# Patient Record
Sex: Female | Born: 1965 | Race: White | Hispanic: No | Marital: Married | Smoking: Never smoker
Health system: Southern US, Community
[De-identification: ages and names within clinical notes are randomized; demographics above are authoritative.]

## PROBLEM LIST (undated history)

## (undated) DIAGNOSIS — E785 Hyperlipidemia, unspecified: Secondary | ICD-10-CM

## (undated) DIAGNOSIS — G43909 Migraine, unspecified, not intractable, without status migrainosus: Secondary | ICD-10-CM

## (undated) DIAGNOSIS — R0600 Dyspnea, unspecified: Secondary | ICD-10-CM

## (undated) DIAGNOSIS — R0602 Shortness of breath: Secondary | ICD-10-CM

## (undated) DIAGNOSIS — K2100 Gastro-esophageal reflux disease with esophagitis, without bleeding: Secondary | ICD-10-CM

## (undated) DIAGNOSIS — J45909 Unspecified asthma, uncomplicated: Secondary | ICD-10-CM

## (undated) DIAGNOSIS — N920 Excessive and frequent menstruation with regular cycle: Secondary | ICD-10-CM

## (undated) DIAGNOSIS — R928 Other abnormal and inconclusive findings on diagnostic imaging of breast: Secondary | ICD-10-CM

## (undated) DIAGNOSIS — I10 Essential (primary) hypertension: Secondary | ICD-10-CM

## (undated) HISTORY — DX: Excessive and frequent menstruation with regular cycle: N92.0

## (undated) HISTORY — DX: Hyperlipidemia, unspecified: E78.5

## (undated) HISTORY — DX: Gastro-esophageal reflux disease with esophagitis: K21.0

## (undated) HISTORY — PX: EYE SURGERY: SHX253

## (undated) HISTORY — PX: CATARACT EXTRACTION, BILATERAL: SHX1313

## (undated) HISTORY — DX: Gastro-esophageal reflux disease with esophagitis, without bleeding: K21.00

## (undated) HISTORY — DX: Unspecified asthma, uncomplicated: J45.909

## (undated) HISTORY — DX: Other abnormal and inconclusive findings on diagnostic imaging of breast: R92.8

## (undated) HISTORY — PX: TUBAL LIGATION: SHX77

## (undated) HISTORY — PX: COLON SURGERY: SHX602

## (undated) HISTORY — DX: Essential (primary) hypertension: I10

## (undated) HISTORY — DX: Migraine, unspecified, not intractable, without status migrainosus: G43.909

---

## 2002-09-19 HISTORY — PX: PARTIAL HYSTERECTOMY: SHX80

## 2002-09-19 HISTORY — PX: ABDOMINAL HYSTERECTOMY: SHX81

## 2004-11-24 ENCOUNTER — Ambulatory Visit: Payer: Self-pay | Admitting: Surgery

## 2005-01-13 ENCOUNTER — Ambulatory Visit: Payer: Self-pay | Admitting: Surgery

## 2005-06-14 ENCOUNTER — Ambulatory Visit: Payer: Self-pay | Admitting: General Practice

## 2007-10-18 ENCOUNTER — Ambulatory Visit: Payer: Self-pay

## 2008-07-16 ENCOUNTER — Ambulatory Visit: Payer: Self-pay

## 2008-09-19 HISTORY — PX: BREAST EXCISIONAL BIOPSY: SUR124

## 2009-06-11 ENCOUNTER — Ambulatory Visit: Payer: Self-pay

## 2009-06-18 ENCOUNTER — Ambulatory Visit: Payer: Self-pay

## 2009-12-16 ENCOUNTER — Ambulatory Visit: Payer: Self-pay

## 2010-01-28 ENCOUNTER — Emergency Department: Payer: Self-pay | Admitting: Emergency Medicine

## 2010-01-30 ENCOUNTER — Emergency Department: Payer: Self-pay | Admitting: Emergency Medicine

## 2010-07-07 ENCOUNTER — Ambulatory Visit: Payer: Self-pay

## 2011-07-18 ENCOUNTER — Ambulatory Visit: Payer: Self-pay

## 2012-07-18 ENCOUNTER — Ambulatory Visit: Payer: Self-pay

## 2012-11-20 DIAGNOSIS — L709 Acne, unspecified: Secondary | ICD-10-CM | POA: Insufficient documentation

## 2012-11-20 DIAGNOSIS — G4700 Insomnia, unspecified: Secondary | ICD-10-CM | POA: Insufficient documentation

## 2012-11-20 DIAGNOSIS — J9801 Acute bronchospasm: Secondary | ICD-10-CM | POA: Insufficient documentation

## 2012-11-20 DIAGNOSIS — Z8249 Family history of ischemic heart disease and other diseases of the circulatory system: Secondary | ICD-10-CM | POA: Insufficient documentation

## 2013-07-19 ENCOUNTER — Ambulatory Visit: Payer: Self-pay

## 2014-05-01 ENCOUNTER — Ambulatory Visit: Payer: Self-pay | Admitting: Family Medicine

## 2015-04-20 ENCOUNTER — Ambulatory Visit
Admission: RE | Admit: 2015-04-20 | Discharge: 2015-04-20 | Disposition: A | Discharge status: Home / Self Care | Payer: BLUE CROSS/BLUE SHIELD | Source: Ambulatory Visit | Attending: Nurse Practitioner | Admitting: Nurse Practitioner

## 2015-04-20 ENCOUNTER — Other Ambulatory Visit: Payer: Self-pay | Admitting: Nurse Practitioner

## 2015-04-20 DIAGNOSIS — M25511 Pain in right shoulder: Secondary | ICD-10-CM | POA: Insufficient documentation

## 2015-04-20 DIAGNOSIS — M47892 Other spondylosis, cervical region: Secondary | ICD-10-CM | POA: Insufficient documentation

## 2015-04-20 DIAGNOSIS — M542 Cervicalgia: Secondary | ICD-10-CM

## 2015-08-03 ENCOUNTER — Other Ambulatory Visit: Payer: Self-pay | Admitting: *Deleted

## 2015-08-03 ENCOUNTER — Inpatient Hospital Stay
Admission: RE | Admit: 2015-08-03 | Discharge: 2015-08-03 | Disposition: A | Discharge status: Home / Self Care | Payer: Self-pay | Source: Ambulatory Visit | Attending: *Deleted | Admitting: *Deleted

## 2015-08-03 ENCOUNTER — Other Ambulatory Visit: Payer: Self-pay | Admitting: Unknown Physician Specialty

## 2015-08-03 DIAGNOSIS — R928 Other abnormal and inconclusive findings on diagnostic imaging of breast: Secondary | ICD-10-CM

## 2015-08-03 DIAGNOSIS — Z9289 Personal history of other medical treatment: Secondary | ICD-10-CM

## 2015-08-05 LAB — HM PAP SMEAR: HM Pap smear: NEGATIVE

## 2015-08-20 ENCOUNTER — Ambulatory Visit
Admission: RE | Admit: 2015-08-20 | Discharge: 2015-08-20 | Disposition: A | Discharge status: Home / Self Care | Payer: BLUE CROSS/BLUE SHIELD | Source: Ambulatory Visit | Attending: Unknown Physician Specialty | Admitting: Unknown Physician Specialty

## 2015-08-20 DIAGNOSIS — R928 Other abnormal and inconclusive findings on diagnostic imaging of breast: Secondary | ICD-10-CM

## 2015-08-20 DIAGNOSIS — R921 Mammographic calcification found on diagnostic imaging of breast: Secondary | ICD-10-CM | POA: Insufficient documentation

## 2015-08-24 ENCOUNTER — Other Ambulatory Visit: Payer: Self-pay | Admitting: Unknown Physician Specialty

## 2015-08-24 DIAGNOSIS — R921 Mammographic calcification found on diagnostic imaging of breast: Secondary | ICD-10-CM

## 2015-08-26 ENCOUNTER — Ambulatory Visit
Admission: RE | Admit: 2015-08-26 | Discharge: 2015-08-26 | Disposition: A | Discharge status: Home / Self Care | Payer: BLUE CROSS/BLUE SHIELD | Source: Ambulatory Visit | Attending: Unknown Physician Specialty | Admitting: Unknown Physician Specialty

## 2015-08-26 DIAGNOSIS — R921 Mammographic calcification found on diagnostic imaging of breast: Secondary | ICD-10-CM

## 2015-08-26 HISTORY — PX: BREAST BIOPSY: SHX20

## 2015-08-27 LAB — SURGICAL PATHOLOGY

## 2016-09-16 ENCOUNTER — Other Ambulatory Visit: Payer: Self-pay | Admitting: Obstetrics & Gynecology

## 2016-09-16 DIAGNOSIS — Z1231 Encounter for screening mammogram for malignant neoplasm of breast: Secondary | ICD-10-CM

## 2016-09-19 HISTORY — PX: EYE SURGERY: SHX253

## 2016-10-04 ENCOUNTER — Ambulatory Visit: Payer: BLUE CROSS/BLUE SHIELD

## 2016-10-26 ENCOUNTER — Ambulatory Visit
Admission: RE | Admit: 2016-10-26 | Discharge: 2016-10-26 | Disposition: A | Discharge status: Home / Self Care | Payer: BLUE CROSS/BLUE SHIELD | Source: Ambulatory Visit | Attending: Obstetrics & Gynecology | Admitting: Obstetrics & Gynecology

## 2016-10-26 DIAGNOSIS — Z1231 Encounter for screening mammogram for malignant neoplasm of breast: Secondary | ICD-10-CM | POA: Diagnosis not present

## 2016-10-26 LAB — HM MAMMOGRAPHY

## 2017-05-31 HISTORY — PX: OTHER SURGICAL HISTORY: SHX169

## 2017-09-04 ENCOUNTER — Other Ambulatory Visit: Payer: Self-pay

## 2017-09-07 NOTE — Telephone Encounter (Signed)
Left pt message that she needs to make appointment to get changes in her medication.  dbs

## 2017-09-08 ENCOUNTER — Ambulatory Visit (INDEPENDENT_AMBULATORY_CARE_PROVIDER_SITE_OTHER): Payer: BLUE CROSS/BLUE SHIELD | Admitting: Nurse Practitioner

## 2017-09-08 ENCOUNTER — Encounter: Payer: Self-pay | Admitting: Obstetrics & Gynecology

## 2017-09-08 ENCOUNTER — Encounter: Payer: Self-pay | Admitting: Nurse Practitioner

## 2017-09-08 VITALS — BP 131/91 | HR 85 | Resp 16 | Ht 69.0 in | Wt 224.4 lb

## 2017-09-08 DIAGNOSIS — E049 Nontoxic goiter, unspecified: Secondary | ICD-10-CM

## 2017-09-08 DIAGNOSIS — E069 Thyroiditis, unspecified: Secondary | ICD-10-CM

## 2017-09-08 DIAGNOSIS — N959 Unspecified menopausal and perimenopausal disorder: Secondary | ICD-10-CM | POA: Diagnosis not present

## 2017-09-08 MED ORDER — ESTRADIOL 0.05 MG/24HR TD PTTW: 1.0000 | MEDICATED_PATCH | TRANSDERMAL | 12 refills | Status: DC

## 2017-09-08 NOTE — Progress Notes (Signed)
Subjective:     Patient ID: Colleen Richard, female   DOB: 11-03-1965, 51 y.o.   MRN: [id number]  Started having night sweats again. 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 Having sweats about every 2 hours at night. Now starting to have hot flashes during the day as well. Currently, does not have prescriptoin for hormone patches.  Would like to go back on weight loss medication. Feels like she may be relased to participate in exercise after her follow up with retina specialist. Feels like she may have put on 15 pounds since this whole situation started.     Current Outpatient Medications:  .  albuterol (PROVENTIL HFA;VENTOLIN HFA) 108 (90 Base) MCG/ACT inhaler, Inhale 2 puffs into the lungs every 6 (six) hours as needed for wheezing or shortness of breath., Disp: , Rfl:  .  aspirin-acetaminophen-caffeine (EXCEDRIN MIGRAINE) 250-250-65 MG tablet, Take 2 tablets by mouth every 6 (six) hours as needed for headache., Disp: , Rfl:  .  phentermine 37.5 MG capsule, Take 37.5 mg by mouth every morning., Disp: , Rfl:  .  estradiol (VIVELLE-DOT) 0.05 MG/24HR patch, Place 1 patch (0.05 mg total) onto the skin 2 (two) times a week., Disp: 8 patch, Rfl: 12  Review of Systems  Constitutional: Positive for fatigue.  HENT: Negative.   Eyes: Negative.   Respiratory: Negative.  Negative for chest tightness and shortness of breath.   Cardiovascular: Negative for chest pain and palpitations.  Gastrointestinal: Negative.   Endocrine: Positive for heat intolerance.  Genitourinary: Negative.        Hot flashes and night sweats .  Musculoskeletal: Negative.   Skin: Negative.   Allergic/Immunologic: Negative.   Neurological: Negative.   Hematological: Negative.   Psychiatric/Behavioral: Negative.     Today's Vitals   09/08/17 1530  BP: (!) 131/91  Pulse: 85  Resp: 16  SpO2: 97%  Weight: 224 lb 6.4 oz (101.8 kg)  Height: 5\' 9"  (1.753 m)   Objective:   Physical Exam   Constitutional: She is oriented to person, place, and time. She appears well-developed and well-nourished.  HENT:  Head: Normocephalic and atraumatic.  Eyes: Pupils are equal, round, and reactive to light.  Neck: Normal range of motion. Neck supple. Thyromegaly present.  Cardiovascular: Normal rate and regular rhythm.  Pulmonary/Chest: Effort normal and breath sounds normal.  Abdominal: Soft. There is no tenderness.  Musculoskeletal: Normal range of motion.  Neurological: She is alert and oriented to person, place, and time.  Skin: Skin is warm and dry.  Psychiatric: She has a normal mood and affect.  Nursing note and vitals reviewed.      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 Add vicelle dot 0.05mg /hr every 24 hours. Reassess at next visit   2. U/s thyroid ordered for continued evaluation and monitoring  Follow up 1 month

## 2017-09-15 ENCOUNTER — Encounter: Payer: Self-pay | Admitting: Obstetrics & Gynecology

## 2017-09-18 ENCOUNTER — Ambulatory Visit (INDEPENDENT_AMBULATORY_CARE_PROVIDER_SITE_OTHER): Payer: BLUE CROSS/BLUE SHIELD | Admitting: Obstetrics & Gynecology

## 2017-09-18 ENCOUNTER — Encounter: Payer: Self-pay | Admitting: Obstetrics & Gynecology

## 2017-09-18 VITALS — BP 130/80 | HR 82 | Ht 69.0 in | Wt 223.0 lb

## 2017-09-18 DIAGNOSIS — Z1231 Encounter for screening mammogram for malignant neoplasm of breast: Secondary | ICD-10-CM

## 2017-09-18 DIAGNOSIS — Z1211 Encounter for screening for malignant neoplasm of colon: Secondary | ICD-10-CM

## 2017-09-18 DIAGNOSIS — Z1239 Encounter for other screening for malignant neoplasm of breast: Secondary | ICD-10-CM

## 2017-09-18 DIAGNOSIS — Z01419 Encounter for gynecological examination (general) (routine) without abnormal findings: Secondary | ICD-10-CM

## 2017-09-18 DIAGNOSIS — Z0000 Encounter for general adult medical examination without abnormal findings: Secondary | ICD-10-CM

## 2017-09-18 NOTE — Progress Notes (Signed)
HPI:      [name] is a 51 y.o. [id number] who LMP was in the past, she presents today for her annual examination.  The patient has no complaints today. The patient is sexually active. Herlast pap: approximate date 2016 and was normal and last mammogram: approximate date 10/2016 and was normal.  The patient does perform self breast exams.  There is no notable family history of breast or ovarian cancer in her family. The patient is taking hormone replacement therapy. Patient denies post-menopausal vaginal bleeding.   The patient has regular exercise: yes. The patient denies current symptoms of depression.    GYN Hx: Last Colonoscopy:not yet Last DEXA: never ago.  PMHx: Past Medical History:  Diagnosis Date  . Abnormal mammogram   . Asthma   . Menorrhagia   . Migraines   . Reflux esophagitis    Past Surgical History:  Procedure Laterality Date  . BREAST BIOPSY Bilateral 2010   excisional - neg  . BREAST BIOPSY Right 08/26/2015   stereo  . CATARACT EXTRACTION, BILATERAL  01/2013, 2015  . COLON SURGERY    . EYE SURGERY    . 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 PARTIAL HYSTERECTOMY  2004  . TUBAL LIGATION     Family History  Problem Relation Age of Onset  . COPD Mother   . Asthma Mother   . Atrial fibrillation Mother   . Heart disease Father   . Alzheimer's disease Maternal Grandmother   . Alzheimer's disease Maternal Aunt   . Breast cancer Neg Hx    Social History   Tobacco Use  . Smoking status: Never Smoker  . Smokeless tobacco: Never Used  Substance Use Topics  . Alcohol use: No    Frequency: Never  . Drug use: No    Current Outpatient Medications:  .  albuterol (PROVENTIL HFA;VENTOLIN HFA) 108 (90 Base) MCG/ACT inhaler, Inhale 2 puffs into the lungs every 6 (six) hours as needed for wheezing or shortness of breath., Disp: , Rfl:  .  aspirin-acetaminophen-caffeine (EXCEDRIN MIGRAINE) 250-250-65 MG tablet, Take 2 tablets by mouth every  6 (six) hours as needed for headache., Disp: , Rfl:  .  estradiol (VIVELLE-DOT) 0.05 MG/24HR patch, Place 1 patch (0.05 mg total) onto the skin 2 (two) times a week., Disp: 8 patch, Rfl: 12 .  phentermine 37.5 MG capsule, Take 37.5 mg by mouth every morning., Disp: , Rfl:  Allergies: Patient has no known allergies.  Review of Systems  Constitutional: Negative for chills, fever and malaise/fatigue.  HENT: Negative for congestion, sinus pain and sore throat.   Eyes: Negative for blurred vision and pain.  Respiratory: Negative for cough and wheezing.   Cardiovascular: Negative for chest pain and leg swelling.  Gastrointestinal: Negative for abdominal pain, constipation, diarrhea, heartburn, nausea and vomiting.  Genitourinary: Negative for dysuria, frequency, hematuria and urgency.  Musculoskeletal: Negative for back pain, joint pain, myalgias and neck pain.  Skin: Negative for itching and rash.  Neurological: Negative for dizziness, tremors and weakness.  Endo/Heme/Allergies: Does not bruise/bleed easily.  Psychiatric/Behavioral: Negative for depression. The patient is not nervous/anxious and does not have insomnia.     Objective: BP 130/80   Pulse 82   Ht 5\' 9"  (1.753 m)   Wt 223 lb (101.2 kg)   BMI 32.93 kg/m   Filed Weights   09/18/17 1321  Weight: 223 lb (101.2 kg)   Body mass index is 32.93 kg/m. Physical Exam  Constitutional: She is oriented to person, place, and time. She appears well-developed and well-nourished. No distress.  Genitourinary: Rectum normal and vagina normal. Pelvic exam was performed with patient supine. There is no rash or lesion on the right labia. There is no rash or lesion on the left labia. Vagina exhibits no lesion. No bleeding in the vagina. Right adnexum does not display mass and does not display tenderness. Left adnexum does not display mass and does not display tenderness.  Genitourinary Comments: Absent Uterus Absent cervix Vaginal cuff well  healed  HENT:  Head: Normocephalic and atraumatic. Head is without laceration.  Right Ear: Hearing normal.  Left Ear: Hearing normal.  Nose: No epistaxis.  No foreign bodies.  Mouth/Throat: Uvula is midline, oropharynx is clear and moist and mucous membranes are normal.  Eyes: Pupils are equal, round, and reactive to light.  Neck: Normal range of motion. Neck supple. No thyromegaly present.  Cardiovascular: Normal rate and regular rhythm. Exam reveals no gallop and no friction rub.  No murmur heard. Pulmonary/Chest: Effort normal and breath sounds normal. No respiratory distress. She has no wheezes. Right breast exhibits no mass, no skin change and no tenderness. Left breast exhibits no mass, no skin change and no tenderness.  Abdominal: Soft. Bowel sounds are normal. She exhibits no distension. There is no tenderness. There is no rebound.  Musculoskeletal: Normal range of motion.  Neurological: She is alert and oriented to person, place, and time. No cranial nerve deficit.  Skin: Skin is warm and dry.  Psychiatric: She has a normal mood and affect. Judgment normal.  Vitals reviewed.  Assessment: Annual Exam 1. Screening for breast cancer   2. Screen for colon cancer   3. Annual physical exam    Plan:            1.  Cervical Screening-  Pap smear schedule reviewed with patient  2. Breast screening- Exam annually and mammogram scheduled  3. Colonoscopy every 10 years (referral made), Hemoccult testing after age 51  4. Labs managed by PCP  5. Counseling for hormonal therapy: no change in therapy today; cont ERT patch    F/U  Return in about 1 year (around 09/18/2018) for Annual.  Barnett Applebaum, MD, [facility], [facility] 09/18/2017  1:42 PM

## 2017-09-18 NOTE — Patient Instructions (Signed)
PAP every three years Mammogram every year    Call [phone] to schedule at [place] Colonoscopy every 10 years Labs yearly (with PCP) 

## 2017-10-02 ENCOUNTER — Telehealth: Payer: Self-pay

## 2017-10-02 NOTE — Telephone Encounter (Signed)
Gastroenterology Pre-Procedure Review  Request Date: Requesting Physician: Dr. Bonna Gains  PATIENT REVIEW QUESTIONS: The patient responded to the following health history questions as indicated:    1. Are you having any GI issues?  2. Do you have a personal history of Polyps? No  3. Do you have a family history of Colon Cancer or Polyps? No  4. Diabetes Mellitus?  No  5. Joint replacements in the past 12 months? No  6. Major health problems in the past 3 months? Yes, detached retina 05/2017 7. Any artificial heart valves, MVP, or defibrillator? No     MEDICATIONS & ALLERGIES:    Patient reports the following regarding taking any anticoagulation/antiplatelet therapy:   Plavix, Coumadin, Eliquis, Xarelto, Lovenox, Pradaxa, Brilinta, or Effient? No  Aspirin? No   Patient confirms/reports the following medications:  Current Outpatient Medications  Medication Sig Dispense Refill  . albuterol (PROVENTIL HFA;VENTOLIN HFA) 108 (90 Base) MCG/ACT inhaler Inhale 2 puffs into the lungs every 6 (six) hours as needed for wheezing or shortness of breath.    Marland Kitchen aspirin-acetaminophen-caffeine (EXCEDRIN MIGRAINE) 250-250-65 MG tablet Take 2 tablets by mouth every 6 (six) hours as needed for headache.    . estradiol (VIVELLE-DOT) 0.05 MG/24HR patch Place 1 patch (0.05 mg total) onto the skin 2 (two) times a week. 8 patch 12  . phentermine 37.5 MG capsule Take 37.5 mg by mouth every morning.     No current facility-administered medications for this visit.     Patient confirms/reports the following allergies:  No Known Allergies  No orders of the defined types were placed in this encounter.   AUTHORIZATION INFORMATION Primary Insurance: 1D#: Group #:  Secondary Insurance: 1D#: Group #:  SCHEDULE INFORMATION: Date: 11/03/17 Time: Location: [place]

## 2017-10-06 ENCOUNTER — Other Ambulatory Visit: Payer: Self-pay

## 2017-10-06 ENCOUNTER — Telehealth: Payer: Self-pay

## 2017-10-06 DIAGNOSIS — Z1211 Encounter for screening for malignant neoplasm of colon: Secondary | ICD-10-CM

## 2017-10-06 NOTE — Telephone Encounter (Signed)
Gastroenterology Pre-Procedure Review  Request Date: 11/03/17 Requesting Physician: Dr. Bonna Gains  PATIENT REVIEW QUESTIONS: The patient responded to the following health history questions as indicated:    1. Are you having any GI issues? no 2. Do you have a personal history of Polyps? no 3. Do you have a family history of Colon Cancer or Polyps? no 4. Diabetes Mellitus? no 5. Joint replacements in the past 12 months?no 6. Major health problems in the past 3 months?yes (05/31/17 retinal detachment) 7. Any artificial heart valves, MVP, or defibrillator?no    MEDICATIONS & ALLERGIES:    Patient reports the following regarding taking any anticoagulation/antiplatelet therapy:   Plavix, Coumadin, Eliquis, Xarelto, Lovenox, Pradaxa, Brilinta, or Effient? no Aspirin? no  Patient confirms/reports the following medications:  Current Outpatient Medications  Medication Sig Dispense Refill  . albuterol (PROVENTIL HFA;VENTOLIN HFA) 108 (90 Base) MCG/ACT inhaler Inhale 2 puffs into the lungs every 6 (six) hours as needed for wheezing or shortness of breath.    Marland Kitchen aspirin-acetaminophen-caffeine (EXCEDRIN MIGRAINE) 250-250-65 MG tablet Take 2 tablets by mouth every 6 (six) hours as needed for headache.    . estradiol (VIVELLE-DOT) 0.05 MG/24HR patch Place 1 patch (0.05 mg total) onto the skin 2 (two) times a week. 8 patch 12  . phentermine 37.5 MG capsule Take 37.5 mg by mouth every morning.     No current facility-administered medications for this visit.     Patient confirms/reports the following allergies:  No Known Allergies  No orders of the defined types were placed in this encounter.   AUTHORIZATION INFORMATION Primary Insurance: 1D#: Group #:  Secondary Insurance: 1D#: Group #:  SCHEDULE INFORMATION: Date: 11/03/17 Time: Location:[facility]

## 2017-10-09 ENCOUNTER — Other Ambulatory Visit: Payer: BLUE CROSS/BLUE SHIELD

## 2017-10-09 DIAGNOSIS — E049 Nontoxic goiter, unspecified: Secondary | ICD-10-CM | POA: Diagnosis not present

## 2017-10-09 DIAGNOSIS — E069 Thyroiditis, unspecified: Secondary | ICD-10-CM | POA: Diagnosis not present

## 2017-10-16 ENCOUNTER — Ambulatory Visit (INDEPENDENT_AMBULATORY_CARE_PROVIDER_SITE_OTHER): Payer: BLUE CROSS/BLUE SHIELD | Admitting: Nurse Practitioner

## 2017-10-16 ENCOUNTER — Encounter: Payer: Self-pay | Admitting: Nurse Practitioner

## 2017-10-16 ENCOUNTER — Telehealth: Payer: Self-pay

## 2017-10-16 VITALS — BP 138/80 | HR 78 | Resp 16 | Ht 69.0 in | Wt 220.0 lb

## 2017-10-16 DIAGNOSIS — N959 Unspecified menopausal and perimenopausal disorder: Secondary | ICD-10-CM

## 2017-10-16 DIAGNOSIS — M19011 Primary osteoarthritis, right shoulder: Secondary | ICD-10-CM

## 2017-10-16 DIAGNOSIS — J0140 Acute pansinusitis, unspecified: Secondary | ICD-10-CM | POA: Diagnosis not present

## 2017-10-16 DIAGNOSIS — E040 Nontoxic diffuse goiter: Secondary | ICD-10-CM

## 2017-10-16 MED ORDER — PREDNISONE 10 MG (21) PO TBPK: ORAL_TABLET | ORAL | 0 refills | Status: DC

## 2017-10-16 MED ORDER — AZITHROMYCIN 250 MG PO TABS: ORAL_TABLET | ORAL | 0 refills | Status: DC

## 2017-10-16 NOTE — Progress Notes (Addendum)
[facility] [place], [place] [address]  Internal MEDICINE  Office Visit Note  Patient Name: Colleen Richard  [id number]  [id number]  Date of Service: 10/16/2017  Chief Complaint  Patient presents with  . Shoulder Pain    right     Increased vivelle dot at her last visit due to increased hot flashes and night sweats. Increased dose is helping symptoms.  She has also had ultrasound of her thyroid for monitoring purposes since she was last seen. She is here to discuss results.    Shoulder Pain   The pain is present in the right shoulder. This is a recurrent problem. The current episode started in the past 7 days. There has been no history of extremity trauma. The problem occurs intermittently. The problem has been unchanged. The quality of the pain is described as aching. The pain is at a severity of 8/10. The pain is severe. Associated symptoms include a limited range of motion. Pertinent negatives include no fever. The symptoms are aggravated by activity and cold. She has tried cold and NSAIDS for the symptoms. The treatment provided no relief. Family history includes rheumatoid arthritis. Her past medical history is significant for osteoarthritis.    Pt is here for routine follow up.    Current Medication: Outpatient Encounter Medications as of 10/16/2017  Medication Sig  . albuterol (PROVENTIL HFA;VENTOLIN HFA) 108 (90 Base) MCG/ACT inhaler Inhale 2 puffs into the lungs every 6 (six) hours as needed for wheezing or shortness of breath.  Marland Kitchen aspirin-acetaminophen-caffeine (EXCEDRIN MIGRAINE) 250-250-65 MG tablet Take 2 tablets by mouth every 6 (six) hours as needed for headache.  . estradiol (VIVELLE-DOT) 0.05 MG/24HR patch Place 1 patch (0.05 mg total) onto the skin 2 (two) times a week.  . phentermine 37.5 MG capsule Take 37.5 mg by mouth every morning.   No facility-administered encounter medications on file as of 10/16/2017.     Surgical History: Past  Surgical History:  Procedure Laterality Date  . BREAST BIOPSY Bilateral 2010   excisional - neg  . BREAST BIOPSY Right 08/26/2015   stereo  . CATARACT EXTRACTION, BILATERAL  01/2013, 2015  . COLON SURGERY    . EYE SURGERY    . 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 PARTIAL HYSTERECTOMY  2004  . TUBAL LIGATION      Medical History: Past Medical History:  Diagnosis Date  . Abnormal mammogram   . Asthma   . Menorrhagia   . Migraines   . Reflux esophagitis     Family History: Family History  Problem Relation Age of Onset  . COPD Mother   . Asthma Mother   . Atrial fibrillation Mother   . Heart disease Father   . Alzheimer's disease Maternal Grandmother   . Alzheimer's disease Maternal Aunt   . Breast cancer Neg Hx     Social History   Socioeconomic History  . Marital status: Married    Spouse name: Not on file  . Number of children: Not on file  . Years of education: Not on file  . Highest education level: Not on file  Social Needs  . Financial resource strain: Not on file  . Food insecurity - worry: Not on file  . Food insecurity - inability: Not on file  . Transportation needs - medical: Not on file  . Transportation needs - non-medical: Not on file  Occupational History  . Not on file  Tobacco Use  .  Smoking status: Never Smoker  . Smokeless tobacco: Never Used  Substance and Sexual Activity  . Alcohol use: No    Frequency: Never  . Drug use: No  . Sexual activity: Yes  Other Topics Concern  . Not on file  Social History Narrative  . Not on file      Review of Systems  Constitutional: Positive for fatigue. Negative for fever.  HENT: Positive for congestion, postnasal drip, rhinorrhea, sore throat and voice change.   Eyes: Negative.   Respiratory: Positive for cough.   Cardiovascular: Negative for chest pain, palpitations and leg swelling.  Gastrointestinal: Negative for constipation, diarrhea, nausea and vomiting.    Endocrine:       Increased dose of vivelle dot patch. Doing very well with this dosing.   Musculoskeletal: Positive for arthralgias.       Right shoulder pain, taking ibuprofen and aleve has not helped.   Allergic/Immunologic: Positive for environmental allergies.  Neurological: Positive for headaches.  Hematological: Negative for adenopathy. Does not bruise/bleed easily.    Today's Vitals   10/16/17 1015  BP: 138/80  Pulse: 78  Resp: 16  SpO2: 98%  Weight: 220 lb (99.8 kg)  Height: 5\' 9"  (1.753 m)    Physical Exam  Constitutional: She is oriented to person, place, and time. She appears well-developed and well-nourished. No distress.  HENT:  Head: Normocephalic and atraumatic.  Right Ear: Tympanic membrane is erythematous and bulging.  Left Ear: Tympanic membrane, external ear and ear canal normal.  Nose: Rhinorrhea present. Right sinus exhibits frontal sinus tenderness. Left sinus exhibits frontal sinus tenderness.  Mouth/Throat: Posterior oropharyngeal edema present. No oropharyngeal exudate.  Eyes: EOM are normal. Pupils are equal, round, and reactive to light.  Neck: Normal range of motion. Neck supple. No JVD present. No tracheal deviation present. No thyromegaly present.  Cardiovascular: Normal rate, regular rhythm and normal heart sounds. Exam reveals no gallop and no friction rub.  No murmur heard. Pulmonary/Chest: Effort normal and breath sounds normal. No respiratory distress. She has no wheezes. She has no rales. She exhibits no tenderness.  Abdominal: Soft. Bowel sounds are normal. There is no tenderness.  Musculoskeletal:       Arms: Lymphadenopathy:    She has no cervical adenopathy.  Neurological: She is alert and oriented to person, place, and time. No cranial nerve deficit.  Skin: Skin is warm and dry. She is not diaphoretic.  Psychiatric: She has a normal mood and affect. Her behavior is normal. Judgment and thought content normal.  Nursing note and vitals  reviewed.   Assessment/Plan:  1. Primary osteoarthritis of right shoulder Continue to use NSAIDs as needed and as prescribed.  - predniSONE (STERAPRED UNI-PAK 21 TAB) 10 MG (21) TBPK tablet; 6 day taper - take by mouth as directed for 6 days  Dispense: 21 tablet; Refill: 0  2. Acute non-recurrent pansinusitis - azithromycin (ZITHROMAX) 250 MG tablet; z-pack - take as directed for 5 days  Dispense: 6 tablet; Refill: 0 OTC medications should be used to relieve symptoms.   3. Unspecified menopausal and perimenopausal disorder Improved. Continue estrogen patch at 0.05mg /24hours  4. Diffuse goiter -  Reviewed results of thyroid ultrasound. Left lobe nodule measuring 1.8cm at largest diameter and right thyroid lobe has two small nodules at 102mm in diameter. Essentially unchanged from prior ultrasound. Will repeat in 1 year.    General Counseling: artemis koller understanding of the findings of todays visit and agrees with plan of treatment. I have discussed  any further diagnostic evaluation that may be needed or ordered today. We also reviewed her medications today. she has been encouraged to call the office with any questions or concerns that should arise related to todays visit.  This patient was seen by Leretha Pol, FNP- C in Collaboration with Dr Lavera Guise as a part of collaborative care agreement    Time spent: 71 Minutes          Dr Lavera Guise Internal medicine

## 2017-10-16 NOTE — Telephone Encounter (Signed)
Called pt to let her know about thyroid results

## 2017-10-16 NOTE — Telephone Encounter (Signed)
-----   Message from Ronnell Freshwater, NP sent at 10/16/2017  1:49 PM EST ----- Hey. Can you let the patient know that her thyroid ultrasound is stable. right side nodule measuring 1.8cm at largest diameter. Two smaller nodulus on left side, both measuring about 2mm in diameter. All about the same as u/s done I 2017. Will repeat in 1 year. thanks

## 2017-10-27 ENCOUNTER — Encounter: Payer: Self-pay | Admitting: Obstetrics & Gynecology

## 2017-10-27 ENCOUNTER — Ambulatory Visit
Admission: RE | Admit: 2017-10-27 | Discharge: 2017-10-27 | Disposition: A | Discharge status: Home / Self Care | Payer: BLUE CROSS/BLUE SHIELD | Source: Ambulatory Visit | Attending: Obstetrics & Gynecology | Admitting: Obstetrics & Gynecology

## 2017-10-27 DIAGNOSIS — Z1231 Encounter for screening mammogram for malignant neoplasm of breast: Secondary | ICD-10-CM | POA: Insufficient documentation

## 2017-10-27 DIAGNOSIS — Z1239 Encounter for other screening for malignant neoplasm of breast: Secondary | ICD-10-CM

## 2017-10-28 ENCOUNTER — Other Ambulatory Visit: Payer: Self-pay | Admitting: Nurse Practitioner

## 2017-10-28 DIAGNOSIS — M19011 Primary osteoarthritis, right shoulder: Secondary | ICD-10-CM

## 2017-10-30 ENCOUNTER — Telehealth: Payer: Self-pay | Admitting: Gastroenterology

## 2017-10-30 MED ORDER — PREDNISONE 10 MG (21) PO TBPK: ORAL_TABLET | ORAL | 0 refills | Status: DC

## 2017-10-30 NOTE — Telephone Encounter (Signed)
Patient called and is having shoulder pain. PCP put her on steroids. She is having a procedure on Friday and Dr. Marius Ditch said ok to start taking steroid and continue on with procedure.

## 2017-11-02 ENCOUNTER — Encounter: Payer: Self-pay | Admitting: *Deleted

## 2017-11-03 ENCOUNTER — Ambulatory Visit: Payer: BLUE CROSS/BLUE SHIELD | Admitting: Anesthesiology

## 2017-11-03 ENCOUNTER — Encounter
Admission: RE | Disposition: A | Discharge status: Home / Self Care | Payer: Self-pay | Source: Ambulatory Visit | Attending: Gastroenterology

## 2017-11-03 ENCOUNTER — Ambulatory Visit
Admission: RE | Admit: 2017-11-03 | Discharge: 2017-11-03 | Disposition: A | Discharge status: Home / Self Care | Payer: BLUE CROSS/BLUE SHIELD | Source: Ambulatory Visit | Attending: Gastroenterology | Admitting: Gastroenterology

## 2017-11-03 ENCOUNTER — Encounter: Payer: Self-pay | Admitting: Anesthesiology

## 2017-11-03 DIAGNOSIS — K529 Noninfective gastroenteritis and colitis, unspecified: Secondary | ICD-10-CM | POA: Insufficient documentation

## 2017-11-03 DIAGNOSIS — J45909 Unspecified asthma, uncomplicated: Secondary | ICD-10-CM | POA: Diagnosis not present

## 2017-11-03 DIAGNOSIS — Z6832 Body mass index (BMI) 32.0-32.9, adult: Secondary | ICD-10-CM | POA: Insufficient documentation

## 2017-11-03 DIAGNOSIS — Z79899 Other long term (current) drug therapy: Secondary | ICD-10-CM | POA: Insufficient documentation

## 2017-11-03 DIAGNOSIS — K6289 Other specified diseases of anus and rectum: Secondary | ICD-10-CM | POA: Diagnosis not present

## 2017-11-03 DIAGNOSIS — Z8249 Family history of ischemic heart disease and other diseases of the circulatory system: Secondary | ICD-10-CM | POA: Diagnosis not present

## 2017-11-03 DIAGNOSIS — K210 Gastro-esophageal reflux disease with esophagitis: Secondary | ICD-10-CM | POA: Insufficient documentation

## 2017-11-03 DIAGNOSIS — Z1211 Encounter for screening for malignant neoplasm of colon: Secondary | ICD-10-CM | POA: Insufficient documentation

## 2017-11-03 DIAGNOSIS — D122 Benign neoplasm of ascending colon: Secondary | ICD-10-CM | POA: Diagnosis not present

## 2017-11-03 DIAGNOSIS — Z1239 Encounter for other screening for malignant neoplasm of breast: Secondary | ICD-10-CM

## 2017-11-03 DIAGNOSIS — E669 Obesity, unspecified: Secondary | ICD-10-CM | POA: Diagnosis not present

## 2017-11-03 HISTORY — PX: COLONOSCOPY WITH PROPOFOL: SHX5780

## 2017-11-03 SURGERY — COLONOSCOPY WITH PROPOFOL
Anesthesia: General

## 2017-11-03 MED ORDER — PROPOFOL 10 MG/ML IV BOLUS
INTRAVENOUS | Status: AC
  Filled 2017-11-03: qty 20

## 2017-11-03 MED ORDER — LIDOCAINE HCL (PF) 1 % IJ SOLN
INTRAMUSCULAR | Status: AC
  Administered 2017-11-03: 0.3 mL via INTRADERMAL
  Filled 2017-11-03: qty 2

## 2017-11-03 MED ORDER — PROPOFOL 500 MG/50ML IV EMUL
INTRAVENOUS | Status: AC
  Filled 2017-11-03: qty 50

## 2017-11-03 MED ORDER — LIDOCAINE HCL (PF) 1 % IJ SOLN
2.0000 mL | Freq: Once | INTRAMUSCULAR | Status: AC
  Administered 2017-11-03: 0.3 mL via INTRADERMAL

## 2017-11-03 MED ORDER — SPOT INK MARKER SYRINGE KIT
PACK | SUBMUCOSAL | Status: DC | PRN
  Administered 2017-11-03: 4 mL via SUBMUCOSAL

## 2017-11-03 MED ORDER — MIDAZOLAM HCL 2 MG/2ML IJ SOLN
INTRAMUSCULAR | Status: DC | PRN
  Administered 2017-11-03: 2 mg via INTRAVENOUS

## 2017-11-03 MED ORDER — MIDAZOLAM HCL 2 MG/2ML IJ SOLN
INTRAMUSCULAR | Status: AC
  Filled 2017-11-03: qty 2

## 2017-11-03 MED ORDER — FENTANYL CITRATE (PF) 100 MCG/2ML IJ SOLN
INTRAMUSCULAR | Status: DC | PRN
  Administered 2017-11-03: 50 ug via INTRAVENOUS

## 2017-11-03 MED ORDER — PROPOFOL 500 MG/50ML IV EMUL
INTRAVENOUS | Status: DC | PRN
  Administered 2017-11-03: 120 ug/kg/min via INTRAVENOUS

## 2017-11-03 MED ORDER — FENTANYL CITRATE (PF) 100 MCG/2ML IJ SOLN
INTRAMUSCULAR | Status: AC
  Filled 2017-11-03: qty 2

## 2017-11-03 MED ORDER — SODIUM CHLORIDE 0.9 % IV SOLN
INTRAVENOUS | Status: DC
  Administered 2017-11-03: 1000 mL via INTRAVENOUS

## 2017-11-03 NOTE — Anesthesia Post-op Follow-up Note (Signed)
Anesthesia QCDR form completed.        

## 2017-11-03 NOTE — Anesthesia Preprocedure Evaluation (Signed)
Anesthesia Evaluation  Patient identified by MRN, date of birth, ID band Patient awake    Reviewed: Allergy & Precautions, NPO status , Patient's Chart, lab work & pertinent test results, reviewed documented beta blocker date and time   Airway Mallampati: III  TM Distance: >3 FB     Dental  (+) Chipped   Pulmonary asthma ,           Cardiovascular      Neuro/Psych  Headaches,    GI/Hepatic   Endo/Other    Renal/GU      Musculoskeletal   Abdominal   Peds  Hematology   Anesthesia Other Findings Obese.  Reproductive/Obstetrics                             Anesthesia Physical Anesthesia Plan  ASA: II  Anesthesia Plan: General   Post-op Pain Management:    Induction: Intravenous  PONV Risk Score and Plan:   Airway Management Planned:   Additional Equipment:   Intra-op Plan:   Post-operative Plan:   Informed Consent: I have reviewed the patients History and Physical, chart, labs and discussed the procedure including the risks, benefits and alternatives for the proposed anesthesia with the patient or authorized representative who has indicated his/her understanding and acceptance.     Plan Discussed with: CRNA  Anesthesia Plan Comments:         Anesthesia Quick Evaluation

## 2017-11-03 NOTE — Op Note (Signed)
[facility] Gastroenterology Patient Name: Colleen Richard Procedure Date: 11/03/2017 9:33 AM MRN: [id number] Account #: [id number] Date of Birth: 06/30/1966 Admit Type: Outpatient Age: 52 Room: [facility] ENDO ROOM 3 Gender: Female Note Status: Finalized Procedure:            Colonoscopy Indications:          Screening for colorectal malignant neoplasm Providers:            Demita Tobia B. Bonna Gains MD, MD Referring MD:         Forest Gleason Md, MD (Referring MD) Medicines:            Monitored Anesthesia Care Complications:        No immediate complications. Procedure:            Pre-Anesthesia Assessment:                       - ASA Grade Assessment: II - A patient with mild                        systemic disease.                       - Prior to the procedure, a History and Physical was                        performed, and patient medications, allergies and                        sensitivities were reviewed. The patient's tolerance of                        previous anesthesia was reviewed.                       - The risks and benefits of the procedure and the                        sedation options and risks were discussed with the                        patient. All questions were answered and informed                        consent was obtained.                       - Patient identification and proposed procedure were                        verified prior to the procedure by the physician, the                        nurse, the anesthesiologist, the anesthetist and the                        technician. The procedure was verified in the procedure                        room.  After obtaining informed consent, the colonoscope was                        passed under direct vision. Throughout the procedure,                        the patient's blood pressure, pulse, and oxygen                        saturations were monitored continuously. The                 Colonoscope was introduced through the anus and                        advanced to the the cecum, identified by appendiceal                        orifice and ileocecal valve. The colonoscopy was                        performed with ease. The patient tolerated the                        procedure well. The quality of the bowel preparation                        was good except the ascending colon was fair. Findings:      The perianal and digital rectal examinations were normal.      A 5 mm polyp was found in the ascending colon. The polyp was flat. The       polyp was removed with a jumbo cold forceps. Resection and retrieval       were complete. Area was tattooed with an injection of 3 mL of Spot       (carbon black). Due to the position of the polyp at a sharp turn it       could not repositioned to the 6 'o' clock position, to be removed via       cold snare and thus was removed piecemeal with jumbo forceps instead.      A localized area of mildly erythematous mucosa was found in the rectum.       Biopsies were taken with a cold forceps for histology.      Retroflexion could not be performed due to narrow rectum. Careful       frontal view of the rectum did not show any abnormalities besides mild       rectal erythema.      The exam was otherwise without abnormality. Impression:           - One 5 mm polyp in the ascending colon, removed with a                        jumbo cold forceps. Resected and retrieved. Tattooed.                       - Erythematous mucosa in the rectum. Biopsied.                       - Retroflexion could not be performed due to narrow  rectum. Careful frontal view of the rectum did not show                        any abnormalities besides mild rectal erythema.                       - The examination was otherwise normal. Recommendation:       - Discharge patient to home (with escort).                       - Advance diet as  tolerated.                       - Continue present medications.                       - Await pathology results.                       - The findings and recommendations were discussed with                        the patient.                       - The findings and recommendations were discussed with                        the patient's family.                       - Return to primary care physician as previously                        scheduled.                       - Repeat colonoscopy for surveillance based on                        pathology results. Due to fair prep in the right colon                        would recommend no later than 5 years for surveillance. Procedure Code(s):    --- Professional ---                       [phone], Colonoscopy, flexible; with directed submucosal                        injection(s), any substance                       84696, Colonoscopy, flexible; with biopsy, single or                        multiple Diagnosis Code(s):    --- Professional ---                       Z12.11, Encounter for screening for malignant neoplasm                        of colon  D12.2, Benign neoplasm of ascending colon                       K62.89, Other specified diseases of anus and rectum CPT copyright 2016 American Medical Association. All rights reserved. The codes documented in this report are preliminary and upon coder review may  be revised to meet current compliance requirements.  Vonda Antigua, MD Margretta Sidle B. Bonna Gains MD, MD 11/03/2017 11:04:54 AM This report has been signed electronically. Number of Addenda: 0 Note Initiated On: 11/03/2017 9:33 AM Scope Withdrawal Time: 0 hours 52 minutes 11 seconds  Total Procedure Duration: 1 hour 3 minutes 41 seconds  Estimated Blood Loss: Estimated blood loss: none.      [facility]

## 2017-11-03 NOTE — H&P (Signed)
Vonda Antigua, MD [address] [place], [place], [place], [address] [id number], [place], [place], [place], [address] Phone: [phone]  Fax: [phone]  Primary Care Physician:  Ronnell Freshwater, NP   Pre-Procedure History & Physical: HPI:  Colleen Richard is a 52 y.o. female is here for a colonoscopy.   Past Medical History:  Diagnosis Date  . Abnormal mammogram   . Asthma   . Menorrhagia   . Migraines   . Reflux esophagitis     Past Surgical History:  Procedure Laterality Date  . BREAST BIOPSY Bilateral 2010   excisional - neg  . BREAST BIOPSY Right 08/26/2015   stereo  . CATARACT EXTRACTION, BILATERAL  01/2013, 2015  . COLON SURGERY    . EYE SURGERY    . 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 PARTIAL HYSTERECTOMY  2004  . TUBAL LIGATION      Prior to Admission medications   Medication Sig Start Date End Date Taking? Authorizing Provider  albuterol (PROVENTIL HFA;VENTOLIN HFA) 108 (90 Base) MCG/ACT inhaler Inhale 2 puffs into the lungs every 6 (six) hours as needed for wheezing or shortness of breath.    [provider]  aspirin-acetaminophen-caffeine (EXCEDRIN MIGRAINE) [phone] MG tablet Take 2 tablets by mouth every 6 (six) hours as needed for headache.    [provider]  azithromycin (ZITHROMAX) 250 MG tablet z-pack - take as directed for 5 days 10/16/17   Ronnell Freshwater, NP  estradiol (VIVELLE-DOT) 0.05 MG/24HR patch Place 1 patch (0.05 mg total) onto the skin 2 (two) times a week. 09/11/17   Ronnell Freshwater, NP  predniSONE (STERAPRED UNI-PAK 21 TAB) 10 MG (21) TBPK tablet 6 day taper - take by mouth as directed for 6 days 10/30/17   Ronnell Freshwater, NP    Allergies as of 10/09/2017  . (No Known Allergies)    Family History  Problem Relation Age of Onset  . COPD Mother   . Asthma Mother   . Atrial fibrillation Mother   . Heart disease Father   . Alzheimer's disease Maternal Grandmother   .  Alzheimer's disease Maternal Aunt   . Breast cancer Neg Hx     Social History   Socioeconomic History  . Marital status: Married    Spouse name: Not on file  . Number of children: Not on file  . Years of education: Not on file  . Highest education level: Not on file  Social Needs  . Financial resource strain: Not on file  . Food insecurity - worry: Not on file  . Food insecurity - inability: Not on file  . Transportation needs - medical: Not on file  . Transportation needs - non-medical: Not on file  Occupational History  . Not on file  Tobacco Use  . Smoking status: Never Smoker  . Smokeless tobacco: Never Used  Substance and Sexual Activity  . Alcohol use: No    Frequency: Never  . Drug use: No  . Sexual activity: Yes  Other Topics Concern  . Not on file  Social History Narrative  . Not on file    Review of Systems: See HPI, otherwise negative ROS  Physical Exam: BP 127/90   Pulse 67   Temp (!) 96.1 F (35.6 C) (Tympanic)   Resp 17   Ht 5\' 9"  (1.753 m)   Wt 218 lb (98.9 kg)   SpO2 99%   BMI 32.19 kg/m  General:  Alert,  pleasant and cooperative in NAD Head:  Normocephalic and atraumatic. Neck:  Supple; no masses or thyromegaly. Lungs:  Clear throughout to auscultation, normal respiratory effort.    Heart:  +S1, +S2, Regular rate and rhythm, No edema. Abdomen:  Soft, nontender and nondistended. Normal bowel sounds, without guarding, and without rebound.   Neurologic:  Alert and  oriented x4;  grossly normal neurologically.  Impression/Plan: MAYTHE DERAMO is here for a colonoscopy to be performed for average risk screening.  Risks, benefits, limitations, and alternatives regarding  colonoscopy have been reviewed with the patient.  Questions have been answered.  All parties agreeable.   Virgel Manifold, MD  11/03/2017, 9:30 AM

## 2017-11-03 NOTE — Transfer of Care (Signed)
Immediate Anesthesia Transfer of Care Note  Patient: Colleen Richard  Procedure(s) Performed: COLONOSCOPY WITH PROPOFOL (N/A )  Patient Location: PACU  Anesthesia Type:General  Level of Consciousness: awake and sedated  Airway & Oxygen Therapy: Patient Spontanous Breathing and Patient connected to nasal cannula oxygen  Post-op Assessment: Report given to RN and Post -op Vital signs reviewed and stable  Post vital signs: Reviewed and stable  Last Vitals:  Vitals:   11/03/17 0819  BP: 127/90  Pulse: 67  Resp: 17  Temp: (!) 35.6 C  SpO2: 99%    Last Pain:  Vitals:   11/03/17 0819  TempSrc: Tympanic         Complications: No apparent anesthesia complications

## 2017-11-03 NOTE — Anesthesia Procedure Notes (Signed)
Performed by: Cook-Martin, Taylinn Brabant Pre-anesthesia Checklist: Patient identified, Emergency Drugs available, Suction available, Patient being monitored and Timeout performed Patient Re-evaluated:Patient Re-evaluated prior to induction Oxygen Delivery Method: Nasal cannula Preoxygenation: Pre-oxygenation with 100% oxygen Induction Type: IV induction Placement Confirmation: positive ETCO2 and CO2 detector       

## 2017-11-03 NOTE — Anesthesia Postprocedure Evaluation (Signed)
Anesthesia Post Note  Patient: Colleen Richard  Procedure(s) Performed: COLONOSCOPY WITH PROPOFOL (N/A )  Patient location during evaluation: Endoscopy Anesthesia Type: General Level of consciousness: awake and alert Pain management: pain level controlled Vital Signs Assessment: post-procedure vital signs reviewed and stable Respiratory status: spontaneous breathing, nonlabored ventilation, respiratory function stable and patient connected to nasal cannula oxygen Cardiovascular status: blood pressure returned to baseline and stable Postop Assessment: no apparent nausea or vomiting Anesthetic complications: no     Last Vitals:  Vitals:   11/03/17 0819  BP: 127/90  Pulse: 67  Resp: 17  Temp: (!) 35.6 C  SpO2: 99%    Last Pain:  Vitals:   11/03/17 0819  TempSrc: Tympanic                 [name]

## 2017-11-06 ENCOUNTER — Encounter: Payer: Self-pay | Admitting: Gastroenterology

## 2017-11-06 LAB — SURGICAL PATHOLOGY

## 2017-11-13 ENCOUNTER — Other Ambulatory Visit: Payer: Self-pay

## 2017-11-13 ENCOUNTER — Encounter: Payer: Self-pay | Admitting: Gastroenterology

## 2017-11-13 ENCOUNTER — Ambulatory Visit (INDEPENDENT_AMBULATORY_CARE_PROVIDER_SITE_OTHER): Payer: BLUE CROSS/BLUE SHIELD | Admitting: Gastroenterology

## 2017-11-13 VITALS — BP 133/85 | HR 67 | Temp 97.8°F | Ht 69.0 in | Wt 222.8 lb

## 2017-11-13 DIAGNOSIS — K6289 Other specified diseases of anus and rectum: Secondary | ICD-10-CM

## 2017-11-13 NOTE — Progress Notes (Signed)
Vonda Antigua, MD [address]  [place]  [place], [place] [address]  Main: [phone]  Fax: [phone]   Primary Care Physician: Ronnell Freshwater, NP  Primary Gastroenterologist:  Dr. Vonda Antigua  Chief Complaint  Patient presents with  . Follow-up    had colonoscopy on 11/03/17    HPI: Colleen Richard is a 52 y.o. female here for follow-up after colonoscopy.  Patient denies any abdominal pain, diarrhea, blood in stool, weight loss, loss of appetite.  Patient reports on a regular since she has, 1-2 formed to hard bowel movements daily.  About every other month she will have loose stools without blood.  He does not have any chronic history of abdominal pain, abdominal cramping, chronic loose stools or blood streaks in stool.  No family history of IBD or colon cancer.  Reports good appetite.  No fever or chills  Patient had a average risk screening colonoscopy on November 03, 2017.  A flat 5 mm polyp was seen in the ascending colon.  It was removed piecemeal with jumbo forceps.  A tattoo was placed at the site.  Mild rectal erythema was seen and was biopsied.  Biopsy showed sessile serrated adenoma.  Rectal biopsies reported mild active colitis. "Architectural features of  chronicity are minimally present and include crypt irregularities and  focal crypt dropout/erosion. Granulomas are not identified."  I discussed the biopsies with Dr. Quay Burow of pathology, and she stated that the features of chronicity are very minimal, and is not consistent with IBD.  Current Outpatient Medications  Medication Sig Dispense Refill  . albuterol (PROVENTIL HFA;VENTOLIN HFA) 108 (90 Base) MCG/ACT inhaler Inhale 2 puffs into the lungs every 6 (six) hours as needed for wheezing or shortness of breath.    Marland Kitchen aspirin-acetaminophen-caffeine (EXCEDRIN MIGRAINE) 250-250-65 MG tablet Take 2 tablets by mouth every 6 (six) hours as needed for headache.    . estradiol (VIVELLE-DOT)  0.05 MG/24HR patch Place 1 patch (0.05 mg total) onto the skin 2 (two) times a week. 8 patch 12   No current facility-administered medications for this visit.     Allergies as of 11/13/2017  . (No Known Allergies)    ROS:  General: Negative for anorexia, weight loss, fever, chills, fatigue, weakness. ENT: Negative for hoarseness, difficulty swallowing , nasal congestion. CV: Negative for chest pain, angina, palpitations, dyspnea on exertion, peripheral edema.  Respiratory: Negative for dyspnea at rest, dyspnea on exertion, cough, sputum, wheezing.  GI: See history of present illness. GU:  Negative for dysuria, hematuria, urinary incontinence, urinary frequency, nocturnal urination.  Endo: Negative for unusual weight change.    Physical Examination:   BP 133/85   Pulse 67   Temp 97.8 F (36.6 C) (Oral)   Ht '5\' 9"'  (1.753 m)   Wt 222 lb 12.8 oz (101.1 kg)   BMI 32.90 kg/m   General: Well-nourished, well-developed in no acute distress.  Eyes: No icterus. Conjunctivae pink. Mouth: Oropharyngeal mucosa moist and pink , no lesions erythema or exudate. Neck: Supple, Trachea midline Abdomen: Bowel sounds are normal, nontender, nondistended, no hepatosplenomegaly or masses, no abdominal bruits or hernia , no rebound or guarding.   Extremities: No lower extremity edema. No clubbing or deformities. Neuro: Alert and oriented x 3.  Grossly intact. Skin: Warm and dry, no jaundice.   Psych: Alert and cooperative, normal mood and affect.   Labs: CMP  No results found for: NA, K, CL, CO2, GLUCOSE, BUN, CREATININE, CALCIUM, PROT, ALBUMIN, AST, ALT,  ALKPHOS, BILITOT, GFRNONAA, GFRAA No results found for: WBC, HGB, HCT, MCV, PLT  Imaging Studies: Mm Screening Breast Tomo Bilateral  Result Date: 10/27/2017 CLINICAL DATA:  Screening. EXAM: DIGITAL SCREENING BILATERAL MAMMOGRAM WITH TOMO AND CAD COMPARISON:  Previous exam(s). ACR Breast Density Category b: There are scattered areas of  fibroglandular density. FINDINGS: There are no findings suspicious for malignancy. Images were processed with CAD. IMPRESSION: No mammographic evidence of malignancy. A result letter of this screening mammogram will be mailed directly to the patient. RECOMMENDATION: Screening mammogram in one year. (Code:SM-B-01Y) BI-RADS CATEGORY  1: Negative. Electronically Signed   By: Abelardo Diesel M.D.   On: 10/27/2017 09:48    Assessment and Plan:   Colleen Richard is a 52 y.o. y/o female here for follow-up of allergy screening colonoscopy, with mild erythema in the rectum, with biopsies showing mild active colitis, with minimal features of chronicity, and discussion with pathologist revealed that this is unlikely to be IBD  This patient's clinical symptoms, endoscopic findings, and biopsies are not convincing for IBD Minimal erythema seen on rectum and biopsy findings were likely due to prep effect versus constipation The patient does not have any clinical symptoms at this time that are concerning We will obtain blood work including ESR and CRP  Given her piecemeal polypectomy, but I discussed the need for repeat colonoscopy with this patient to reevaluate the site.  Patient would like to wait to schedule the procedure due to insurance issues.  At the time of the repeat colonoscopy, rectal can be biopsied again, along with random biopsies of the colon to compare histology from recent colonoscopy  I will follow-up with her in clinic, and when she is ready we will schedule her for the colonoscopy.  In the meantime, if symptoms change, she develops loose stools, or blood streaks in stools, abdominal pain, or any other reason for concern, I have asked her to contact us immediately.  She verbalized understanding  No alarm symptoms present at this time I have asked her to maintain a high-fiber diet and soft stools daily Advised her to start taking MiraLAX or Metamucil every day or every other day to maintain  soft stool  Dr Vonda Antigua

## 2017-11-13 NOTE — Patient Instructions (Signed)
F/U 6 months  High-Fiber Diet Fiber, also called dietary fiber, is a type of carbohydrate found in fruits, vegetables, whole grains, and beans. A high-fiber diet can have many health benefits. Your health care provider may recommend a high-fiber diet to help:  Prevent constipation. Fiber can make your bowel movements more regular.  Lower your cholesterol.  Relieve hemorrhoids, uncomplicated diverticulosis, or irritable bowel syndrome.  Prevent overeating as part of a weight-loss plan.  Prevent heart disease, type 2 diabetes, and certain cancers.  What is my plan? The recommended daily intake of fiber includes:  38 grams for men under age 51.  93 grams for men over age 56.  32 grams for women under age 77.  10 grams for women over age 5.  You can get the recommended daily intake of dietary fiber by eating a variety of fruits, vegetables, grains, and beans. Your health care provider may also recommend a fiber supplement if it is not possible to get enough fiber through your diet. What do I need to know about a high-fiber diet?  Fiber supplements have not been widely studied for their effectiveness, so it is better to get fiber through food sources.  Always check the fiber content on thenutrition facts label of any prepackaged food. Look for foods that contain at least 5 grams of fiber per serving.  Ask your dietitian if you have questions about specific foods that are related to your condition, especially if those foods are not listed in the following section.  Increase your daily fiber consumption gradually. Increasing your intake of dietary fiber too quickly may cause bloating, cramping, or gas.  Drink plenty of water. Water helps you to digest fiber. What foods can I eat? Grains Whole-grain breads. Multigrain cereal. Oats and oatmeal. Brown rice. Barley. Bulgur wheat. [place]. Bran muffins. Popcorn. Rye wafer crackers. Vegetables Sweet potatoes. Spinach. Kale. Artichokes.  Cabbage. Broccoli. Green peas. Carrots. Squash. Fruits Berries. Pears. Apples. Oranges. Avocados. Prunes and raisins. Dried figs. Meats and Other Protein Sources Navy, kidney, pinto, and soy beans. Split peas. Lentils. Nuts and seeds. Dairy Fiber-fortified yogurt. Beverages Fiber-fortified soy milk. Fiber-fortified orange juice. Other Fiber bars. The items listed above may not be a complete list of recommended foods or beverages. Contact your dietitian for more options. What foods are not recommended? Grains White bread. Pasta made with refined flour. White rice. Vegetables Fried potatoes. Canned vegetables. Well-cooked vegetables. Fruits Fruit juice. Cooked, strained fruit. Meats and Other Protein Sources Fatty cuts of meat. Fried Sales executive or fried fish. Dairy Milk. Yogurt. Cream cheese. Sour cream. Beverages Soft drinks. Other Cakes and pastries. Butter and oils. The items listed above may not be a complete list of foods and beverages to avoid. Contact your dietitian for more information. What are some tips for including high-fiber foods in my diet?  Eat a wide variety of high-fiber foods.  Make sure that half of all grains consumed each day are whole grains.  Replace breads and cereals made from refined flour or white flour with whole-grain breads and cereals.  Replace white rice with brown rice, bulgur wheat, or millet.  Start the day with a breakfast that is high in fiber, such as a cereal that contains at least 5 grams of fiber per serving.  Use beans in place of meat in soups, salads, or pasta.  Eat high-fiber snacks, such as berries, raw vegetables, nuts, or popcorn. This information is not intended to replace advice given to you by your health care provider. Make  sure you discuss any questions you have with your health care provider. Document Released: 09/05/2005 Document Revised: 02/11/2016 Document Reviewed: 02/18/2014 Elsevier Interactive Patient Education   [employer].

## 2017-11-15 LAB — C-REACTIVE PROTEIN: CRP: 5.5 mg/L — AB (ref 0.0–4.9)

## 2017-11-15 LAB — COMPREHENSIVE METABOLIC PANEL
ALK PHOS: 68 IU/L (ref 39–117)
ALT: 13 IU/L (ref 0–32)
AST: 26 IU/L (ref 0–40)
Albumin/Globulin Ratio: 1.5 (ref 1.2–2.2)
Albumin: 3.8 g/dL (ref 3.5–5.5)
BUN/Creatinine Ratio: 18 (ref 9–23)
BUN: 14 mg/dL (ref 6–24)
Bilirubin Total: 0.2 mg/dL (ref 0.0–1.2)
CALCIUM: 9.2 mg/dL (ref 8.7–10.2)
CO2: 20 mmol/L (ref 20–29)
CREATININE: 0.76 mg/dL (ref 0.57–1.00)
Chloride: 104 mmol/L (ref 96–106)
GFR calc Af Amer: 106 mL/min/{1.73_m2} (ref >59)
GFR, EST NON AFRICAN AMERICAN: 92 mL/min/{1.73_m2} (ref >59)
GLOBULIN, TOTAL: 2.5 g/dL (ref 1.5–4.5)
GLUCOSE: 83 mg/dL (ref 65–99)
Potassium: 4.3 mmol/L (ref 3.5–5.2)
SODIUM: 141 mmol/L (ref 134–144)
Total Protein: 6.3 g/dL (ref 6.0–8.5)

## 2017-11-15 LAB — CBC WITH DIFFERENTIAL/PLATELET
BASOS ABS: 0 10*3/uL (ref 0.0–0.2)
Basos: 0 %
EOS (ABSOLUTE): 0.1 10*3/uL (ref 0.0–0.4)
EOS: 1 %
HEMATOCRIT: 39.9 % (ref 34.0–46.6)
Hemoglobin: 13.5 g/dL (ref 11.1–15.9)
Immature Grans (Abs): 0 10*3/uL (ref 0.0–0.1)
Immature Granulocytes: 0 %
LYMPHS ABS: 2.2 10*3/uL (ref 0.7–3.1)
Lymphs: 28 %
MCH: 29.3 pg (ref 26.6–33.0)
MCHC: 33.8 g/dL (ref 31.5–35.7)
MCV: 87 fL (ref 79–97)
MONOS ABS: 0.6 10*3/uL (ref 0.1–0.9)
Monocytes: 8 %
Neutrophils Absolute: 5 10*3/uL (ref 1.4–7.0)
Neutrophils: 63 %
PLATELETS: 225 10*3/uL (ref 150–379)
RBC: 4.6 x10E6/uL (ref 3.77–5.28)
RDW: 13.5 % (ref 12.3–15.4)
WBC: 8 10*3/uL (ref 3.4–10.8)

## 2017-11-15 LAB — SEDIMENTATION RATE: SED RATE: 10 mm/h (ref 0–40)

## 2017-11-17 ENCOUNTER — Encounter: Payer: Self-pay | Admitting: Nurse Practitioner

## 2017-11-23 ENCOUNTER — Telehealth: Payer: Self-pay

## 2017-11-23 NOTE — Telephone Encounter (Signed)
Patient called req appt for shoulder pain bc ortho is not covered under her insurance and wants to see [facility] for primary first, advised pt [facility] didn't have anything for a few weekd and offered [facility] appt but pt didn't take it and will possibly find another pcp, also pt not happy of not getting returned messages on [facility] tried to explain our office is still new to [facility] and trying to learn the system/ BR

## 2017-12-19 DIAGNOSIS — M5030 Other cervical disc degeneration, unspecified cervical region: Secondary | ICD-10-CM | POA: Insufficient documentation

## 2017-12-19 DIAGNOSIS — E669 Obesity, unspecified: Secondary | ICD-10-CM | POA: Insufficient documentation

## 2017-12-30 ENCOUNTER — Other Ambulatory Visit: Payer: Self-pay | Admitting: Nurse Practitioner

## 2017-12-30 DIAGNOSIS — M19011 Primary osteoarthritis, right shoulder: Secondary | ICD-10-CM

## 2017-12-30 MED ORDER — PREDNISONE 10 MG (21) PO TBPK: ORAL_TABLET | ORAL | 0 refills | Status: DC

## 2017-12-30 NOTE — Progress Notes (Signed)
Patient states she is still having shoulder pain. Will repeat prednisone taper. Sent to [facility].

## 2018-01-25 ENCOUNTER — Other Ambulatory Visit: Payer: Self-pay | Admitting: Internal Medicine

## 2018-01-25 DIAGNOSIS — E049 Nontoxic goiter, unspecified: Secondary | ICD-10-CM

## 2018-01-25 NOTE — Progress Notes (Signed)
THYR

## 2018-02-09 ENCOUNTER — Other Ambulatory Visit: Payer: Self-pay

## 2018-02-23 ENCOUNTER — Other Ambulatory Visit: Payer: Self-pay

## 2018-02-23 DIAGNOSIS — M19011 Primary osteoarthritis, right shoulder: Secondary | ICD-10-CM

## 2018-02-23 MED ORDER — PREDNISONE 10 MG (21) PO TBPK: ORAL_TABLET | ORAL | 0 refills | Status: DC

## 2018-04-17 ENCOUNTER — Ambulatory Visit: Payer: Self-pay | Admitting: Nurse Practitioner

## 2018-04-27 ENCOUNTER — Ambulatory Visit: Payer: Self-pay | Admitting: Nurse Practitioner

## 2018-04-30 ENCOUNTER — Encounter: Payer: Self-pay | Admitting: Nurse Practitioner

## 2018-04-30 ENCOUNTER — Ambulatory Visit: Payer: Managed Care, Other (non HMO) | Admitting: Nurse Practitioner

## 2018-04-30 VITALS — BP 147/89 | HR 67 | Resp 16 | Ht 69.0 in | Wt 227.2 lb

## 2018-04-30 DIAGNOSIS — E040 Nontoxic diffuse goiter: Secondary | ICD-10-CM | POA: Diagnosis not present

## 2018-04-30 DIAGNOSIS — E668 Other obesity: Secondary | ICD-10-CM

## 2018-04-30 DIAGNOSIS — E559 Vitamin D deficiency, unspecified: Secondary | ICD-10-CM | POA: Diagnosis not present

## 2018-04-30 DIAGNOSIS — Z0001 Encounter for general adult medical examination with abnormal findings: Secondary | ICD-10-CM

## 2018-04-30 DIAGNOSIS — N959 Unspecified menopausal and perimenopausal disorder: Secondary | ICD-10-CM

## 2018-04-30 DIAGNOSIS — M19011 Primary osteoarthritis, right shoulder: Secondary | ICD-10-CM

## 2018-04-30 MED ORDER — PHENTERMINE HCL 37.5 MG PO TABS
37.5000 mg | ORAL_TABLET | Freq: Every day | ORAL | 1 refills | Status: DC
Start: 2018-04-30 — End: 2018-06-18

## 2018-04-30 NOTE — Progress Notes (Signed)
[facility] [place], [place] [address]  Internal MEDICINE  Office Visit Note  Patient Name: Colleen Richard  [id number]  [id number]  Date of Service: 05/13/2018  Chief Complaint  Patient presents with  . Osteoarthritis    right shoulder 6 month follow up  . Weight Gain    7 pounds since most recent viisit.     The patient does have pain, intermittently, in her right shoulder. Exertion will make the pain more severe. When pain acts up, pain improves after taking prednisone taper.  She would like to restart appetite suppressant. Has done well on phentermine in the past. In addition to appetite suppressant, she is able to limit calorie intake and increase exercise, to increase weight loss.       Current Medication: Outpatient Encounter Medications as of 04/30/2018  Medication Sig  . albuterol (PROVENTIL HFA;VENTOLIN HFA) 108 (90 Base) MCG/ACT inhaler Inhale 2 puffs into the lungs every 6 (six) hours as needed for wheezing or shortness of breath.  Marland Kitchen aspirin-acetaminophen-caffeine (EXCEDRIN MIGRAINE) 250-250-65 MG tablet Take 2 tablets by mouth every 6 (six) hours as needed for headache.  . estradiol (VIVELLE-DOT) 0.05 MG/24HR patch Place 1 patch (0.05 mg total) onto the skin 2 (two) times a week.  . zolpidem (AMBIEN) 10 MG tablet Take 10 mg by mouth at bedtime as needed for sleep.  . phentermine (ADIPEX-P) 37.5 MG tablet Take 1 tablet (37.5 mg total) by mouth daily before breakfast.  . predniSONE (STERAPRED UNI-PAK 21 TAB) 10 MG (21) TBPK tablet 6 day taper - take by mouth as directed for 6 days (Patient not taking: Reported on 04/30/2018)   No facility-administered encounter medications on file as of 04/30/2018.     Surgical History: Past Surgical History:  Procedure Laterality Date  . BREAST BIOPSY Bilateral 2010   excisional - neg  . BREAST BIOPSY Right 08/26/2015   stereo  . CATARACT EXTRACTION, BILATERAL  01/2013, 2015  . COLON SURGERY    .  COLONOSCOPY WITH PROPOFOL N/A 11/03/2017   Procedure: COLONOSCOPY WITH PROPOFOL;  Surgeon: Virgel Manifold, MD;  Location: [facility] ENDOSCOPY;  Service: Endoscopy;  Laterality: N/A;  . EYE SURGERY    . 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 PARTIAL HYSTERECTOMY  2004  . TUBAL LIGATION      Medical History: Past Medical History:  Diagnosis Date  . Abnormal mammogram   . Asthma   . Menorrhagia   . Migraines   . Reflux esophagitis     Family History: Family History  Problem Relation Age of Onset  . COPD Mother   . Asthma Mother   . Atrial fibrillation Mother   . Heart disease Father   . Alzheimer's disease Maternal Grandmother   . Alzheimer's disease Maternal Aunt   . Breast cancer Neg Hx     Social History   Socioeconomic History  . Marital status: Married    Spouse name: Not on file  . Number of children: Not on file  . Years of education: Not on file  . Highest education level: Not on file  Occupational History  . Not on file  Social Needs  . Financial resource strain: Not on file  . Food insecurity:    Worry: Not on file    Inability: Not on file  . Transportation needs:    Medical: Not on file    Non-medical: Not on file  Tobacco Use  . Smoking status: Never  Smoker  . Smokeless tobacco: Never Used  Substance and Sexual Activity  . Alcohol use: No    Frequency: Never  . Drug use: No  . Sexual activity: Yes  Lifestyle  . Physical activity:    Days per week: Not on file    Minutes per session: Not on file  . Stress: Not on file  Relationships  . Social connections:    Talks on phone: Not on file    Gets together: Not on file    Attends religious service: Not on file    Active member of club or organization: Not on file    Attends meetings of clubs or organizations: Not on file    Relationship status: Not on file  . Intimate partner violence:    Fear of current or ex partner: Not on file    Emotionally abused: Not on file     Physically abused: Not on file    Forced sexual activity: Not on file  Other Topics Concern  . Not on file  Social History Narrative  . Not on file      Review of Systems  Constitutional: Positive for fatigue. Negative for fever.       Weight gain of 7 pounds since most recent visit.   HENT: Positive for sore throat. Negative for congestion, postnasal drip, rhinorrhea and voice change.   Eyes: Negative.   Respiratory: Negative for cough.   Cardiovascular: Negative for chest pain, palpitations and leg swelling.  Gastrointestinal: Negative for constipation, diarrhea, nausea and vomiting.  Endocrine:       Increased dose of vivelle dot patch. Doing very well with this dosing.   Musculoskeletal: Positive for arthralgias.       Right shoulder pain, taking ibuprofen and aleve has not helped.   Allergic/Immunologic: Positive for environmental allergies.  Neurological: Positive for headaches.  Hematological: Negative for adenopathy. Does not bruise/bleed easily.    Today's Vitals   04/30/18 1541  BP: (!) 147/89  Pulse: 67  Resp: 16  SpO2: 98%  Weight: 227 lb 3.2 oz (103.1 kg)  Height: 5\' 9"  (1.753 m)    Physical Exam  Constitutional: She is oriented to person, place, and time. She appears well-developed and well-nourished. No distress.  HENT:  Head: Normocephalic and atraumatic.  Right Ear: Tympanic membrane, external ear and ear canal normal. Tympanic membrane is not erythematous and not bulging.  Left Ear: Tympanic membrane, external ear and ear canal normal.  Nose: No rhinorrhea. Right sinus exhibits no frontal sinus tenderness. Left sinus exhibits no frontal sinus tenderness.  Mouth/Throat: No oropharyngeal exudate or posterior oropharyngeal edema.  Eyes: Pupils are equal, round, and reactive to light. Conjunctivae and EOM are normal.  Neck: Normal range of motion. Neck supple. No JVD present. No tracheal deviation present. No thyromegaly present.  Cardiovascular: Normal  rate, regular rhythm and normal heart sounds. Exam reveals no gallop and no friction rub.  No murmur heard. Pulmonary/Chest: Effort normal and breath sounds normal. No respiratory distress. She has no wheezes. She has no rales. She exhibits no tenderness.  Abdominal: Soft. Bowel sounds are normal. There is no tenderness.  Musculoskeletal:       Arms: Lymphadenopathy:    She has no cervical adenopathy.  Neurological: She is alert and oriented to person, place, and time. No cranial nerve deficit.  Skin: Skin is warm and dry. She is not diaphoretic.  Psychiatric: She has a normal mood and affect. Her behavior is normal. Judgment and thought content  normal.  Nursing note and vitals reviewed.  Assessment/Plan: 1. Goiter diffuse, nontoxic Thyroid nodule stable. Will check thyroid panel and send results to endocrinology. - T4, free - TSH  2. Moderate obesity Restart phentermine daily. Advised she limit calorie intake to 1200-1500 calories per day. Increase routine physical activity.  - Lipid panel - phentermine (ADIPEX-P) 37.5 MG tablet; Take 1 tablet (37.5 mg total) by mouth daily before breakfast.  Dispense: 30 tablet; Refill: 1  3. Menopausal and perimenopausal disorder Continue vivelle dot as prescribed   4. Vitamin D deficiency - Vitamin D 1,25 dihydroxy  5. Primary osteoarthritis of right shoulder Use OTC medication as needed and as indicated to reduce pain. Will prescribe prednisone taper as needed for acute Pain.     General Counseling: Colleen Richard understanding of the findings of todays visit and agrees with plan of treatment. I have discussed any further diagnostic evaluation that may be needed or ordered today. We also reviewed her medications today. she has been encouraged to call the office with any questions or concerns that should arise related to todays visit.   There is a liability release in patients' chart. There has been a 10 minute discussion about the side  effects including but not limited to elevated blood pressure, anxiety, lack of sleep and dry mouth. Pt understands and will like to start/continue on appetite suppressant at this time. There will be one month RX given at the time of visit with proper follow up. Nova diet plan with restricted calories is given to the pt. Pt understands and agrees with  plan of treatment  This patient was seen by Leretha Pol FNP Collaboration with Dr Lavera Guise as a part of collaborative care agreement  Orders Placed This Encounter  Procedures  . CBC with Differential/Platelet  . Comprehensive metabolic panel  . T4, free  . TSH  . Lipid panel  . Vitamin D 1,25 dihydroxy    Meds ordered this encounter  Medications  . phentermine (ADIPEX-P) 37.5 MG tablet    Sig: Take 1 tablet (37.5 mg total) by mouth daily before breakfast.    Dispense:  30 tablet    Refill:  1    Order Specific Question:   Supervising Provider    Answer:   Lavera Guise [[id number]]    Time spent: 99 Minutes    Dr Lavera Guise Internal medicine

## 2018-05-07 LAB — CBC WITH DIFFERENTIAL/PLATELET
BASOS ABS: 0 10*3/uL (ref 0.0–0.2)
Basos: 0 %
EOS (ABSOLUTE): 0.1 10*3/uL (ref 0.0–0.4)
Eos: 2 %
Hematocrit: 43.5 % (ref 34.0–46.6)
Hemoglobin: 14.5 g/dL (ref 11.1–15.9)
Immature Grans (Abs): 0 10*3/uL (ref 0.0–0.1)
Immature Granulocytes: 0 %
LYMPHS ABS: 1.4 10*3/uL (ref 0.7–3.1)
Lymphs: 31 %
MCH: 29.6 pg (ref 26.6–33.0)
MCHC: 33.3 g/dL (ref 31.5–35.7)
MCV: 89 fL (ref 79–97)
Monocytes Absolute: 0.4 10*3/uL (ref 0.1–0.9)
Monocytes: 9 %
NEUTROS ABS: 2.7 10*3/uL (ref 1.4–7.0)
Neutrophils: 58 %
PLATELETS: 214 10*3/uL (ref 150–450)
RBC: 4.9 x10E6/uL (ref 3.77–5.28)
RDW: 13.1 % (ref 12.3–15.4)
WBC: 4.6 10*3/uL (ref 3.4–10.8)

## 2018-05-07 LAB — COMPREHENSIVE METABOLIC PANEL
ALK PHOS: 71 IU/L (ref 39–117)
ALT: 12 IU/L (ref 0–32)
AST: 21 IU/L (ref 0–40)
Albumin/Globulin Ratio: 2 (ref 1.2–2.2)
Albumin: 4.5 g/dL (ref 3.5–5.5)
BILIRUBIN TOTAL: 0.6 mg/dL (ref 0.0–1.2)
BUN/Creatinine Ratio: 13 (ref 9–23)
BUN: 14 mg/dL (ref 6–24)
CHLORIDE: 102 mmol/L (ref 96–106)
CO2: 23 mmol/L (ref 20–29)
Calcium: 9.2 mg/dL (ref 8.7–10.2)
Creatinine, Ser: 1.05 mg/dL — ABNORMAL HIGH (ref 0.57–1.00)
GFR calc Af Amer: 71 mL/min/{1.73_m2} (ref >59)
GFR calc non Af Amer: 62 mL/min/{1.73_m2} (ref >59)
GLUCOSE: 89 mg/dL (ref 65–99)
Globulin, Total: 2.2 g/dL (ref 1.5–4.5)
Potassium: 4.3 mmol/L (ref 3.5–5.2)
Sodium: 141 mmol/L (ref 134–144)
Total Protein: 6.7 g/dL (ref 6.0–8.5)

## 2018-05-07 LAB — TSH: TSH: 1.21 u[IU]/mL (ref 0.450–4.500)

## 2018-05-07 LAB — LIPID PANEL
CHOLESTEROL TOTAL: 226 mg/dL — AB (ref 100–199)
Chol/HDL Ratio: 3.7 ratio (ref 0.0–4.4)
HDL: 61 mg/dL (ref >39)
LDL Calculated: 144 mg/dL — ABNORMAL HIGH (ref 0–99)
TRIGLYCERIDES: 104 mg/dL (ref 0–149)
VLDL Cholesterol Cal: 21 mg/dL (ref 5–40)

## 2018-05-07 LAB — VITAMIN D 1,25 DIHYDROXY
VITAMIN D 1, 25 (OH) TOTAL: 40 pg/mL
Vitamin D2 1, 25 (OH)2: <10 pg/mL
Vitamin D3 1, 25 (OH)2: 40 pg/mL

## 2018-05-07 LAB — T4, FREE: FREE T4: 1.11 ng/dL (ref 0.82–1.77)

## 2018-05-13 DIAGNOSIS — N959 Unspecified menopausal and perimenopausal disorder: Secondary | ICD-10-CM | POA: Insufficient documentation

## 2018-05-13 DIAGNOSIS — E040 Nontoxic diffuse goiter: Secondary | ICD-10-CM | POA: Insufficient documentation

## 2018-05-13 DIAGNOSIS — M19011 Primary osteoarthritis, right shoulder: Secondary | ICD-10-CM | POA: Insufficient documentation

## 2018-05-13 DIAGNOSIS — E559 Vitamin D deficiency, unspecified: Secondary | ICD-10-CM | POA: Insufficient documentation

## 2018-05-22 ENCOUNTER — Ambulatory Visit: Payer: BLUE CROSS/BLUE SHIELD | Admitting: Gastroenterology

## 2018-05-30 ENCOUNTER — Ambulatory Visit: Payer: BLUE CROSS/BLUE SHIELD | Admitting: Gastroenterology

## 2018-06-14 ENCOUNTER — Ambulatory Visit: Payer: Self-pay | Admitting: Nurse Practitioner

## 2018-06-18 ENCOUNTER — Encounter: Payer: Self-pay | Admitting: Nurse Practitioner

## 2018-06-18 ENCOUNTER — Ambulatory Visit: Payer: Managed Care, Other (non HMO) | Admitting: Nurse Practitioner

## 2018-06-18 VITALS — BP 123/92 | HR 90 | Resp 16 | Ht 69.0 in | Wt 217.4 lb

## 2018-06-18 DIAGNOSIS — E668 Other obesity: Secondary | ICD-10-CM

## 2018-06-18 DIAGNOSIS — N959 Unspecified menopausal and perimenopausal disorder: Secondary | ICD-10-CM | POA: Diagnosis not present

## 2018-06-18 DIAGNOSIS — Z23 Encounter for immunization: Secondary | ICD-10-CM

## 2018-06-18 DIAGNOSIS — F5101 Primary insomnia: Secondary | ICD-10-CM

## 2018-06-18 MED ORDER — PHENTERMINE HCL 37.5 MG PO TABS: 37.5000 mg | ORAL_TABLET | Freq: Every day | ORAL | 1 refills | Status: DC

## 2018-06-18 MED ORDER — ZOLPIDEM TARTRATE 10 MG PO TABS: 10.0000 mg | ORAL_TABLET | Freq: Every evening | ORAL | 3 refills | Status: DC | PRN

## 2018-06-18 NOTE — Progress Notes (Signed)
[facility] [place], [place] [address]  Internal MEDICINE  Office Visit Note  Patient Name: Colleen Richard  [id number]  [id number]  Date of Service: 06/18/2018  Chief Complaint  Patient presents with  . Asthma  . Medical Management of Chronic Issues    weight management     Restarted the patient on phentermine at her last visit. She is taking this most days. She has had a 10 pound weight loss since her last visit. She is reporting no negative side effects from taking this medication. Blood pressure has improved.  She does take ambien 10 mg some nights to help with insomnia. She would like to have a refill for this today.  She needs to have TDAP 09/2018.      Current Medication: Outpatient Encounter Medications as of 06/18/2018  Medication Sig  . albuterol (PROVENTIL HFA;VENTOLIN HFA) 108 (90 Base) MCG/ACT inhaler Inhale 2 puffs into the lungs every 6 (six) hours as needed for wheezing or shortness of breath.  Marland Kitchen aspirin-acetaminophen-caffeine (EXCEDRIN MIGRAINE) 250-250-65 MG tablet Take 2 tablets by mouth every 6 (six) hours as needed for headache.  . estradiol (VIVELLE-DOT) 0.05 MG/24HR patch Place 1 patch (0.05 mg total) onto the skin 2 (two) times a week.  . phentermine (ADIPEX-P) 37.5 MG tablet Take 1 tablet (37.5 mg total) by mouth daily before breakfast.  . zolpidem (AMBIEN) 10 MG tablet Take 1 tablet (10 mg total) by mouth at bedtime as needed for sleep.  . [DISCONTINUED] phentermine (ADIPEX-P) 37.5 MG tablet Take 1 tablet (37.5 mg total) by mouth daily before breakfast.  . [DISCONTINUED] zolpidem (AMBIEN) 10 MG tablet Take 10 mg by mouth at bedtime as needed for sleep.  . [DISCONTINUED] predniSONE (STERAPRED UNI-PAK 21 TAB) 10 MG (21) TBPK tablet 6 day taper - take by mouth as directed for 6 days (Patient not taking: Reported on 04/30/2018)   No facility-administered encounter medications on file as of 06/18/2018.     Surgical History: Past  Surgical History:  Procedure Laterality Date  . BREAST BIOPSY Bilateral 2010   excisional - neg  . BREAST BIOPSY Right 08/26/2015   stereo  . CATARACT EXTRACTION, BILATERAL  01/2013, 2015  . COLON SURGERY    . COLONOSCOPY WITH PROPOFOL N/A 11/03/2017   Procedure: COLONOSCOPY WITH PROPOFOL;  Surgeon: Virgel Manifold, MD;  Location: [facility] ENDOSCOPY;  Service: Endoscopy;  Laterality: N/A;  . EYE SURGERY    . 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 PARTIAL HYSTERECTOMY  2004  . TUBAL LIGATION      Medical History: Past Medical History:  Diagnosis Date  . Abnormal mammogram   . Asthma   . Menorrhagia   . Migraines   . Reflux esophagitis     Family History: Family History  Problem Relation Age of Onset  . COPD Mother   . Asthma Mother   . Atrial fibrillation Mother   . Heart disease Father   . Alzheimer's disease Maternal Grandmother   . Alzheimer's disease Maternal Aunt   . Breast cancer Neg Hx     Social History   Socioeconomic History  . Marital status: Married    Spouse name: Not on file  . Number of children: Not on file  . Years of education: Not on file  . Highest education level: Not on file  Occupational History  . Not on file  Social Needs  . Financial resource strain: Not on file  . Food  insecurity:    Worry: Not on file    Inability: Not on file  . Transportation needs:    Medical: Not on file    Non-medical: Not on file  Tobacco Use  . Smoking status: Never Smoker  . Smokeless tobacco: Never Used  Substance and Sexual Activity  . Alcohol use: No    Frequency: Never  . Drug use: No  . Sexual activity: Yes  Lifestyle  . Physical activity:    Days per week: Not on file    Minutes per session: Not on file  . Stress: Not on file  Relationships  . Social connections:    Talks on phone: Not on file    Gets together: Not on file    Attends religious service: Not on file    Active member of club or organization: Not on file     Attends meetings of clubs or organizations: Not on file    Relationship status: Not on file  . Intimate partner violence:    Fear of current or ex partner: Not on file    Emotionally abused: Not on file    Physically abused: Not on file    Forced sexual activity: Not on file  Other Topics Concern  . Not on file  Social History Narrative  . Not on file      Review of Systems  Constitutional: Positive for fatigue. Negative for fever and unexpected weight change.       Weight loss 10 pounds since her last visit.   HENT: Negative for congestion, postnasal drip, rhinorrhea, sore throat and voice change.   Eyes: Negative.   Respiratory: Negative for cough and wheezing.   Cardiovascular: Negative for chest pain, palpitations and leg swelling.  Gastrointestinal: Negative for constipation, diarrhea, nausea and vomiting.  Endocrine: Negative for cold intolerance, heat intolerance, polydipsia, polyphagia and polyuria.       Increased dose of vivelle dot patch. Doing very well with this dosing.   Genitourinary: Negative.   Musculoskeletal: Positive for arthralgias.       Right shoulder pain, taking ibuprofen and aleve has not helped.   Skin: Negative for rash.  Allergic/Immunologic: Positive for environmental allergies.  Neurological: Positive for headaches.  Hematological: Negative for adenopathy. Does not bruise/bleed easily.  Psychiatric/Behavioral: Positive for sleep disturbance.    Today's Vitals   06/18/18 0849  BP: (!) 123/92  Pulse: 90  Resp: 16  SpO2: 98%  Weight: 217 lb 6.4 oz (98.6 kg)  Height: 5\' 9"  (1.753 m)    Physical Exam  Constitutional: She is oriented to person, place, and time. She appears well-developed and well-nourished. No distress.  HENT:  Head: Normocephalic and atraumatic.  Nose: Nose normal.  Mouth/Throat: Oropharynx is clear and moist. No oropharyngeal exudate.  Eyes: Pupils are equal, round, and reactive to light. Conjunctivae and EOM are  normal.  Neck: Normal range of motion. Neck supple. No JVD present. No tracheal deviation present. No thyromegaly present.  Cardiovascular: Normal rate, regular rhythm and normal heart sounds. Exam reveals no gallop and no friction rub.  No murmur heard. Pulmonary/Chest: Effort normal and breath sounds normal. No respiratory distress. She has no wheezes. She has no rales. She exhibits no tenderness.  Abdominal: Soft. Bowel sounds are normal. There is no tenderness.  Musculoskeletal: Normal range of motion.  Lymphadenopathy:    She has no cervical adenopathy.  Neurological: She is alert and oriented to person, place, and time. No cranial nerve deficit.  Skin: Skin is  warm and dry. Capillary refill takes less than 2 seconds. She is not diaphoretic.  Psychiatric: She has a normal mood and affect. Her behavior is normal. Judgment and thought content normal.  Nursing note and vitals reviewed.  Assessment/Plan:  1. Menopausal and perimenopausal disorder Well controlled with current medications.   2. Moderate obesity Doing well with 10 pound weight loss since last visit. May continue phentermine daily. Recommended she participate in routine exercise program to help with weight loss.  - phentermine (ADIPEX-P) 37.5 MG tablet; Take 1 tablet (37.5 mg total) by mouth daily before breakfast.  Dispense: 30 tablet; Refill: 1  3. Primary insomnia May continue ambien 10mg  when needed. New rx sent to her pharmacy.  - zolpidem (AMBIEN) 10 MG tablet; Take 1 tablet (10 mg total) by mouth at bedtime as needed for sleep.  Dispense: 30 tablet; Refill: 3  4. Need for prophylactic vaccination with combined diphtheria-tetanus-pertussis (DTaP) vaccine - Tdap vaccine greater than or equal to 7yo IM  General Counseling: Valentina Gu understanding of the findings of todays visit and agrees with plan of treatment. I have discussed any further diagnostic evaluation that may be needed or ordered today. We also  reviewed her medications today. she has been encouraged to call the office with any questions or concerns that should arise related to todays visit.    There is a liability release in patients' chart. There has been a 10 minute discussion about the side effects including but not limited to elevated blood pressure, anxiety, lack of sleep and dry mouth. Pt understands and will like to start/continue on appetite suppressant at this time. There will be one month RX given at the time of visit with proper follow up. Nova diet plan with restricted calories is given to the pt. Pt understands and agrees with  plan of treatment  This patient was seen by Leretha Pol FNP Collaboration with Dr Lavera Guise as a part of collaborative care agreement  Orders Placed This Encounter  Procedures  . Tdap vaccine greater than or equal to 7yo IM    Meds ordered this encounter  Medications  . phentermine (ADIPEX-P) 37.5 MG tablet    Sig: Take 1 tablet (37.5 mg total) by mouth daily before breakfast.    Dispense:  30 tablet    Refill:  1    Order Specific Question:   Supervising Provider    Answer:   Lavera Guise [place]  . zolpidem (AMBIEN) 10 MG tablet    Sig: Take 1 tablet (10 mg total) by mouth at bedtime as needed for sleep.    Dispense:  30 tablet    Refill:  3    Order Specific Question:   Supervising Provider    Answer:   Lavera Guise [[id number]]    Time spent: 22 Minutes      Dr Lavera Guise Internal medicine

## 2018-06-26 ENCOUNTER — Telehealth: Payer: Self-pay | Admitting: Nurse Practitioner

## 2018-06-26 ENCOUNTER — Encounter: Payer: Self-pay | Admitting: Nurse Practitioner

## 2018-06-26 NOTE — Telephone Encounter (Signed)
Spoke with patient and she has been advised we will discuss ordering a thyroid ultrasound with her provider, she had one January and may be due for repeat or will need referral out to endocrinology/.Beth

## 2018-07-19 ENCOUNTER — Other Ambulatory Visit: Payer: Self-pay | Admitting: Nurse Practitioner

## 2018-07-19 DIAGNOSIS — E040 Nontoxic diffuse goiter: Secondary | ICD-10-CM

## 2018-07-27 ENCOUNTER — Ambulatory Visit (INDEPENDENT_AMBULATORY_CARE_PROVIDER_SITE_OTHER): Payer: Managed Care, Other (non HMO)

## 2018-07-27 DIAGNOSIS — E040 Nontoxic diffuse goiter: Secondary | ICD-10-CM

## 2018-08-06 ENCOUNTER — Ambulatory Visit: Payer: Self-pay | Admitting: Nurse Practitioner

## 2018-08-08 ENCOUNTER — Ambulatory Visit: Payer: Managed Care, Other (non HMO) | Admitting: Nurse Practitioner

## 2018-08-08 ENCOUNTER — Encounter: Payer: Self-pay | Admitting: Nurse Practitioner

## 2018-08-08 VITALS — BP 127/81 | HR 92 | Resp 16 | Ht 69.0 in | Wt 214.0 lb

## 2018-08-08 DIAGNOSIS — E040 Nontoxic diffuse goiter: Secondary | ICD-10-CM | POA: Diagnosis not present

## 2018-08-08 DIAGNOSIS — E668 Other obesity: Secondary | ICD-10-CM | POA: Diagnosis not present

## 2018-08-08 DIAGNOSIS — J0140 Acute pansinusitis, unspecified: Secondary | ICD-10-CM

## 2018-08-08 MED ORDER — PREDNISONE 10 MG (21) PO TBPK: ORAL_TABLET | ORAL | 0 refills | Status: DC

## 2018-08-08 MED ORDER — AZITHROMYCIN 250 MG PO TABS: ORAL_TABLET | ORAL | 0 refills | Status: DC

## 2018-08-08 NOTE — Progress Notes (Signed)
[facility] [place], [place] [address]  Internal MEDICINE  Office Visit Note  Patient Name: Colleen Richard  [id number]  [id number]  Date of Service: 08/08/2018  Chief Complaint  Patient presents with  . Asthma  . Medical Management of Chronic Issues    Weight management    The patient is here for management of weight loss. She is currently taking phentermine. Takes this for a week, then breaks for a week. Exercises regularly and consumes a healthy, low-calorie diet. She has lost 3 pounds since her last visit. Blood pressure is well controlled.  She has had ultrasound of her thyroid since her last visit. Right thyroid nodule is stable. She has small, sub centimeter nodule on left thryoid which is also stable.  Has started to develop nasal congestion, sore throat, headache, and cough. She states this will generally develop into severe sinus infection and cause a flare of her asthma. She tends to need antibiotics and prednisone taper to alleviate the symptoms.       Current Medication: Outpatient Encounter Medications as of 08/08/2018  Medication Sig  . albuterol (PROVENTIL HFA;VENTOLIN HFA) 108 (90 Base) MCG/ACT inhaler Inhale 2 puffs into the lungs every 6 (six) hours as needed for wheezing or shortness of breath.  Marland Kitchen aspirin-acetaminophen-caffeine (EXCEDRIN MIGRAINE) 250-250-65 MG tablet Take 2 tablets by mouth every 6 (six) hours as needed for headache.  . estradiol (VIVELLE-DOT) 0.05 MG/24HR patch Place 1 patch (0.05 mg total) onto the skin 2 (two) times a week.  . phentermine (ADIPEX-P) 37.5 MG tablet Take 1 tablet (37.5 mg total) by mouth daily before breakfast.  . zolpidem (AMBIEN) 10 MG tablet Take 1 tablet (10 mg total) by mouth at bedtime as needed for sleep.  Marland Kitchen azithromycin (ZITHROMAX) 250 MG tablet z-pack - take as directed for 5 days  . predniSONE (STERAPRED UNI-PAK 21 TAB) 10 MG (21) TBPK tablet 6 day taper - take by mouth as directed for 6 days    No facility-administered encounter medications on file as of 08/08/2018.     Surgical History: Past Surgical History:  Procedure Laterality Date  . BREAST BIOPSY Bilateral 2010   excisional - neg  . BREAST BIOPSY Right 08/26/2015   stereo  . CATARACT EXTRACTION, BILATERAL  01/2013, 2015  . COLON SURGERY    . COLONOSCOPY WITH PROPOFOL N/A 11/03/2017   Procedure: COLONOSCOPY WITH PROPOFOL;  Surgeon: Virgel Manifold, MD;  Location: [facility] ENDOSCOPY;  Service: Endoscopy;  Laterality: N/A;  . EYE SURGERY    . 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 PARTIAL HYSTERECTOMY  2004  . TUBAL LIGATION      Medical History: Past Medical History:  Diagnosis Date  . Abnormal mammogram   . Asthma   . Menorrhagia   . Migraines   . Reflux esophagitis     Family History: Family History  Problem Relation Age of Onset  . COPD Mother   . Asthma Mother   . Atrial fibrillation Mother   . Heart disease Father   . Alzheimer's disease Maternal Grandmother   . Alzheimer's disease Maternal Aunt   . Breast cancer Neg Hx     Social History   Socioeconomic History  . Marital status: Married    Spouse name: Not on file  . Number of children: Not on file  . Years of education: Not on file  . Highest education level: Not on file  Occupational History  . Not on  file  Social Needs  . Financial resource strain: Not on file  . Food insecurity:    Worry: Not on file    Inability: Not on file  . Transportation needs:    Medical: Not on file    Non-medical: Not on file  Tobacco Use  . Smoking status: Never Smoker  . Smokeless tobacco: Never Used  Substance and Sexual Activity  . Alcohol use: No    Frequency: Never  . Drug use: No  . Sexual activity: Yes  Lifestyle  . Physical activity:    Days per week: Not on file    Minutes per session: Not on file  . Stress: Not on file  Relationships  . Social connections:    Talks on phone: Not on file    Gets together:  Not on file    Attends religious service: Not on file    Active member of club or organization: Not on file    Attends meetings of clubs or organizations: Not on file    Relationship status: Not on file  . Intimate partner violence:    Fear of current or ex partner: Not on file    Emotionally abused: Not on file    Physically abused: Not on file    Forced sexual activity: Not on file  Other Topics Concern  . Not on file  Social History Narrative  . Not on file      Review of Systems  Constitutional: Negative for chills and fatigue.       Three pound weight loss since her last visit .  HENT: Positive for congestion, postnasal drip, rhinorrhea and sore throat. Negative for ear pain, sinus pain and voice change.   Respiratory: Positive for cough. Negative for shortness of breath and wheezing.   Cardiovascular: Negative for chest pain and palpitations.  Gastrointestinal: Negative for nausea and vomiting.  Endocrine: Positive for heat intolerance. Negative for cold intolerance, polydipsia, polyphagia and polyuria.       Recent thyroid ultrasound indicating stable thyroid nodules.   Musculoskeletal: Negative.   Skin: Negative for rash.  Allergic/Immunologic: Positive for environmental allergies.  Neurological: Positive for headaches.  Hematological: Negative for adenopathy.  Psychiatric/Behavioral: Negative.     Today's Vitals   08/08/18 1132  BP: 127/81  Pulse: 92  Resp: 16  SpO2: 97%  Weight: 214 lb (97.1 kg)  Height: 5\' 9"  (1.753 m)    Physical Exam  Constitutional: She is oriented to person, place, and time. She appears well-developed and well-nourished. No distress.  HENT:  Head: Normocephalic and atraumatic.  Right Ear: External ear normal.  Left Ear: External ear normal.  Nose: Rhinorrhea present. Right sinus exhibits no maxillary sinus tenderness and no frontal sinus tenderness. Left sinus exhibits no maxillary sinus tenderness and no frontal sinus tenderness.   Mouth/Throat: Oropharynx is clear and moist. No oropharyngeal exudate.  Moderate post nasal drip is evident.   Eyes: Pupils are equal, round, and reactive to light. EOM are normal.  Neck: Normal range of motion. Neck supple. No JVD present. No tracheal deviation present. No thyromegaly present.  Cardiovascular: Normal rate, regular rhythm and normal heart sounds. Exam reveals no gallop and no friction rub.  No murmur heard. Pulmonary/Chest: Effort normal. No respiratory distress. She has no wheezes. She has no rales. She exhibits no tenderness.  Abdominal: Soft. Bowel sounds are normal.  Musculoskeletal: Normal range of motion.  Lymphadenopathy:    She has no cervical adenopathy.  Neurological: She is alert and  oriented to person, place, and time. No cranial nerve deficit.  Skin: Skin is warm and dry. She is not diaphoretic.  Psychiatric: She has a normal mood and affect. Her behavior is normal. Judgment and thought content normal.  Nursing note and vitals reviewed.  Assessment/Plan: 1. Acute non-recurrent pansinusitis Start z-pack. Take as directed for 5 days. Prednisone taper also started. Use as directed for 6 days. Take OTC medications as needed and as directed to alleviate symptoms.  - azithromycin (ZITHROMAX) 250 MG tablet; z-pack - take as directed for 5 days  Dispense: 6 tablet; Refill: 0 - predniSONE (STERAPRED UNI-PAK 21 TAB) 10 MG (21) TBPK tablet; 6 day taper - take by mouth as directed for 6 days  Dispense: 21 tablet; Refill: 0  2. Goiter diffuse, nontoxic Reviewed thyroid ultrasound. Large, left thyroid nodule is stable. Smaller nodule also unchanged. Will repeat in 6 months for further evaluation.   3. Moderate obesity Improving. Continue phentermine 37.5mg  tablets as before. Maintain 1500 calorie diet and incorporate exercise into daily routine.   General Counseling: [email or web address] [email or web address] understanding of the findings of todays visit and agrees with plan of treatment. I have  discussed any further diagnostic evaluation that may be needed or ordered today. We also reviewed her medications today. she has been encouraged to call the office with any questions or concerns that should arise related to todays visit.   There is a liability release in patients' chart. There has been a 10 minute discussion about the side effects including but not limited to elevated blood pressure, anxiety, lack of sleep and dry mouth. Pt understands and will like to start/continue on appetite suppressant at this time. There will be one month RX given at the time of visit with proper follow up. Nova diet plan with restricted calories is given to the pt. Pt understands and agrees with  plan of treatment  This patient was seen by Leretha Pol FNP Collaboration with Dr Lavera Guise as a part of collaborative care agreement  Meds ordered this encounter  Medications  . azithromycin (ZITHROMAX) 250 MG tablet    Sig: z-pack - take as directed for 5 days    Dispense:  6 tablet    Refill:  0    Order Specific Question:   Supervising Provider    Answer:   Lavera Guise [place]  . predniSONE (STERAPRED UNI-PAK 21 TAB) 10 MG (21) TBPK tablet    Sig: 6 day taper - take by mouth as directed for 6 days    Dispense:  21 tablet    Refill:  0    Order Specific Question:   Supervising Provider    Answer:   Lavera Guise [[id number]]    Time spent: 36 Minutes      Dr Lavera Guise Internal medicine

## 2018-10-09 ENCOUNTER — Ambulatory Visit: Payer: Managed Care, Other (non HMO) | Admitting: Nurse Practitioner

## 2018-10-09 ENCOUNTER — Encounter: Payer: Self-pay | Admitting: Nurse Practitioner

## 2018-10-09 VITALS — BP 148/92 | HR 68 | Resp 16 | Ht 69.0 in | Wt 210.2 lb

## 2018-10-09 DIAGNOSIS — Z6831 Body mass index (BMI) 31.0-31.9, adult: Secondary | ICD-10-CM | POA: Diagnosis not present

## 2018-10-09 DIAGNOSIS — Z1239 Encounter for other screening for malignant neoplasm of breast: Secondary | ICD-10-CM

## 2018-10-09 DIAGNOSIS — F5101 Primary insomnia: Secondary | ICD-10-CM | POA: Diagnosis not present

## 2018-10-09 DIAGNOSIS — G43809 Other migraine, not intractable, without status migrainosus: Secondary | ICD-10-CM

## 2018-10-09 DIAGNOSIS — G43909 Migraine, unspecified, not intractable, without status migrainosus: Secondary | ICD-10-CM | POA: Insufficient documentation

## 2018-10-09 NOTE — Progress Notes (Signed)
[facility] [place], [place] [address]  Internal MEDICINE  Office Visit Note  Patient Name: Colleen Richard  [id number]  [id number]  Date of Service: 10/09/2018  Chief Complaint  Patient presents with  . Medical Management of Chronic Issues    2 month follow up weight management    The patient is here for management of weight loss. She is currently taking phentermine. Takes this for a week, then breaks for a week. Exercises regularly and consumes a healthy, low-calorie diet. She has lost 4 pounds since her last visit. Blood pressure is generally well controlled. Blood pressure is slightly elevated today. She woke up with headache this morning. Often this will elevate her blood pressure some. Phentermine not contributing to this elevation as she has not been taking it this past week or so.  She is due to have a mammogram after 10/27/2018. She gets these done through [facility] and needs order to get this done.       Current Medication: Outpatient Encounter Medications as of 10/09/2018  Medication Sig  . albuterol (PROVENTIL HFA;VENTOLIN HFA) 108 (90 Base) MCG/ACT inhaler Inhale 2 puffs into the lungs every 6 (six) hours as needed for wheezing or shortness of breath.  Marland Kitchen aspirin-acetaminophen-caffeine (EXCEDRIN MIGRAINE) 250-250-65 MG tablet Take 2 tablets by mouth every 6 (six) hours as needed for headache.  . estradiol (VIVELLE-DOT) 0.05 MG/24HR patch Place 1 patch (0.05 mg total) onto the skin 2 (two) times a week.  . phentermine (ADIPEX-P) 37.5 MG tablet Take 1 tablet (37.5 mg total) by mouth daily before breakfast.  . zolpidem (AMBIEN) 10 MG tablet Take 1 tablet (10 mg total) by mouth at bedtime as needed for sleep.  Marland Kitchen azithromycin (ZITHROMAX) 250 MG tablet z-pack - take as directed for 5 days (Patient not taking: Reported on 10/09/2018)  . predniSONE (STERAPRED UNI-PAK 21 TAB) 10 MG (21) TBPK tablet 6 day taper - take by mouth as directed for 6 days (Patient  not taking: Reported on 10/09/2018)   No facility-administered encounter medications on file as of 10/09/2018.     Surgical History: Past Surgical History:  Procedure Laterality Date  . BREAST BIOPSY Bilateral 2010   excisional - neg  . BREAST BIOPSY Right 08/26/2015   stereo  . CATARACT EXTRACTION, BILATERAL  01/2013, 2015  . COLON SURGERY    . COLONOSCOPY WITH PROPOFOL N/A 11/03/2017   Procedure: COLONOSCOPY WITH PROPOFOL;  Surgeon: Virgel Manifold, MD;  Location: [facility] ENDOSCOPY;  Service: Endoscopy;  Laterality: N/A;  . EYE SURGERY    . 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 PARTIAL HYSTERECTOMY  2004  . TUBAL LIGATION      Medical History: Past Medical History:  Diagnosis Date  . Abnormal mammogram   . Asthma   . Menorrhagia   . Migraines   . Reflux esophagitis     Family History: Family History  Problem Relation Age of Onset  . COPD Mother   . Asthma Mother   . Atrial fibrillation Mother   . Heart disease Father   . Alzheimer's disease Maternal Grandmother   . Alzheimer's disease Maternal Aunt   . Breast cancer Neg Hx     Social History   Socioeconomic History  . Marital status: Married    Spouse name: Not on file  . Number of children: Not on file  . Years of education: Not on file  . Highest education level: Not on file  Occupational History  . Not on file  Social Needs  . Financial resource strain: Not on file  . Food insecurity:    Worry: Not on file    Inability: Not on file  . Transportation needs:    Medical: Not on file    Non-medical: Not on file  Tobacco Use  . Smoking status: Never Smoker  . Smokeless tobacco: Never Used  Substance and Sexual Activity  . Alcohol use: No    Frequency: Never  . Drug use: No  . Sexual activity: Yes  Lifestyle  . Physical activity:    Days per week: Not on file    Minutes per session: Not on file  . Stress: Not on file  Relationships  . Social connections:    Talks on  phone: Not on file    Gets together: Not on file    Attends religious service: Not on file    Active member of club or organization: Not on file    Attends meetings of clubs or organizations: Not on file    Relationship status: Not on file  . Intimate partner violence:    Fear of current or ex partner: Not on file    Emotionally abused: Not on file    Physically abused: Not on file    Forced sexual activity: Not on file  Other Topics Concern  . Not on file  Social History Narrative  . Not on file      Review of Systems  Constitutional: Negative for chills and fatigue.       Four pound weight loss since her last visit .  HENT: Negative for congestion, ear pain, postnasal drip, rhinorrhea, sinus pain, sore throat and voice change.   Respiratory: Negative for cough, shortness of breath and wheezing.   Cardiovascular: Negative for chest pain and palpitations.       Mildly elevated blood pressure today.  Gastrointestinal: Negative for nausea and vomiting.  Endocrine: Positive for heat intolerance. Negative for cold intolerance, polydipsia, polyphagia and polyuria.       Stable thyroid nodules.   Skin: Negative for rash.  Allergic/Immunologic: Positive for environmental allergies.  Neurological: Positive for headaches.  Hematological: Negative for adenopathy.  Psychiatric/Behavioral: Positive for sleep disturbance.    Today's Vitals   10/09/18 1055  BP: (!) 148/92  Pulse: 68  Resp: 16  SpO2: 99%  Weight: 210 lb 3.2 oz (95.3 kg)  Height: 5\' 9"  (1.753 m)  Body mass index is 31.04 kg/m.  Physical Exam Vitals signs and nursing note reviewed.  Constitutional:      General: She is not in acute distress.    Appearance: Normal appearance. She is well-developed. She is not diaphoretic.  HENT:     Head: Normocephalic and atraumatic.     Right Ear: External ear normal.     Left Ear: External ear normal.     Nose: No rhinorrhea.     Right Sinus: No maxillary sinus tenderness or  frontal sinus tenderness.     Left Sinus: No maxillary sinus tenderness or frontal sinus tenderness.     Mouth/Throat:     Pharynx: No oropharyngeal exudate.  Eyes:     Pupils: Pupils are equal, round, and reactive to light.  Neck:     Musculoskeletal: Normal range of motion and neck supple.     Thyroid: No thyromegaly.     Vascular: No JVD.     Trachea: No tracheal deviation.  Cardiovascular:     Rate and  Rhythm: Normal rate and regular rhythm.     Heart sounds: Normal heart sounds. No murmur. No friction rub. No gallop.   Pulmonary:     Effort: Pulmonary effort is normal. No respiratory distress.     Breath sounds: Normal breath sounds. No wheezing or rales.  Chest:     Chest wall: No tenderness.  Abdominal:     General: Bowel sounds are normal.     Palpations: Abdomen is soft.  Musculoskeletal: Normal range of motion.  Lymphadenopathy:     Cervical: No cervical adenopathy.  Skin:    General: Skin is warm and dry.  Neurological:     Mental Status: She is alert and oriented to person, place, and time.     Cranial Nerves: No cranial nerve deficit.  Psychiatric:        Behavior: Behavior normal.        Thought Content: Thought content normal.        Judgment: Judgment normal.    Assessment/Plan: 1. Other migraine without status migrainosus, not intractable Continue to use abortive therapy as needed and as prescribed   2. Primary insomnia Use ambien at night as needed for acute anxiety.   3. Body mass index (bmi) 31.0-31.9, adult May continue phentermine 37.5mg  tablets as before. Limit calorie intake to 1500 calories per day and incorporate exercise into daily routine.   4. Screening for breast cancer - MM DIGITAL SCREENING BILATERAL; Future  General Counseling: [email or web address] casa understanding of the findings of todays visit and agrees with plan of treatment. I have discussed any further diagnostic evaluation that may be needed or ordered today. We also reviewed her  medications today. she has been encouraged to call the office with any questions or concerns that should arise related to todays visit.   There is a liability release in patients' chart. There has been a 10 minute discussion about the side effects including but not limited to elevated blood pressure, anxiety, lack of sleep and dry mouth. Pt understands and will like to start/continue on appetite suppressant at this time. There will be one month RX given at the time of visit with proper follow up. Nova diet plan with restricted calories is given to the pt. Pt understands and agrees with  plan of treatment  This patient was seen by Leretha Pol FNP Collaboration with Dr Lavera Guise as a part of collaborative care agreement  Orders Placed This Encounter  Procedures  . MM DIGITAL SCREENING BILATERAL     Time spent: 80 Minutes      Dr Lavera Guise Internal medicine

## 2018-10-18 ENCOUNTER — Encounter: Payer: Self-pay | Admitting: Nurse Practitioner

## 2018-10-19 ENCOUNTER — Other Ambulatory Visit: Payer: Self-pay | Admitting: Nurse Practitioner

## 2018-10-19 DIAGNOSIS — M19011 Primary osteoarthritis, right shoulder: Secondary | ICD-10-CM

## 2018-10-19 MED ORDER — PREDNISONE 10 MG PO TABS: ORAL_TABLET | ORAL | 1 refills | Status: DC

## 2018-10-19 NOTE — Progress Notes (Signed)
Sent prednisone 10mg  taper for 6 days due to flare of right shoulder busrsitis.

## 2018-10-22 ENCOUNTER — Encounter: Payer: Self-pay | Admitting: Nurse Practitioner

## 2018-11-01 ENCOUNTER — Ambulatory Visit
Admission: RE | Admit: 2018-11-01 | Discharge: 2018-11-01 | Disposition: A | Discharge status: Home / Self Care | Payer: Managed Care, Other (non HMO) | Source: Ambulatory Visit | Attending: Nurse Practitioner | Admitting: Nurse Practitioner

## 2018-11-01 DIAGNOSIS — Z1239 Encounter for other screening for malignant neoplasm of breast: Secondary | ICD-10-CM | POA: Diagnosis present

## 2018-11-01 DIAGNOSIS — Z1231 Encounter for screening mammogram for malignant neoplasm of breast: Secondary | ICD-10-CM | POA: Insufficient documentation

## 2018-12-11 ENCOUNTER — Ambulatory Visit: Payer: Self-pay | Admitting: Nurse Practitioner

## 2019-01-06 ENCOUNTER — Encounter: Payer: Self-pay | Admitting: Nurse Practitioner

## 2019-01-10 ENCOUNTER — Telehealth: Payer: Self-pay | Admitting: Nurse Practitioner

## 2019-01-10 MED ORDER — TRETINOIN 0.05 % EX CREA: TOPICAL_CREAM | Freq: Every day | CUTANEOUS | 0 refills | Status: DC

## 2019-01-10 NOTE — Telephone Encounter (Signed)
Prior authorization for medication Tretinoin cream  Approvedtoday CaseId:[id number];Status:Approved;Review Type:Prior Auth;Coverage Start Date:01/10/2019;Coverage End Date:01/10/2020; Pharmacy contacted

## 2019-09-20 DIAGNOSIS — Z923 Personal history of irradiation: Secondary | ICD-10-CM

## 2019-09-20 HISTORY — DX: Personal history of irradiation: Z92.3

## 2019-10-02 ENCOUNTER — Encounter: Payer: Self-pay | Admitting: Nurse Practitioner

## 2019-10-03 ENCOUNTER — Other Ambulatory Visit: Payer: Self-pay | Admitting: Nurse Practitioner

## 2019-10-03 DIAGNOSIS — E040 Nontoxic diffuse goiter: Secondary | ICD-10-CM

## 2019-10-03 DIAGNOSIS — Z0001 Encounter for general adult medical examination with abnormal findings: Secondary | ICD-10-CM

## 2019-10-03 DIAGNOSIS — E559 Vitamin D deficiency, unspecified: Secondary | ICD-10-CM

## 2019-10-03 NOTE — Progress Notes (Signed)
Orders for routine, fasting labs placed in Epic and released.

## 2019-10-16 LAB — COMPREHENSIVE METABOLIC PANEL
ALT: 10 IU/L (ref 0–32)
AST: 16 IU/L (ref 0–40)
Albumin/Globulin Ratio: 2.1 (ref 1.2–2.2)
Albumin: 4.7 g/dL (ref 3.8–4.9)
Alkaline Phosphatase: 79 IU/L (ref 39–117)
BUN/Creatinine Ratio: 22 (ref 9–23)
BUN: 20 mg/dL (ref 6–24)
Bilirubin Total: 0.3 mg/dL (ref 0.0–1.2)
CO2: 23 mmol/L (ref 20–29)
Calcium: 9.4 mg/dL (ref 8.7–10.2)
Chloride: 106 mmol/L (ref 96–106)
Creatinine, Ser: 0.9 mg/dL (ref 0.57–1.00)
GFR calc Af Amer: 85 mL/min/{1.73_m2} (ref >59)
GFR calc non Af Amer: 74 mL/min/{1.73_m2} (ref >59)
Globulin, Total: 2.2 g/dL (ref 1.5–4.5)
Glucose: 96 mg/dL (ref 65–99)
Potassium: 4.3 mmol/L (ref 3.5–5.2)
Sodium: 142 mmol/L (ref 134–144)
Total Protein: 6.9 g/dL (ref 6.0–8.5)

## 2019-10-16 LAB — LIPID PANEL
Chol/HDL Ratio: 3.4 ratio (ref 0.0–4.4)
Cholesterol, Total: 236 mg/dL — ABNORMAL HIGH (ref 100–199)
HDL: 70 mg/dL (ref >39)
LDL Chol Calc (NIH): 154 mg/dL — ABNORMAL HIGH (ref 0–99)
Triglycerides: 68 mg/dL (ref 0–149)
VLDL Cholesterol Cal: 12 mg/dL (ref 5–40)

## 2019-10-16 LAB — CBC WITH DIFFERENTIAL/PLATELET
Basophils Absolute: 0 10*3/uL (ref 0.0–0.2)
Basos: 1 %
EOS (ABSOLUTE): 0.1 10*3/uL (ref 0.0–0.4)
Eos: 3 %
Hematocrit: 43.9 % (ref 34.0–46.6)
Hemoglobin: 14.5 g/dL (ref 11.1–15.9)
Immature Grans (Abs): 0 10*3/uL (ref 0.0–0.1)
Immature Granulocytes: 0 %
Lymphocytes Absolute: 1.7 10*3/uL (ref 0.7–3.1)
Lymphs: 37 %
MCH: 29.1 pg (ref 26.6–33.0)
MCHC: 33 g/dL (ref 31.5–35.7)
MCV: 88 fL (ref 79–97)
Monocytes Absolute: 0.3 10*3/uL (ref 0.1–0.9)
Monocytes: 6 %
Neutrophils Absolute: 2.4 10*3/uL (ref 1.4–7.0)
Neutrophils: 53 %
Platelets: 234 10*3/uL (ref 150–450)
RBC: 4.98 x10E6/uL (ref 3.77–5.28)
RDW: 12.7 % (ref 11.7–15.4)
WBC: 4.5 10*3/uL (ref 3.4–10.8)

## 2019-10-16 LAB — VITAMIN D 1,25 DIHYDROXY
Vitamin D 1, 25 (OH)2 Total: 48 pg/mL
Vitamin D2 1, 25 (OH)2: <10 pg/mL
Vitamin D3 1, 25 (OH)2: 46 pg/mL

## 2019-10-16 LAB — T4, FREE: Free T4: 1.25 ng/dL (ref 0.82–1.77)

## 2019-10-16 LAB — TSH: TSH: 1.13 u[IU]/mL (ref 0.450–4.500)

## 2019-10-16 NOTE — Progress Notes (Signed)
Labs good. Discuss at visit 10/22/2019

## 2019-10-18 ENCOUNTER — Telehealth: Payer: Self-pay

## 2019-10-18 NOTE — Telephone Encounter (Signed)
Confirmed and screened for 10-22-19 ov.

## 2019-10-22 ENCOUNTER — Other Ambulatory Visit: Payer: Self-pay

## 2019-10-22 ENCOUNTER — Encounter: Payer: Self-pay | Admitting: Nurse Practitioner

## 2019-10-22 ENCOUNTER — Ambulatory Visit: Payer: Managed Care, Other (non HMO) | Admitting: Nurse Practitioner

## 2019-10-22 VITALS — BP 138/84 | HR 70 | Temp 97.0°F | Resp 16 | Ht 69.0 in | Wt 199.0 lb

## 2019-10-22 DIAGNOSIS — J4520 Mild intermittent asthma, uncomplicated: Secondary | ICD-10-CM | POA: Diagnosis not present

## 2019-10-22 DIAGNOSIS — E040 Nontoxic diffuse goiter: Secondary | ICD-10-CM

## 2019-10-22 DIAGNOSIS — Z0001 Encounter for general adult medical examination with abnormal findings: Secondary | ICD-10-CM | POA: Diagnosis not present

## 2019-10-22 DIAGNOSIS — R300 Dysuria: Secondary | ICD-10-CM

## 2019-10-22 DIAGNOSIS — Z6829 Body mass index (BMI) 29.0-29.9, adult: Secondary | ICD-10-CM | POA: Diagnosis not present

## 2019-10-22 DIAGNOSIS — Z1231 Encounter for screening mammogram for malignant neoplasm of breast: Secondary | ICD-10-CM

## 2019-10-22 MED ORDER — PHENTERMINE HCL 37.5 MG PO TABS: 37.5000 mg | ORAL_TABLET | Freq: Every day | ORAL | 1 refills | Status: DC

## 2019-10-22 MED ORDER — ALBUTEROL SULFATE HFA 108 (90 BASE) MCG/ACT IN AERS: 2.0000 | INHALATION_SPRAY | Freq: Four times a day (QID) | RESPIRATORY_TRACT | 3 refills | Status: DC | PRN

## 2019-10-22 NOTE — Progress Notes (Signed)
[facility] [place], [place] [address]  Internal MEDICINE  Office Visit Note  Patient Name: Colleen Richard  [id number]  [id number]  Date of Service: 10/23/2019   Pt is here for routine health maintenance examination  Chief Complaint  Patient presents with  . Annual Exam  . Asthma     The patient is here for health maintenance exam. She states that she is doing well. Has been doing intermittent fasting. Has lost approximately 15 pounds since she was last in the office. States that she has reached a "plateau." in the past, she has taken phentermine to get her back on track and losing weight again. She has tolerated this well and has been successful on this medication in the past. Blood pressure is well managed. She did have labs done prior to this visit. Lipid panel is elevated. Other labs were good. She has history of thyroid nodules. She is due to have new ultrasound for surveillance. She is also due to have screening mammogram.     Current Medication: Outpatient Encounter Medications as of 10/22/2019  Medication Sig  . albuterol (VENTOLIN HFA) 108 (90 Base) MCG/ACT inhaler Inhale 2 puffs into the lungs every 6 (six) hours as needed for wheezing or shortness of breath.  Marland Kitchen aspirin-acetaminophen-caffeine (EXCEDRIN MIGRAINE) 250-250-65 MG tablet Take 2 tablets by mouth every 6 (six) hours as needed for headache.  . estradiol (VIVELLE-DOT) 0.05 MG/24HR patch Place 1 patch (0.05 mg total) onto the skin 2 (two) times a week.  . tretinoin (RETIN-A) 0.05 % cream Apply topically at bedtime.  . [DISCONTINUED] albuterol (PROVENTIL HFA;VENTOLIN HFA) 108 (90 Base) MCG/ACT inhaler Inhale 2 puffs into the lungs every 6 (six) hours as needed for wheezing or shortness of breath.  . [DISCONTINUED] azithromycin (ZITHROMAX) 250 MG tablet z-pack - take as directed for 5 days  . phentermine (ADIPEX-P) 37.5 MG tablet Take 1 tablet (37.5 mg total) by mouth daily before breakfast.  .  [DISCONTINUED] phentermine (ADIPEX-P) 37.5 MG tablet Take 1 tablet (37.5 mg total) by mouth daily before breakfast. (Patient not taking: Reported on 10/22/2019)  . [DISCONTINUED] predniSONE (DELTASONE) 10 MG tablet Day 1 - take 6 tablets po Day 2 - take 5 tablets po Day 3 - take 4 tablets po Day 4 - take 3 tablets po Day 5 - take 2 tablets po Day 6 - take 1 tablet po (Patient not taking: Reported on 10/22/2019)  . [DISCONTINUED] zolpidem (AMBIEN) 10 MG tablet Take 1 tablet (10 mg total) by mouth at bedtime as needed for sleep. (Patient not taking: Reported on 10/22/2019)   No facility-administered encounter medications on file as of 10/22/2019.    Surgical History: Past Surgical History:  Procedure Laterality Date  . BREAST BIOPSY Right 08/26/2015   stereo  . BREAST EXCISIONAL BIOPSY Bilateral 2010   neg  . CATARACT EXTRACTION, BILATERAL  01/2013, 2015  . COLON SURGERY    . COLONOSCOPY WITH PROPOFOL N/A 11/03/2017   Procedure: COLONOSCOPY WITH PROPOFOL;  Surgeon: Virgel Manifold, MD;  Location: [facility] ENDOSCOPY;  Service: Endoscopy;  Laterality: N/A;  . EYE SURGERY    . 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 PARTIAL HYSTERECTOMY  2004  . TUBAL LIGATION      Medical History: Past Medical History:  Diagnosis Date  . Abnormal mammogram   . Asthma   . Menorrhagia   . Migraines   . Reflux esophagitis     Family History: Family  History  Problem Relation Age of Onset  . COPD Mother   . Asthma Mother   . Atrial fibrillation Mother   . Heart disease Father   . Alzheimer's disease Maternal Grandmother   . Alzheimer's disease Maternal Aunt   . Breast cancer Neg Hx       Review of Systems  Constitutional: Negative for chills, fatigue and unexpected weight change.       Has lost approximately 15 pounds since she was last seen in the office.   HENT: Negative for congestion, postnasal drip, rhinorrhea, sneezing and sore throat.   Respiratory: Negative for cough,  chest tightness, shortness of breath and wheezing.        Has mild, intermittent asthma. Needs new prescription for rescue inhaler.   Cardiovascular: Negative for chest pain and palpitations.  Gastrointestinal: Negative for abdominal pain, constipation, diarrhea, nausea and vomiting.  Endocrine: Negative for cold intolerance, heat intolerance, polydipsia and polyuria.       Nodular thyroid.  Genitourinary: Negative for dysuria, frequency and urgency.  Musculoskeletal: Negative for arthralgias, back pain, joint swelling and neck pain.  Skin: Negative for rash.  Allergic/Immunologic: Negative for environmental allergies.  Neurological: Negative for dizziness, tremors, numbness and headaches.  Hematological: Negative for adenopathy. Does not bruise/bleed easily.  Psychiatric/Behavioral: Negative for behavioral problems (Depression), sleep disturbance and suicidal ideas. The patient is not nervous/anxious.      Today's Vitals   10/22/19 1437  BP: 138/84  Pulse: 70  Resp: 16  Temp: (!) 97 F (36.1 C)  SpO2: 98%  Weight: 199 lb (90.3 kg)  Height: 5\' 9"  (1.753 m)   Body mass index is 29.39 kg/m.  Physical Exam Vitals and nursing note reviewed.  Constitutional:      General: She is not in acute distress.    Appearance: Normal appearance. She is well-developed. She is not diaphoretic.  HENT:     Head: Normocephalic and atraumatic.     Mouth/Throat:     Pharynx: No oropharyngeal exudate.  Eyes:     Pupils: Pupils are equal, round, and reactive to light.  Neck:     Thyroid: No thyromegaly.     Vascular: No JVD.     Trachea: No tracheal deviation.  Cardiovascular:     Rate and Rhythm: Normal rate and regular rhythm.     Pulses: Normal pulses.     Heart sounds: Normal heart sounds. No murmur. No friction rub. No gallop.   Pulmonary:     Effort: Pulmonary effort is normal. No respiratory distress.     Breath sounds: Normal breath sounds. No wheezing or rales.  Chest:     Chest  wall: No tenderness.     Breasts:        Right: Normal. No swelling, bleeding, inverted nipple, mass, nipple discharge, skin change or tenderness.        Left: Normal. No swelling, bleeding, inverted nipple, mass, nipple discharge or skin change.  Abdominal:     General: Bowel sounds are normal.     Palpations: Abdomen is soft.     Tenderness: There is no abdominal tenderness.  Musculoskeletal:        General: Normal range of motion.     Cervical back: Normal range of motion and neck supple.  Lymphadenopathy:     Cervical: No cervical adenopathy.     Upper Body:     Right upper body: No axillary adenopathy.     Left upper body: No axillary adenopathy.  Skin:  General: Skin is warm and dry.  Neurological:     Mental Status: She is alert and oriented to person, place, and time.     Cranial Nerves: No cranial nerve deficit.  Psychiatric:        Mood and Affect: Mood normal.        Behavior: Behavior normal.        Thought Content: Thought content normal.        Judgment: Judgment normal.      LABS: Recent Results (from the past 2160 hour(s))  Vitamin D 1,25 dihydroxy     Status: None   Collection Time: 10/04/19  9:27 AM  Result Value Ref Range   Vitamin D 1, 25 (OH)2 Total 48 pg/mL    Comment: Reference Range: Adults: 21 - 65    Vitamin D2 1, 25 (OH)2 <10 pg/mL    Comment: This test was developed and its performance characteristics determined by [facility]. It has not been cleared or approved by the Food and Drug Administration.    Vitamin D3 1, 25 (OH)2 46 pg/mL    Comment: This test was developed and its performance characteristics determined by [facility]. It has not been cleared or approved by the Food and Drug Administration.   Lipid panel     Status: Abnormal   Collection Time: 10/04/19  9:27 AM  Result Value Ref Range   Cholesterol, Total 236 (H) 100 - 199 mg/dL   Triglycerides 68 0 - 149 mg/dL   HDL 70 >39 mg/dL   VLDL Cholesterol Cal 12 5 - 40 mg/dL   LDL  Chol Calc (NIH) 154 (H) 0 - 99 mg/dL   Chol/HDL Ratio 3.4 0.0 - 4.4 ratio    Comment:                                   T. Chol/HDL Ratio                                             Men  Women                               1/2 Avg.Risk  3.4    3.3                                   Avg.Risk  5.0    4.4                                2X Avg.Risk  9.6    7.1                                3X Avg.Risk 23.4   11.0   TSH     Status: None   Collection Time: 10/04/19  9:27 AM  Result Value Ref Range   TSH 1.130 0.450 - 4.500 uIU/mL  T4, free     Status: None   Collection Time: 10/04/19  9:27 AM  Result Value Ref Range   Free T4 1.25 0.82 - 1.77 ng/dL  Comprehensive metabolic panel  Status: None   Collection Time: 10/04/19  9:27 AM  Result Value Ref Range   Glucose 96 65 - 99 mg/dL   BUN 20 6 - 24 mg/dL   Creatinine, Ser 0.90 0.57 - 1.00 mg/dL   GFR calc non Af Amer 74 >59 mL/min/1.73   GFR calc Af Amer 85 >59 mL/min/1.73   BUN/Creatinine Ratio 22 9 - 23   Sodium 142 134 - 144 mmol/L   Potassium 4.3 3.5 - 5.2 mmol/L   Chloride 106 96 - 106 mmol/L   CO2 23 20 - 29 mmol/L   Calcium 9.4 8.7 - 10.2 mg/dL   Total Protein 6.9 6.0 - 8.5 g/dL   Albumin 4.7 3.8 - 4.9 g/dL   Globulin, Total 2.2 1.5 - 4.5 g/dL   Albumin/Globulin Ratio 2.1 1.2 - 2.2   Bilirubin Total 0.3 0.0 - 1.2 mg/dL   Alkaline Phosphatase 79 39 - 117 IU/L   AST 16 0 - 40 IU/L   ALT 10 0 - 32 IU/L  CBC with Differential/Platelet     Status: None   Collection Time: 10/04/19  9:27 AM  Result Value Ref Range   WBC 4.5 3.4 - 10.8 x10E3/uL   RBC 4.98 3.77 - 5.28 x10E6/uL   Hemoglobin 14.5 11.1 - 15.9 g/dL   Hematocrit 43.9 34.0 - 46.6 %   MCV 88 79 - 97 fL   MCH 29.1 26.6 - 33.0 pg   MCHC 33.0 31.5 - 35.7 g/dL   RDW 12.7 11.7 - 15.4 %   Platelets 234 150 - 450 x10E3/uL   Neutrophils 53 Not Estab. %   Lymphs 37 Not Estab. %   Monocytes 6 Not Estab. %   Eos 3 Not Estab. %   Basos 1 Not Estab. %   Neutrophils  Absolute 2.4 1.4 - 7.0 x10E3/uL   Lymphocytes Absolute 1.7 0.7 - 3.1 x10E3/uL   Monocytes Absolute 0.3 0.1 - 0.9 x10E3/uL   EOS (ABSOLUTE) 0.1 0.0 - 0.4 x10E3/uL   Basophils Absolute 0.0 0.0 - 0.2 x10E3/uL   Immature Granulocytes 0 Not Estab. %   Immature Grans (Abs) 0.0 0.0 - 0.1 x10E3/uL  UA/M w/rflx Culture, Routine     Status: None   Collection Time: 10/22/19 12:00 AM   Specimen: Urine   URINE  Result Value Ref Range   Specific Gravity, UA 1.022 1.005 - 1.030   pH, UA 7.0 5.0 - 7.5   Color, UA Yellow Yellow   Appearance Ur Clear Clear   Leukocytes,UA Negative Negative   Protein,UA Negative Negative/Trace   Glucose, UA Negative Negative   Ketones, UA Negative Negative   RBC, UA Negative Negative   Bilirubin, UA Negative Negative   Urobilinogen, Ur 0.2 0.2 - 1.0 mg/dL   Nitrite, UA Negative Negative   Microscopic Examination Comment     Comment: Microscopic follows if indicated.   Microscopic Examination See below:     Comment: Microscopic was indicated and was performed.   Urinalysis Reflex Comment     Comment: This specimen will not reflex to a Urine Culture.  Microscopic Examination     Status: None   Collection Time: 10/22/19 12:00 AM   URINE  Result Value Ref Range   WBC, UA 0-5 0 - 5 /hpf   RBC None seen 0 - 2 /hpf   Epithelial Cells (non renal) 0-10 0 - 10 /hpf   Casts None seen None seen /lpf   Mucus, UA Present Not Estab.   Bacteria, UA None  seen None seen/Few    Assessment/Plan: 1. Encounter for general adult medical examination with abnormal findings Annual health maintenance exam today  2. Mild intermittent asthma without complication Use rescue inhaler as needed and as prescribed  - albuterol (VENTOLIN HFA) 108 (90 Base) MCG/ACT inhaler; Inhale 2 puffs into the lungs every 6 (six) hours as needed for wheezing or shortness of breath.  Dispense: 18 g; Refill: 3  3. Goiter diffuse, nontoxic Get ultrasound of thyroid for further evaluation.  - [place] Soft  Tissue Head/Neck; Future  4. BMI 29.0-29.9,adult Restart phentermine 37.5mg  tablets. May take daily to suppress appetite. Limit calorie intake to 1200 calories. May continue intermittent fasting. Should incorporate exercise into daily routine.  - phentermine (ADIPEX-P) 37.5 MG tablet; Take 1 tablet (37.5 mg total) by mouth daily before breakfast.  Dispense: 30 tablet; Refill: 1  5. Encounter for screening mammogram for malignant neoplasm of breast - MM 3D SCREEN BREAST BILATERAL; Future  6. Dysuria - UA/M w/rflx Culture, Routine  General Counseling: Mendy verbalizes understanding of the findings of todays visit and agrees with plan of treatment. I have discussed any further diagnostic evaluation that may be needed or ordered today. We also reviewed her medications today. she has been encouraged to call the office with any questions or concerns that should arise related to todays visit.    Counseling:   There is a liability release in patients' chart. There has been a 10 minute discussion about the side effects including but not limited to elevated blood pressure, anxiety, lack of sleep and dry mouth. Pt understands and will like to start/continue on appetite suppressant at this time. There will be one month RX given at the time of visit with proper follow up. Nova diet plan with restricted calories is given to the pt. Pt understands and agrees with  plan of treatment  This patient was seen by Leretha Pol FNP Collaboration with Dr Lavera Guise as a part of collaborative care agreement  Orders Placed This Encounter  Procedures  . Microscopic Examination  . US Soft Tissue Head/Neck  . MM 3D SCREEN BREAST BILATERAL  . UA/M w/rflx Culture, Routine    Meds ordered this encounter  Medications  . albuterol (VENTOLIN HFA) 108 (90 Base) MCG/ACT inhaler    Sig: Inhale 2 puffs into the lungs every 6 (six) hours as needed for wheezing or shortness of breath.    Dispense:  18 g    Refill:   3    Order Specific Question:   Supervising Provider    Answer:   Lavera Guise [id number]  . phentermine (ADIPEX-P) 37.5 MG tablet    Sig: Take 1 tablet (37.5 mg total) by mouth daily before breakfast.    Dispense:  30 tablet    Refill:  1    Order Specific Question:   Supervising Provider    Answer:   Lavera Guise [id number]    Total time spent: 42 Minutes  Time spent includes review of chart, medications, test results, and follow up plan with the patient.     Lavera Guise, MD  Internal Medicine

## 2019-10-23 DIAGNOSIS — Z0001 Encounter for general adult medical examination with abnormal findings: Secondary | ICD-10-CM | POA: Insufficient documentation

## 2019-10-23 DIAGNOSIS — Z6829 Body mass index (BMI) 29.0-29.9, adult: Secondary | ICD-10-CM | POA: Insufficient documentation

## 2019-10-23 DIAGNOSIS — J4520 Mild intermittent asthma, uncomplicated: Secondary | ICD-10-CM | POA: Insufficient documentation

## 2019-10-23 DIAGNOSIS — R300 Dysuria: Secondary | ICD-10-CM | POA: Insufficient documentation

## 2019-10-23 LAB — UA/M W/RFLX CULTURE, ROUTINE
Bilirubin, UA: NEGATIVE
Glucose, UA: NEGATIVE
Ketones, UA: NEGATIVE
Leukocytes,UA: NEGATIVE
Nitrite, UA: NEGATIVE
Protein,UA: NEGATIVE
RBC, UA: NEGATIVE
Specific Gravity, UA: 1.022 (ref 1.005–1.030)
Urobilinogen, Ur: 0.2 mg/dL (ref 0.2–1.0)
pH, UA: 7 (ref 5.0–7.5)

## 2019-10-23 LAB — MICROSCOPIC EXAMINATION
Bacteria, UA: NONE SEEN
Casts: NONE SEEN /lpf
RBC, Urine: NONE SEEN /hpf (ref 0–2)

## 2019-11-06 ENCOUNTER — Telehealth: Payer: Self-pay

## 2019-11-06 NOTE — Telephone Encounter (Signed)
Confirmed [place] appointment on 11/08/2019 and screened for covid. klh

## 2019-11-07 ENCOUNTER — Encounter: Payer: Self-pay | Admitting: Nurse Practitioner

## 2019-11-08 ENCOUNTER — Ambulatory Visit: Payer: Managed Care, Other (non HMO)

## 2019-11-08 ENCOUNTER — Other Ambulatory Visit: Payer: Self-pay

## 2019-11-08 DIAGNOSIS — E040 Nontoxic diffuse goiter: Secondary | ICD-10-CM | POA: Diagnosis not present

## 2019-11-15 ENCOUNTER — Telehealth: Payer: Self-pay

## 2019-11-15 NOTE — Progress Notes (Signed)
Mild thyromegaly with two nodules. Right nodule slightly smaller than previously precorded. Left nodule slightly larger, however, no at recommended size for biopsy. Will monitor closely.

## 2019-11-15 NOTE — Telephone Encounter (Signed)
SCREENED AND CONFIRMED FOR 11-19-19 OV.

## 2019-11-19 ENCOUNTER — Encounter: Payer: Self-pay | Admitting: Nurse Practitioner

## 2019-11-19 ENCOUNTER — Ambulatory Visit
Admission: RE | Admit: 2019-11-19 | Discharge: 2019-11-19 | Disposition: A | Discharge status: Home / Self Care | Payer: Managed Care, Other (non HMO) | Source: Ambulatory Visit | Attending: Nurse Practitioner | Admitting: Nurse Practitioner

## 2019-11-19 ENCOUNTER — Other Ambulatory Visit: Payer: Self-pay

## 2019-11-19 ENCOUNTER — Ambulatory Visit (INDEPENDENT_AMBULATORY_CARE_PROVIDER_SITE_OTHER): Payer: Managed Care, Other (non HMO) | Admitting: Nurse Practitioner

## 2019-11-19 VITALS — BP 131/90 | HR 62 | Temp 97.4°F | Resp 16 | Ht 69.0 in | Wt 200.6 lb

## 2019-11-19 DIAGNOSIS — E040 Nontoxic diffuse goiter: Secondary | ICD-10-CM

## 2019-11-19 DIAGNOSIS — Z1231 Encounter for screening mammogram for malignant neoplasm of breast: Secondary | ICD-10-CM | POA: Insufficient documentation

## 2019-11-19 NOTE — Progress Notes (Signed)
[facility] [place], [place] [address]  Internal MEDICINE  Office Visit Note  Patient Name: Colleen Richard  [id number]  [id number]  Date of Service: 11/20/2019  Chief Complaint  Patient presents with  . Follow-up    The patient is here for follow up of thyroid ultrasound. She has multinodular thyroid. There is nodules of right lobe of thyroid measuring 1.7X1.0X1.2cm in diameter. It is slightly smaller in size than most recent ultrasound. Nodule on the left lobe of the thyroid is 0.5X0.5X0.7cm in diameter. It is slightly larger than the most recent ultrasound, but does not meet criteria for biopsy at this time. The isthmus has a nodule measuring 0.8X0.6X0.9cm in diameter. There is borderline thyromegaly present. nodulse have been biopsied several years ago with benign results.       Current Medication: Outpatient Encounter Medications as of 11/19/2019  Medication Sig  . albuterol (VENTOLIN HFA) 108 (90 Base) MCG/ACT inhaler Inhale 2 puffs into the lungs every 6 (six) hours as needed for wheezing or shortness of breath.  Marland Kitchen aspirin-acetaminophen-caffeine (EXCEDRIN MIGRAINE) 250-250-65 MG tablet Take 2 tablets by mouth every 6 (six) hours as needed for headache.  . estradiol (VIVELLE-DOT) 0.05 MG/24HR patch Place 1 patch (0.05 mg total) onto the skin 2 (two) times a week.  . phentermine (ADIPEX-P) 37.5 MG tablet Take 1 tablet (37.5 mg total) by mouth daily before breakfast.  . tretinoin (RETIN-A) 0.05 % cream Apply topically at bedtime.   No facility-administered encounter medications on file as of 11/19/2019.    Surgical History: Past Surgical History:  Procedure Laterality Date  . BREAST BIOPSY Right 08/26/2015   stereo  . BREAST EXCISIONAL BIOPSY Bilateral 2010   neg  . CATARACT EXTRACTION, BILATERAL  01/2013, 2015  . COLON SURGERY    . COLONOSCOPY WITH PROPOFOL N/A 11/03/2017   Procedure: COLONOSCOPY WITH PROPOFOL;  Surgeon: Virgel Manifold, MD;   Location: [facility] ENDOSCOPY;  Service: Endoscopy;  Laterality: N/A;  . EYE SURGERY    . 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 PARTIAL HYSTERECTOMY  2004  . TUBAL LIGATION      Medical History: Past Medical History:  Diagnosis Date  . Abnormal mammogram   . Asthma   . Menorrhagia   . Migraines   . Reflux esophagitis     Family History: Family History  Problem Relation Age of Onset  . COPD Mother   . Asthma Mother   . Atrial fibrillation Mother   . Heart disease Father   . Alzheimer's disease Maternal Grandmother   . Alzheimer's disease Maternal Aunt   . Breast cancer Neg Hx     Social History   Socioeconomic History  . Marital status: Married    Spouse name: Not on file  . Number of children: Not on file  . Years of education: Not on file  . Highest education level: Not on file  Occupational History  . Not on file  Tobacco Use  . Smoking status: Never Smoker  . Smokeless tobacco: Never Used  Substance and Sexual Activity  . Alcohol use: No  . Drug use: No  . Sexual activity: Yes  Other Topics Concern  . Not on file  Social History Narrative  . Not on file   Social Determinants of Health   Financial Resource Strain:   . Difficulty of Paying Living Expenses: Not on file  Food Insecurity:   . Worried About Charity fundraiser in the  Last Year: Not on file  . Ran Out of Food in the Last Year: Not on file  Transportation Needs:   . Lack of Transportation (Medical): Not on file  . Lack of Transportation (Non-Medical): Not on file  Physical Activity:   . Days of Exercise per Week: Not on file  . Minutes of Exercise per Session: Not on file  Stress:   . Feeling of Stress : Not on file  Social Connections:   . Frequency of Communication with Friends and Family: Not on file  . Frequency of Social Gatherings with Friends and Family: Not on file  . Attends Religious Services: Not on file  . Active Member of Clubs or Organizations: Not on  file  . Attends Archivist Meetings: Not on file  . Marital Status: Not on file  Intimate Partner Violence:   . Fear of Current or Ex-Partner: Not on file  . Emotionally Abused: Not on file  . Physically Abused: Not on file  . Sexually Abused: Not on file      Review of Systems  Constitutional: Negative for activity change, chills, fatigue and unexpected weight change.  HENT: Negative for congestion, postnasal drip, rhinorrhea, sneezing and sore throat.   Respiratory: Negative for cough, chest tightness, shortness of breath and wheezing.   Cardiovascular: Negative for chest pain and palpitations.  Gastrointestinal: Negative for abdominal pain, constipation, diarrhea, nausea and vomiting.  Endocrine: Negative for cold intolerance, heat intolerance, polydipsia and polyuria.  Musculoskeletal: Negative for arthralgias, back pain, joint swelling and neck pain.  Skin: Negative for rash.  Allergic/Immunologic: Negative for environmental allergies.  Neurological: Negative for dizziness, tremors, numbness and headaches.  Hematological: Negative for adenopathy. Does not bruise/bleed easily.  Psychiatric/Behavioral: Negative for behavioral problems (Depression), sleep disturbance and suicidal ideas. The patient is not nervous/anxious.     Today's Vitals   11/19/19 1100  BP: 131/90  Pulse: 62  Resp: 16  Temp: (!) 97.4 F (36.3 C)  SpO2: 98%  Weight: 200 lb 9.6 oz (91 kg)  Height: 5\' 9"  (1.753 m)   Body mass index is 29.62 kg/m.  Physical Exam Vitals and nursing note reviewed.  Constitutional:      General: She is not in acute distress.    Appearance: Normal appearance. She is well-developed. She is not diaphoretic.  HENT:     Head: Normocephalic and atraumatic.     Mouth/Throat:     Pharynx: No oropharyngeal exudate.  Eyes:     Pupils: Pupils are equal, round, and reactive to light.  Neck:     Thyroid: Thyromegaly present.     Vascular: No carotid bruit or JVD.      Trachea: No tracheal deviation.  Cardiovascular:     Rate and Rhythm: Normal rate and regular rhythm.     Heart sounds: Normal heart sounds. No murmur. No friction rub. No gallop.   Pulmonary:     Effort: Pulmonary effort is normal. No respiratory distress.     Breath sounds: Normal breath sounds. No wheezing or rales.  Chest:     Chest wall: No tenderness.  Abdominal:     Palpations: Abdomen is soft.  Musculoskeletal:        General: Normal range of motion.     Cervical back: Normal range of motion and neck supple.  Lymphadenopathy:     Cervical: No cervical adenopathy.  Skin:    General: Skin is warm and dry.  Neurological:     Mental Status: She is  alert and oriented to person, place, and time.     Cranial Nerves: No cranial nerve deficit.  Psychiatric:        Behavior: Behavior normal.        Thought Content: Thought content normal.        Judgment: Judgment normal.    Assessment/Plan: 1. Goiter diffuse, nontoxic Reviewed recent thyroid ultrasound with patient. She has nodule of right lobe of thyroid measuring 1.7X1.0X1.2cm in diameter. It is slightly smaller in size than most recent ultrasound. Nodule on the left lobe of the thyroid is 0.5X0.5X0.7cm in diameter. It is slightly larger than the most recent ultrasound, but does not meet criteria for biopsy at this time. The isthmus has a nodule measuring 0.8X0.6X0.9cm in diameter. There is borderline thyromegaly present. - US THYROID; Future  General Counseling: Chyrl verbalizes understanding of the findings of todays visit and agrees with plan of treatment. I have discussed any further diagnostic evaluation that may be needed or ordered today. We also reviewed her medications today. she has been encouraged to call the office with any questions or concerns that should arise related to todays visit.  This patient was seen by Leretha Pol FNP Collaboration with Dr Lavera Guise as a part of collaborative care agreement  Orders  Placed This Encounter  Procedures  . US THYROID      Total time spent: 20 Minutes   Time spent includes review of chart, medications, test results, and follow up plan with the patient.      Dr Lavera Guise Internal medicine

## 2019-11-20 ENCOUNTER — Other Ambulatory Visit: Payer: Self-pay | Admitting: Nurse Practitioner

## 2019-11-20 DIAGNOSIS — R928 Other abnormal and inconclusive findings on diagnostic imaging of breast: Secondary | ICD-10-CM

## 2019-11-20 DIAGNOSIS — N6320 Unspecified lump in the left breast, unspecified quadrant: Secondary | ICD-10-CM

## 2019-11-20 NOTE — Progress Notes (Signed)
Incomplete findings. Review prior imaging for comparison.

## 2019-12-03 ENCOUNTER — Other Ambulatory Visit: Payer: Self-pay | Admitting: Nurse Practitioner

## 2019-12-03 ENCOUNTER — Ambulatory Visit
Admission: RE | Admit: 2019-12-03 | Discharge: 2019-12-03 | Disposition: A | Discharge status: Home / Self Care | Payer: Managed Care, Other (non HMO) | Source: Ambulatory Visit | Attending: Nurse Practitioner | Admitting: Nurse Practitioner

## 2019-12-03 DIAGNOSIS — R928 Other abnormal and inconclusive findings on diagnostic imaging of breast: Secondary | ICD-10-CM

## 2019-12-03 DIAGNOSIS — N6320 Unspecified lump in the left breast, unspecified quadrant: Secondary | ICD-10-CM

## 2019-12-03 DIAGNOSIS — N6310 Unspecified lump in the right breast, unspecified quadrant: Secondary | ICD-10-CM

## 2019-12-10 ENCOUNTER — Ambulatory Visit
Admission: RE | Admit: 2019-12-10 | Discharge: 2019-12-10 | Disposition: A | Discharge status: Home / Self Care | Payer: Managed Care, Other (non HMO) | Source: Ambulatory Visit | Attending: Nurse Practitioner | Admitting: Nurse Practitioner

## 2019-12-10 ENCOUNTER — Other Ambulatory Visit: Payer: Self-pay | Admitting: Nurse Practitioner

## 2019-12-10 ENCOUNTER — Other Ambulatory Visit: Payer: Self-pay

## 2019-12-10 DIAGNOSIS — R928 Other abnormal and inconclusive findings on diagnostic imaging of breast: Secondary | ICD-10-CM | POA: Insufficient documentation

## 2019-12-10 DIAGNOSIS — N6310 Unspecified lump in the right breast, unspecified quadrant: Secondary | ICD-10-CM

## 2019-12-10 HISTORY — PX: BREAST BIOPSY: SHX20

## 2019-12-12 ENCOUNTER — Other Ambulatory Visit: Payer: Self-pay | Admitting: Anatomic Pathology & Clinical Pathology

## 2019-12-12 ENCOUNTER — Other Ambulatory Visit: Payer: Self-pay

## 2019-12-12 DIAGNOSIS — C50919 Malignant neoplasm of unspecified site of unspecified female breast: Secondary | ICD-10-CM

## 2019-12-12 NOTE — Progress Notes (Signed)
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

## 2019-12-12 NOTE — Progress Notes (Signed)
Positive DCIS

## 2019-12-13 NOTE — Progress Notes (Signed)
Received notification from [place] that patient has received biopsy results.  Phoned patient to introduce,   and begin navigation by scheduling Surgical and Med/Onc consults.

## 2019-12-14 DIAGNOSIS — C50411 Malignant neoplasm of upper-outer quadrant of right female breast: Secondary | ICD-10-CM | POA: Insufficient documentation

## 2019-12-14 NOTE — Progress Notes (Signed)
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  INTERVAL HISTORY: Patient is a 54 year old female who recently underwent breast biopsy for an abnormality noted on mammogram and found to have the above-stated malignancy.  Patient has had multiple biopsies in the past on both her right and left breast, but all have been negative for malignancy.  She currently feels well and is asymptomatic.  She has no neurologic complaints.  She denies any recent fevers or illnesses.  She has a good appetite and denies weight loss.  She has no chest pain, shortness of breath, cough, or hemoptysis.  She denies any nausea, vomiting, constipation, or diarrhea.  She has no urinary complaints.  Patient feels at her baseline offers no specific complaints today.  REVIEW OF SYSTEMS:   Review of Systems  Constitutional: Negative.  Negative for fever, malaise/fatigue and weight loss.  Respiratory: Negative.  Negative for cough, hemoptysis and shortness of breath.   Cardiovascular: Negative.  Negative for chest pain and leg swelling.  Gastrointestinal: Negative.  Negative for abdominal pain.  Genitourinary: Negative.  Negative for dysuria.  Musculoskeletal: Negative.  Negative for back pain.  Skin: Negative.  Negative for rash.  Neurological: Negative.  Negative for dizziness, focal weakness, weakness and headaches.  Psychiatric/Behavioral: Negative.  The patient is not nervous/anxious.     As per HPI. Otherwise, a complete review of systems is negative.  PAST MEDICAL HISTORY: Past Medical History:  Diagnosis Date  . Abnormal mammogram   . Asthma   . Menorrhagia   . Migraines   . Reflux esophagitis      PAST SURGICAL HISTORY: Past Surgical History:  Procedure Laterality Date  . BREAST BIOPSY Right 08/26/2015   stereo, top hat clip   . BREAST BIOPSY Right 12/10/2019   affirm, x marker pending path   . BREAST EXCISIONAL BIOPSY Bilateral 2010   neg  . CATARACT EXTRACTION, BILATERAL  01/2013, 2015  . COLON SURGERY    . COLONOSCOPY WITH PROPOFOL N/A 11/03/2017   Procedure: COLONOSCOPY WITH PROPOFOL;  Surgeon: Virgel Manifold, MD;  Location: [facility] ENDOSCOPY;  Service: Endoscopy;  Laterality: N/A;  . EYE SURGERY    . 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 PARTIAL HYSTERECTOMY  2004  . TUBAL LIGATION      FAMILY HISTORY: Family History  Problem Relation Age of Onset  . COPD Mother   . Asthma Mother   . Atrial fibrillation Mother   . Heart disease Father   . Alzheimer's disease Maternal Grandmother   . Alzheimer's disease Maternal Aunt   . Breast cancer Neg Hx     ADVANCED DIRECTIVES (Y/N):  N  HEALTH MAINTENANCE: Social History   Tobacco Use  . Smoking status: Never Smoker  . Smokeless tobacco: Never Used  Substance Use Topics  . Alcohol use: No  . Drug use: No     Colonoscopy:  PAP:  Bone density:  Lipid panel:  No Known Allergies  Current Outpatient Medications  Medication Sig Dispense Refill  . albuterol (VENTOLIN HFA) 108 (90 Base) MCG/ACT inhaler Inhale 2 puffs into the lungs every 6 (six) hours as needed for wheezing or shortness of breath. 18 g 3  .  aspirin-acetaminophen-caffeine (EXCEDRIN MIGRAINE) 250-250-65 MG tablet Take 2 tablets by mouth every 6 (six) hours as needed for headache.    . tretinoin (RETIN-A) 0.05 % cream Apply topically at bedtime. 45 g 0  . phentermine (ADIPEX-P) 37.5 MG tablet Take 1 tablet (37.5 mg total) by mouth daily before breakfast. (Patient not taking: Reported on 12/17/2019) 30 tablet 1   No current facility-administered medications for this visit.    OBJECTIVE: Vitals:   12/17/19 1127  BP:  128/90  Pulse: 77  Resp: 18  Temp: (!) 97.2 F (36.2 C)  SpO2: 100%     Body mass index is 29.42 kg/m.    ECOG FS:0 - Asymptomatic  General: Well-developed, well-nourished, no acute distress. Eyes: Pink conjunctiva, anicteric sclera. HEENT: Normocephalic, moist mucous membranes. Breast: No palpable masses.  Exam deferred today.   Lungs: No audible wheezing or coughing. Heart: Regular rate and rhythm. Abdomen: Soft, nontender, no obvious distention. Musculoskeletal: No edema, cyanosis, or clubbing. Neuro: Alert, answering all questions appropriately. Cranial nerves grossly intact. Skin: No rashes or petechiae noted. Psych: Normal affect. Lymphatics: No cervical, calvicular, axillary or inguinal LAD.   LAB RESULTS:  Lab Results  Component Value Date   NA 142 10/04/2019   K 4.3 10/04/2019   CL 106 10/04/2019   CO2 23 10/04/2019   GLUCOSE 96 10/04/2019   BUN 20 10/04/2019   CREATININE 0.90 10/04/2019   CALCIUM 9.4 10/04/2019   PROT 6.9 10/04/2019   ALBUMIN 4.7 10/04/2019   AST 16 10/04/2019   ALT 10 10/04/2019   ALKPHOS 79 10/04/2019   BILITOT 0.3 10/04/2019   GFRNONAA 74 10/04/2019   GFRAA 85 10/04/2019    Lab Results  Component Value Date   WBC 4.5 10/04/2019   NEUTROABS 2.4 10/04/2019   HGB 14.5 10/04/2019   HCT 43.9 10/04/2019   MCV 88 10/04/2019   PLT 234 10/04/2019     STUDIES: US BREAST LTD UNI LEFT INC AXILLA  Result Date: 12/03/2019 CLINICAL DATA:  54 year old female for further evaluation of possible RIGHT breast distortion of possible LEFT breast mass on screening mammogram. EXAM: DIGITAL DIAGNOSTIC BILATERAL MAMMOGRAM WITH CAD AND TOMO ULTRASOUND BILATERAL BREAST COMPARISON:  Previous exam(s). ACR Breast Density Category c: The breast tissue is heterogeneously dense, which may obscure small masses. FINDINGS: 2D/3D full field and spot compression views of the RIGHT breast and spot compression views of the LEFT breast are performed. There is a  persistent 0.6 cm irregular mass within the UPPER OUTER RIGHT breast on multiple spot compression views. A persistent 1.3 cm circumscribed oval mass is identified within the UPPER-OUTER LEFT breast. Mammographic images were processed with CAD. Targeted ultrasound is performed, showing a 0.5 x 0.6 x 0.5 cm irregular hypoechoic shadowing mass at the 10:30 position of the RIGHT breast 8 cm from the nipple corresponding to the mammographic mass. No abnormal RIGHT axillary lymph nodes are identified. A 1 x 0.6 x 1.3 cm benign simple cyst at the 1:30 position of the LEFT breast 7 cm from the nipple is identified, corresponding to the LEFT breast screening study finding. IMPRESSION: 1. 0.6 cm suspicious mass within the UPPER-OUTER RIGHT breast. Tissue sampling is recommended. 2. No abnormal RIGHT axillary lymph nodes. 3. Benign cyst within the UPPER-OUTER LEFT breast, corresponding to the LEFT breast screening study finding. RECOMMENDATION: Ultrasound-guided RIGHT breast biopsy, which will be arranged. I have discussed the findings and recommendations with the patient. If applicable, a reminder letter will be sent to the patient regarding the  next appointment. BI-RADS CATEGORY  4: Suspicious. Electronically Signed   By: Margarette Canada M.D.   On: 12/03/2019 14:36   US BREAST LTD UNI RIGHT INC AXILLA  Result Date: 12/03/2019 CLINICAL DATA:  54 year old female for further evaluation of possible RIGHT breast distortion of possible LEFT breast mass on screening mammogram. EXAM: DIGITAL DIAGNOSTIC BILATERAL MAMMOGRAM WITH CAD AND TOMO ULTRASOUND BILATERAL BREAST COMPARISON:  Previous exam(s). ACR Breast Density Category c: The breast tissue is heterogeneously dense, which may obscure small masses. FINDINGS: 2D/3D full field and spot compression views of the RIGHT breast and spot compression views of the LEFT breast are performed. There is a persistent 0.6 cm irregular mass within the UPPER OUTER RIGHT breast on multiple spot  compression views. A persistent 1.3 cm circumscribed oval mass is identified within the UPPER-OUTER LEFT breast. Mammographic images were processed with CAD. Targeted ultrasound is performed, showing a 0.5 x 0.6 x 0.5 cm irregular hypoechoic shadowing mass at the 10:30 position of the RIGHT breast 8 cm from the nipple corresponding to the mammographic mass. No abnormal RIGHT axillary lymph nodes are identified. A 1 x 0.6 x 1.3 cm benign simple cyst at the 1:30 position of the LEFT breast 7 cm from the nipple is identified, corresponding to the LEFT breast screening study finding. IMPRESSION: 1. 0.6 cm suspicious mass within the UPPER-OUTER RIGHT breast. Tissue sampling is recommended. 2. No abnormal RIGHT axillary lymph nodes. 3. Benign cyst within the UPPER-OUTER LEFT breast, corresponding to the LEFT breast screening study finding. RECOMMENDATION: Ultrasound-guided RIGHT breast biopsy, which will be arranged. I have discussed the findings and recommendations with the patient. If applicable, a reminder letter will be sent to the patient regarding the next appointment. BI-RADS CATEGORY  4: Suspicious. Electronically Signed   By: Margarette Canada M.D.   On: 12/03/2019 14:36   MM DIAG BREAST TOMO BILATERAL  Result Date: 12/03/2019 CLINICAL DATA:  53 year old female for further evaluation of possible RIGHT breast distortion of possible LEFT breast mass on screening mammogram. EXAM: DIGITAL DIAGNOSTIC BILATERAL MAMMOGRAM WITH CAD AND TOMO ULTRASOUND BILATERAL BREAST COMPARISON:  Previous exam(s). ACR Breast Density Category c: The breast tissue is heterogeneously dense, which may obscure small masses. FINDINGS: 2D/3D full field and spot compression views of the RIGHT breast and spot compression views of the LEFT breast are performed. There is a persistent 0.6 cm irregular mass within the UPPER OUTER RIGHT breast on multiple spot compression views. A persistent 1.3 cm circumscribed oval mass is identified within the  UPPER-OUTER LEFT breast. Mammographic images were processed with CAD. Targeted ultrasound is performed, showing a 0.5 x 0.6 x 0.5 cm irregular hypoechoic shadowing mass at the 10:30 position of the RIGHT breast 8 cm from the nipple corresponding to the mammographic mass. No abnormal RIGHT axillary lymph nodes are identified. A 1 x 0.6 x 1.3 cm benign simple cyst at the 1:30 position of the LEFT breast 7 cm from the nipple is identified, corresponding to the LEFT breast screening study finding. IMPRESSION: 1. 0.6 cm suspicious mass within the UPPER-OUTER RIGHT breast. Tissue sampling is recommended. 2. No abnormal RIGHT axillary lymph nodes. 3. Benign cyst within the UPPER-OUTER LEFT breast, corresponding to the LEFT breast screening study finding. RECOMMENDATION: Ultrasound-guided RIGHT breast biopsy, which will be arranged. I have discussed the findings and recommendations with the patient. If applicable, a reminder letter will be sent to the patient regarding the next appointment. BI-RADS CATEGORY  4: Suspicious. Electronically Signed   By: Dellis Filbert  Hu M.D.   On: 12/03/2019 14:36   MM 3D SCREEN BREAST BILATERAL  Result Date: 11/19/2019 CLINICAL DATA:  Screening. EXAM: DIGITAL SCREENING BILATERAL MAMMOGRAM WITH TOMO AND CAD COMPARISON:  Previous exam(s). ACR Breast Density Category c: The breast tissue is heterogeneously dense, which may obscure small masses. FINDINGS: In the right breast distortion requires further evaluation. In the left breast mass requires further evaluation. Images were processed with CAD. IMPRESSION: Further evaluation is suggested for possible distortion in the right breast. Further evaluation is suggested for possible mass in the left breast. RECOMMENDATION: Diagnostic mammogram and possibly ultrasound of both breasts. (Code:[id number]-B-42M) The patient will be contacted regarding the findings, and additional imaging will be scheduled. BI-RADS CATEGORY  0: Incomplete. Need additional imaging  evaluation and/or prior mammograms for comparison. Electronically Signed   By: Everlean Alstrom M.D.   On: 11/19/2019 16:53   MM CLIP PLACEMENT RIGHT  Result Date: 12/10/2019 CLINICAL DATA:  54 year old female presenting for biopsy of right breast distortion. EXAM: DIAGNOSTIC RIGHT MAMMOGRAM POST STEREOTACTIC BIOPSY COMPARISON:  Previous exam(s). FINDINGS: Mammographic images were obtained following stereotactic guided biopsy of distortion in the upper-outer quadrant of the right breast at 10:30 o'clock. The X biopsy marking clip is in expected position at the site of biopsy. IMPRESSION: Appropriate positioning of the X shaped biopsy marking clip at the site of biopsy in the upper outer right breast at 10:30 o'clock. Final Assessment: Post Procedure Mammograms for Marker Placement Electronically Signed   By: Audie Pinto M.D.   On: 12/10/2019 11:36   MM RT BREAST BX W LOC DEV 1ST LESION IMAGE BX SPEC STEREO GUIDE  Addendum Date: 12/12/2019   ADDENDUM REPORT: 12/12/2019 15:30 ADDENDUM: PATHOLOGY revealed: A. BREAST, RIGHT AT 10:30; STEREOTACTIC CORE NEEDLE BIOPSY: - INVASIVE MAMMARY CARCINOMA, NO SPECIAL TYPE. At least 10 mm in this sample. Grade 1. Ductal carcinoma in situ: Present, low-grade. Lymphovascular invasion: Not identified. Pathology results are CONCORDANT with imaging findings, per Dr. Audie Pinto. Pathology results and recommendations below were discussed with patient by telephone on 12/12/2019. Patient reported biopsy site doing well with slight tenderness at the site. Post biopsy care instructions were reviewed and questions were answered. Patient was instructed to call [facility] if any concerns or questions arise related to the biopsy. Recommendation: Surgical referral. Request for surgical referral was relayed to [place] and Tanya Nones RN at [facility] by Electa Sniff RN on 12/12/2019. Addendum by Electa Sniff RN on 12/12/2019. Electronically  Signed   By: Audie Pinto M.D.   On: 12/12/2019 15:30   Result Date: 12/12/2019 CLINICAL DATA:  54 year old female presenting for biopsy of right breast distortion. EXAM: RIGHT BREAST STEREOTACTIC CORE NEEDLE BIOPSY COMPARISON:  Previous exams. FINDINGS: The patient was first taken to ultrasound for biopsy of shadowing in the right breast at 10:30 o'clock thought to correspond to the distortion seen on mammogram. Sonographically this area was less conspicuous today. Given the better visualization of the distortion on mammogram, biopsy was converted to stereotactic guidance. The patient and I discussed the procedure of stereotactic-guided biopsy including benefits and alternatives. We discussed the high likelihood of a successful procedure. We discussed the risks of the procedure including infection, bleeding, tissue injury, clip migration, and inadequate sampling. Informed written consent was given. The usual time out protocol was performed immediately prior to the procedure. Using sterile technique and 1% Lidocaine as local anesthetic, under stereotactic guidance, a 9 gauge vacuum assisted device was used to perform core  needle biopsy of distortion in the upper-outer quadrant of the right breast at 10:30 o'clock using a superior approach. Lesion quadrant: Upper outer quadrant At the conclusion of the procedure, an X tissue marker clip was deployed into the biopsy cavity. Follow-up 2-view mammogram was performed and dictated separately. IMPRESSION: Stereotactic-guided biopsy of distortion in the upper-outer quadrant of the right breast. No apparent complications. Electronically Signed: By: Audie Pinto M.D. 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  Given the small size of the lesion on mammogram, 6 mm, patient  will require surgery upfront and has an appointment with Dr. Christian Mate later this week.  No further intervention is needed at this time.  Patient will return to clinic 1 week after her surgery to discuss her final pathology results and treatment planning.  I spent a total of 60 minutes reviewing chart data, face-to-face evaluation with the patient, counseling and coordination of care as detailed above.   Patient expressed understanding and was in agreement with this plan. She also understands that She can call clinic at any time with any questions, concerns, or complaints.   Cancer Staging No matching staging information was found for the patient.  Lloyd Huger, MD   12/17/2019 1:28 PM

## 2019-12-17 ENCOUNTER — Other Ambulatory Visit: Payer: Self-pay

## 2019-12-17 ENCOUNTER — Encounter: Payer: Self-pay | Admitting: Oncology

## 2019-12-17 ENCOUNTER — Inpatient Hospital Stay: Payer: Managed Care, Other (non HMO) | Attending: Oncology | Admitting: Oncology

## 2019-12-17 DIAGNOSIS — Z79899 Other long term (current) drug therapy: Secondary | ICD-10-CM | POA: Diagnosis not present

## 2019-12-17 DIAGNOSIS — Z170 Estrogen receptor positive status [ER+]: Secondary | ICD-10-CM | POA: Insufficient documentation

## 2019-12-17 DIAGNOSIS — C50411 Malignant neoplasm of upper-outer quadrant of right female breast: Secondary | ICD-10-CM | POA: Diagnosis not present

## 2019-12-18 NOTE — Progress Notes (Signed)
Supported patient, husband at initial med/onc visit.  Given Breast Cancer Treatment Handbook. Patient scheduled with Dr. Christian Mate for surgical consult on 12/19/19.  ER/PR HER2 results pending per pathology.

## 2019-12-19 ENCOUNTER — Other Ambulatory Visit: Payer: Self-pay

## 2019-12-19 ENCOUNTER — Ambulatory Visit: Payer: Managed Care, Other (non HMO) | Admitting: Nurse Practitioner

## 2019-12-19 ENCOUNTER — Other Ambulatory Visit: Payer: Self-pay | Admitting: Surgery

## 2019-12-19 ENCOUNTER — Ambulatory Visit (INDEPENDENT_AMBULATORY_CARE_PROVIDER_SITE_OTHER): Payer: Managed Care, Other (non HMO) | Admitting: Surgery

## 2019-12-19 ENCOUNTER — Ambulatory Visit: Payer: Self-pay | Admitting: Surgery

## 2019-12-19 ENCOUNTER — Encounter: Payer: Self-pay | Admitting: Surgery

## 2019-12-19 VITALS — BP 160/83 | HR 61 | Temp 95.4°F | Ht 69.0 in | Wt 200.2 lb

## 2019-12-19 DIAGNOSIS — C801 Malignant (primary) neoplasm, unspecified: Secondary | ICD-10-CM

## 2019-12-19 DIAGNOSIS — E042 Nontoxic multinodular goiter: Secondary | ICD-10-CM

## 2019-12-19 DIAGNOSIS — C50411 Malignant neoplasm of upper-outer quadrant of right female breast: Secondary | ICD-10-CM

## 2019-12-19 DIAGNOSIS — C50011 Malignant neoplasm of nipple and areola, right female breast: Secondary | ICD-10-CM

## 2019-12-19 HISTORY — DX: Nontoxic multinodular goiter: E04.2

## 2019-12-19 HISTORY — DX: Malignant (primary) neoplasm, unspecified: C80.1

## 2019-12-19 NOTE — Patient Instructions (Addendum)
Our surgery scheduler Marzetta Board will contact you within the next 24-48 hours. During that call, Marzetta Board will discuss the preparation prior to surgery. She will also discuss the different dates and times to get scheduled. Please have the BLUE sheet available when she contacts you. If you have any concerns or questions, please do not hesitate to give our office a call.  Lumpectomy  A lumpectomy, sometimes called a partial mastectomy, is surgery to remove a cancerous tumor or mass (the lump) from a breast. It is a form of breast-conserving or breast-preservation surgery. This means that the cancerous tissue is removed but the breast remains intact. During a lumpectomy, the portion of the breast that contains the tumor is removed. Some normal tissue around the lump may be taken out to make sure that all of the tumor has been removed. Lymph nodes under your arm may also be removed and tested to find out if the cancer has spread. Lymph nodes are part of the body's disease-fighting system (immune system) and are usually the first place where breast cancer spreads. Tell a health care provider about:  Any allergies you have.  All medicines you are taking, including vitamins, herbs, eye drops, creams, and over-the-counter medicines.  Any problems you or family members have had with anesthetic medicines.  Any blood disorders you have.  Any surgeries you have had.  Any medical conditions you have.  Whether you are pregnant or may be pregnant. What are the risks? Generally, this is a safe procedure. However, problems may occur, including:  Bleeding.  Infection.  Allergic reaction to medicines.  Pain, swelling, weakness, or numbness in the arm on the side of your surgery.  Temporary swelling.  Change in the shape of the breast, particularly if a large portion is removed.  Scar tissue that forms at the surgical site and feels hard to the touch.  Blood clots. What happens before the  procedure? Staying hydrated Follow instructions from your health care provider about hydration, which may include:  Up to 2 hours before the procedure - you may continue to drink clear liquids, such as water, clear fruit juice, black coffee, and plain tea.  Eating and drinking restrictions Follow instructions from your health care provider about eating and drinking, which may include:  8 hours before the procedure - stop eating heavy meals or foods, such as meat, fried foods, or fatty foods.  6 hours before the procedure - stop eating light meals or foods, such as toast or cereal.  6 hours before the procedure - stop drinking milk or drinks that contain milk.  2 hours before the procedure - stop drinking clear liquids. Medicines Ask your health care provider about:  Changing or stopping your regular medicines. This is especially important if you are taking diabetes medicines or blood thinners.  Taking medicines such as aspirin and ibuprofen. These medicines can thin your blood. Do not take these medicines unless your health care provider tells you to take them.  Taking over-the-counter medicines, vitamins, herbs, and supplements. General instructions  Prior to surgery, your health care provider may do a procedure to locate and mark the tumor area in your breast (localization). This will help guide your surgeon to where the incision will be made. This may be done with: ? Imaging, such as a mammogram, ultrasound, or MRI. ? Insertion of a small wire, clip, or seed, or an implant that will reflect a radar signal.  You may have screening tests or exams to get baseline measurements  of your arm. These can be compared to measurements done after surgery to monitor for swelling (lymphedema) that can develop after having lymph nodes removed.  Ask your health care provider: ? How your surgery site will be marked. ? What steps will be taken to help prevent infection. These may  include:  Washing skin with a germ-killing soap.  Taking antibiotic medicine.  Plan to have someone take you home from the hospital or clinic.  Plan to have a responsible adult care for you for at least 24 hours after you leave the hospital or clinic. This is important. What happens during the procedure?   An IV will be inserted into one of your veins.  You will be given one or more of the following: ? A medicine to help you relax (sedative). ? A medicine to numb the area (local anesthetic). ? A medicine to make you fall asleep (general anesthetic).  Your health care provider will use a kind of electric scalpel that uses heat to reduce bleeding (electrocautery knife). A curved incision that follows the natural curve of your breast will be made. This type of incision will allow for minimal scarring and better healing.  The tumor will be removed along with some of the tissue around it. This will be sent to the lab for testing. Your health care provider may also remove lymph nodes at this time if needed.  If the tumor is close to the muscles over your chest, some muscle tissue may also be removed.  A small drain tube may be inserted into your breast area or armpit to collect fluid that may build up after surgery. This tube will be connected to a suction bulb on the outside of your body to remove the fluid.  The incision will be closed with stitches (sutures).  A bandage (dressing) may be placed over the incision. The procedure may vary among health care providers and hospitals. What happens after the procedure?  Your blood pressure, heart rate, breathing rate, and blood oxygen level will be monitored until you leave the hospital or clinic.  You will be given medicine for pain as needed.  Your IV will be removed when you are able to eat and drink by mouth.  You will be encouraged to get up and walk as soon as you can. This is important to improve blood flow and breathing. Ask for  help if you feel weak or unsteady.  You may have: ? A drain tube in place for 2-3 days to prevent a collection of blood (hematoma) from developing in the breast. You will be given instructions about caring for the drain before you go home. ? A pressure bandage applied for 1-2 days to prevent bleeding or swelling. Your pressure bandage may look like a thick piece of fabric or an elastic wrap. Ask your health care provider how to care for your bandage at home.  You may be given a tight sleeve to wear over your arm on the side of your surgery. You should wear this sleeve as told by your health care provider.  Do not drive for 24 hours if you were given a sedative during your procedure. Summary  A lumpectomy, sometimes called a partial mastectomy, is surgery to remove a cancerous tumor or mass (the lump) from a breast.  During a lumpectomy, the portion of the breast that contains the tumor is removed. Lymph nodes under your arm may also be removed and tested to find out if the cancer has  spread.  Plan to have someone take you home from the hospital or clinic.  You may have a drain tube in place for 2-3 days to prevent a collection of blood (hematoma) from developing in the breast. You will be given instructions about caring for the drain before you go home. This information is not intended to replace advice given to you by your health care provider. Make sure you discuss any questions you have with your health care provider. Document Revised: 03/11/2019 Document Reviewed: 03/11/2019 Elsevier Patient Education  [place].

## 2019-12-19 NOTE — Progress Notes (Signed)
Patient ID: Colleen Richard, female   DOB: [birth year], 54 y.o.   MRN: [id number]  Chief Complaint: Right breast cancer  History of Present Illness Colleen Richard is a 54 y.o. female with abnormal right breast screening imaging results again an ultrasound-guided core biopsy.  Prognostic indicators still pending.  Subcentimeter size of lesion.  Prior history of breast biopsies all benign in the past.  She has no family history of breast cancer.  2 pregnancies had her first child at the age of 61.  she denies any other breast symptoms, pain lump or skin changes.  Past Medical History Past Medical History:  Diagnosis Date  . Abnormal mammogram   . Asthma   . Menorrhagia   . Migraines   . Reflux esophagitis       Past Surgical History:  Procedure Laterality Date  . BREAST BIOPSY Right 08/26/2015   stereo, top hat clip   . BREAST BIOPSY Right 12/10/2019   affirm, x marker pending path   . BREAST EXCISIONAL BIOPSY Bilateral 2010   neg  . CATARACT EXTRACTION, BILATERAL  01/2013, 2015  . COLON SURGERY    . COLONOSCOPY WITH PROPOFOL N/A 11/03/2017   Procedure: COLONOSCOPY WITH PROPOFOL;  Surgeon: Virgel Manifold, MD;  Location: [facility] ENDOSCOPY;  Service: Endoscopy;  Laterality: N/A;  . EYE SURGERY    . 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 PARTIAL HYSTERECTOMY  2004  . TUBAL LIGATION      No Known Allergies  Current Outpatient Medications  Medication Sig Dispense Refill  . albuterol (VENTOLIN HFA) 108 (90 Base) MCG/ACT inhaler Inhale 2 puffs into the lungs every 6 (six) hours as needed for wheezing or shortness of breath. 18 g 3  . tretinoin (RETIN-A) 0.05 % cream Apply topically at bedtime. 45 g 0  . aspirin-acetaminophen-caffeine (EXCEDRIN MIGRAINE) 250-250-65 MG tablet Take 2 tablets by mouth every 6 (six) hours as needed for headache.    . phentermine (ADIPEX-P) 37.5 MG tablet Take 1 tablet (37.5 mg total) by mouth daily before breakfast. (Patient not  taking: Reported on 12/17/2019) 30 tablet 1   No current facility-administered medications for this visit.    Family History Family History  Problem Relation Age of Onset  . COPD Mother   . Asthma Mother   . Atrial fibrillation Mother   . Heart disease Father   . Alzheimer's disease Maternal Grandmother   . Alzheimer's disease Maternal Aunt   . Breast cancer Neg Hx       Social History Social History   Tobacco Use  . Smoking status: Never Smoker  . Smokeless tobacco: Never Used  Substance Use Topics  . Alcohol use: No  . Drug use: No        Review of Systems  Constitutional: Negative.   HENT: Negative.   Eyes: Negative.   Respiratory: Negative.   Cardiovascular: Negative.   Gastrointestinal: Negative.   Genitourinary: Negative.   Musculoskeletal: Negative.   Skin: Negative.   Neurological: Negative.   Endo/Heme/Allergies: Negative.       Physical Exam Blood pressure (!) 160/83, pulse 61, temperature (!) 95.4 F (35.2 C), temperature source Temporal, height '5\' 9"'  (1.753 m), weight 200 lb 3.2 oz (90.8 kg), SpO2 98 %. Last Weight  Most recent update: 12/19/2019  1:37 PM   Weight  90.8 kg (200 lb 3.2 oz)            CONSTITUTIONAL: Well developed, and nourished, appropriately responsive and  aware without distress.   EYES: Sclera non-icteric.   EARS, NOSE, MOUTH AND THROAT: Mask worn.  Hearing is intact to voice.  NECK: Trachea is midline, and there is no jugular venous distension.  LYMPH NODES:  Lymph nodes in the neck are not enlarged. RESPIRATORY:  Lungs are clear, and breath sounds are equal bilaterally. Normal respiratory effort without pathologic use of accessory muscles. CARDIOVASCULAR: Heart is regular in rate and rhythm. GI: The abdomen is  soft, nontender, and nondistended.  GU: Breasts are symmetrical, there is a slight vague density in the upper outer quadrant of the right breast with yellowish discoloration from resolving ecchymosis.  No other  dominant nor suspicious lesions in either breast.  No palpable axillary lymphadenopathy.  No supraclavicular lymphadenopathy. MUSCULOSKELETAL:  Symmetrical muscle tone appreciated in all four extremities.    SKIN: Skin turgor is normal. No pathologic skin lesions appreciated.  NEUROLOGIC:  Motor and sensation appear grossly normal.  Cranial nerves are grossly without defect. PSYCH:  Alert and oriented to person, place and time. Affect is appropriate for situation.  Data Reviewed I have personally reviewed what is currently available of the patient's imaging, recent labs and medical records.   Labs:  CBC Latest Ref Rng & Units 10/04/2019 05/03/2018 11/14/2017  WBC 3.4 - 10.8 x10E3/uL 4.5 4.6 8.0  Hemoglobin 11.1 - 15.9 g/dL 14.5 14.5 13.5  Hematocrit 34.0 - 46.6 % 43.9 43.5 39.9  Platelets 150 - 450 x10E3/uL 234 214 225   CMP Latest Ref Rng & Units 10/04/2019 05/03/2018 11/14/2017  Glucose 65 - 99 mg/dL 96 89 83  BUN 6 - 24 mg/dL '20 14 14  ' Creatinine 0.57 - 1.00 mg/dL 0.90 1.05(H) 0.76  Sodium 134 - 144 mmol/L 142 141 141  Potassium 3.5 - 5.2 mmol/L 4.3 4.3 4.3  Chloride 96 - 106 mmol/L 106 102 104  CO2 20 - 29 mmol/L '23 23 20  ' Calcium 8.7 - 10.2 mg/dL 9.4 9.2 9.2  Total Protein 6.0 - 8.5 g/dL 6.9 6.7 6.3  Total Bilirubin 0.0 - 1.2 mg/dL 0.3 0.6 0.2  Alkaline Phos 39 - 117 IU/L 79 71 68  AST 0 - 40 IU/L '16 21 26  ' ALT 0 - 32 IU/L '10 12 13      ' Imaging: Radiology review: CLINICAL DATA:  54 year old female for further evaluation of possible RIGHT breast distortion of possible LEFT breast mass on screening mammogram.  EXAM: DIGITAL DIAGNOSTIC BILATERAL MAMMOGRAM WITH CAD AND TOMO  ULTRASOUND BILATERAL BREAST  COMPARISON:  Previous exam(s).  ACR Breast Density Category c: The breast tissue is heterogeneously dense, which may obscure small masses.  FINDINGS: 2D/3D full field and spot compression views of the RIGHT breast and spot compression views of the LEFT breast are  performed.  There is a persistent 0.6 cm irregular mass within the UPPER OUTER RIGHT breast on multiple spot compression views.  A persistent 1.3 cm circumscribed oval mass is identified within the UPPER-OUTER LEFT breast.  Mammographic images were processed with CAD.  Targeted ultrasound is performed, showing a 0.5 x 0.6 x 0.5 cm irregular hypoechoic shadowing mass at the 10:30 position of the RIGHT breast 8 cm from the nipple corresponding to the mammographic mass.  No abnormal RIGHT axillary lymph nodes are identified.  A 1 x 0.6 x 1.3 cm benign simple cyst at the 1:30 position of the LEFT breast 7 cm from the nipple is identified, corresponding to the LEFT breast screening study finding.  IMPRESSION: 1. 0.6 cm suspicious mass  within the UPPER-OUTER RIGHT breast. Tissue sampling is recommended. 2. No abnormal RIGHT axillary lymph nodes. 3. Benign cyst within the UPPER-OUTER LEFT breast, corresponding to the LEFT breast screening study finding.  RECOMMENDATION: Ultrasound-guided RIGHT breast biopsy, which will be arranged.  I have discussed the findings and recommendations with the patient. If applicable, a reminder letter will be sent to the patient regarding the next appointment.  BI-RADS CATEGORY  4: Suspicious.   Electronically Signed   By: Margarette Canada M.D.   On: 12/03/2019 14:36  Pathology: SURGICAL PATHOLOGY  CASE: [id number]  PATIENT: [facility]  Surgical Pathology Report      Specimen Submitted:  A. Breast, right 10:30   Clinical History: Small area of distortion on mammo without a definite  mass, concerning for CSL or malignancy. X-shaped clip placed following  stereotactic biopsy of RIGHT breast at 10:30.     DIAGNOSIS:  A. BREAST, RIGHT AT 10:30; STEREOTACTIC CORE NEEDLE BIOPSY:  - INVASIVE MAMMARY CARCINOMA, NO SPECIAL TYPE.   Size of invasive carcinoma: At least 10 mm in this sample  Histologic grade of invasive  carcinoma: Grade 1            Glandular/tubular differentiation score: 1            Nuclear pleomorphism score: 1            Mitotic rate score: 1            Total score: 3  Ductal carcinoma in situ: Present, low-grade  Lymphovascular invasion: Not identified   ER/PR/HER2: Immunohistochemistry will be performed on block A3, with  reflex to [place] for HER2 2+. The results will be reported in an addendum.   Within last 24 hrs: No results found.  Assessment    Invasive right breast carcinoma, minimum size 1 cm.  Node-negative on screening ultrasound. Patient Active Problem List   Diagnosis Date Noted  . Carcinoma of upper-outer quadrant of right female breast ([place]) 12/14/2019  . Encounter for general adult medical examination with abnormal findings 10/23/2019  . Mild intermittent asthma without complication 59/16/3846  . BMI 29.0-29.9,adult 10/23/2019  . Dysuria 10/23/2019  . Migraines 10/09/2018  . Body mass index (bmi) 31.0-31.9, adult 10/09/2018  . Acute non-recurrent pansinusitis 08/08/2018  . Need for prophylactic vaccination with combined diphtheria-tetanus-pertussis (DTaP) vaccine 06/18/2018  . Goiter diffuse, nontoxic 05/13/2018  . Menopausal and perimenopausal disorder 05/13/2018  . Vitamin D deficiency 05/13/2018  . Primary osteoarthritis of right shoulder 05/13/2018  . Obesity (BMI 30.0-34.9) 12/19/2017  . DDD (degenerative disc disease), cervical 12/19/2017  . Screening for breast cancer   . Benign neoplasm of ascending colon   . Rectal inflammation   . Acne 11/20/2012  . Bronchospasm 11/20/2012  . Family history of ischemic heart disease 11/20/2012  . Insomnia 11/20/2012    Plan    I discussed the available options with the patient. The risk of recurrence is similar between mastectomy and lumpectomy with radiation.  I also discussed that given the small size of the cancer would recommend localization lumpectomy with  radiation to follow.  I also discussed that we would need to do a sentinel lymph node biopsy to check the nodes.   Explained to the patient that after her surgical treatment additional treatment will depend on her prognostic indicators and stage.    I discussed risks of bleeding, infection, damage to surrounding tissues, having positive margins, needing further resection, damage to nerves causing arm numbness or difficulty raising arm, causing lymphedema  in the arm; as well as anesthesia risks of MI, stroke, prolonged ventilation, pulmonary embolism, thrombosis and even death.   Patient was given the opportunity to ask questions and have them answered.  They would like to proceed with right breast RFID localized lumpectomy with sentinel lymph node biopsy.   Face-to-face time spent with the patient and accompanying care providers(if present) was 30 minutes, with more than 50% of the time spent counseling, educating, and coordinating care of the patient.      Ronny Bacon M.D., FACS 12/19/2019, 2:26 PM

## 2019-12-19 NOTE — H&P (View-Only) (Signed)
Patient ID: Colleen Richard, female   DOB: [birth year], 54 y.o.   MRN: [id number]  Chief Complaint: Right breast cancer  History of Present Illness Colleen Richard is a 54 y.o. female with abnormal right breast screening imaging results again an ultrasound-guided core biopsy.  Prognostic indicators still pending.  Subcentimeter size of lesion.  Prior history of breast biopsies all benign in the past.  She has no family history of breast cancer.  2 pregnancies had her first child at the age of 41.  she denies any other breast symptoms, pain lump or skin changes.  Past Medical History Past Medical History:  Diagnosis Date  . Abnormal mammogram   . Asthma   . Menorrhagia   . Migraines   . Reflux esophagitis       Past Surgical History:  Procedure Laterality Date  . BREAST BIOPSY Right 08/26/2015   stereo, top hat clip   . BREAST BIOPSY Right 12/10/2019   affirm, x marker pending path   . BREAST EXCISIONAL BIOPSY Bilateral 2010   neg  . CATARACT EXTRACTION, BILATERAL  01/2013, 2015  . COLON SURGERY    . COLONOSCOPY WITH PROPOFOL N/A 11/03/2017   Procedure: COLONOSCOPY WITH PROPOFOL;  Surgeon: Virgel Manifold, MD;  Location: [facility] ENDOSCOPY;  Service: Endoscopy;  Laterality: N/A;  . EYE SURGERY    . 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 PARTIAL HYSTERECTOMY  2004  . TUBAL LIGATION      No Known Allergies  Current Outpatient Medications  Medication Sig Dispense Refill  . albuterol (VENTOLIN HFA) 108 (90 Base) MCG/ACT inhaler Inhale 2 puffs into the lungs every 6 (six) hours as needed for wheezing or shortness of breath. 18 g 3  . tretinoin (RETIN-A) 0.05 % cream Apply topically at bedtime. 45 g 0  . aspirin-acetaminophen-caffeine (EXCEDRIN MIGRAINE) 250-250-65 MG tablet Take 2 tablets by mouth every 6 (six) hours as needed for headache.    . phentermine (ADIPEX-P) 37.5 MG tablet Take 1 tablet (37.5 mg total) by mouth daily before breakfast. (Patient not  taking: Reported on 12/17/2019) 30 tablet 1   No current facility-administered medications for this visit.    Family History Family History  Problem Relation Age of Onset  . COPD Mother   . Asthma Mother   . Atrial fibrillation Mother   . Heart disease Father   . Alzheimer's disease Maternal Grandmother   . Alzheimer's disease Maternal Aunt   . Breast cancer Neg Hx       Social History Social History   Tobacco Use  . Smoking status: Never Smoker  . Smokeless tobacco: Never Used  Substance Use Topics  . Alcohol use: No  . Drug use: No        Review of Systems  Constitutional: Negative.   HENT: Negative.   Eyes: Negative.   Respiratory: Negative.   Cardiovascular: Negative.   Gastrointestinal: Negative.   Genitourinary: Negative.   Musculoskeletal: Negative.   Skin: Negative.   Neurological: Negative.   Endo/Heme/Allergies: Negative.       Physical Exam Blood pressure (!) 160/83, pulse 61, temperature (!) 95.4 F (35.2 C), temperature source Temporal, height '5\' 9"'  (1.753 m), weight 200 lb 3.2 oz (90.8 kg), SpO2 98 %. Last Weight  Most recent update: 12/19/2019  1:37 PM   Weight  90.8 kg (200 lb 3.2 oz)            CONSTITUTIONAL: Well developed, and nourished, appropriately responsive and  aware without distress.   EYES: Sclera non-icteric.   EARS, NOSE, MOUTH AND THROAT: Mask worn.  Hearing is intact to voice.  NECK: Trachea is midline, and there is no jugular venous distension.  LYMPH NODES:  Lymph nodes in the neck are not enlarged. RESPIRATORY:  Lungs are clear, and breath sounds are equal bilaterally. Normal respiratory effort without pathologic use of accessory muscles. CARDIOVASCULAR: Heart is regular in rate and rhythm. GI: The abdomen is  soft, nontender, and nondistended.  GU: Breasts are symmetrical, there is a slight vague density in the upper outer quadrant of the right breast with yellowish discoloration from resolving ecchymosis.  No other  dominant nor suspicious lesions in either breast.  No palpable axillary lymphadenopathy.  No supraclavicular lymphadenopathy. MUSCULOSKELETAL:  Symmetrical muscle tone appreciated in all four extremities.    SKIN: Skin turgor is normal. No pathologic skin lesions appreciated.  NEUROLOGIC:  Motor and sensation appear grossly normal.  Cranial nerves are grossly without defect. PSYCH:  Alert and oriented to person, place and time. Affect is appropriate for situation.  Data Reviewed I have personally reviewed what is currently available of the patient's imaging, recent labs and medical records.   Labs:  CBC Latest Ref Rng & Units 10/04/2019 05/03/2018 11/14/2017  WBC 3.4 - 10.8 x10E3/uL 4.5 4.6 8.0  Hemoglobin 11.1 - 15.9 g/dL 14.5 14.5 13.5  Hematocrit 34.0 - 46.6 % 43.9 43.5 39.9  Platelets 150 - 450 x10E3/uL 234 214 225   CMP Latest Ref Rng & Units 10/04/2019 05/03/2018 11/14/2017  Glucose 65 - 99 mg/dL 96 89 83  BUN 6 - 24 mg/dL '20 14 14  ' Creatinine 0.57 - 1.00 mg/dL 0.90 1.05(H) 0.76  Sodium 134 - 144 mmol/L 142 141 141  Potassium 3.5 - 5.2 mmol/L 4.3 4.3 4.3  Chloride 96 - 106 mmol/L 106 102 104  CO2 20 - 29 mmol/L '23 23 20  ' Calcium 8.7 - 10.2 mg/dL 9.4 9.2 9.2  Total Protein 6.0 - 8.5 g/dL 6.9 6.7 6.3  Total Bilirubin 0.0 - 1.2 mg/dL 0.3 0.6 0.2  Alkaline Phos 39 - 117 IU/L 79 71 68  AST 0 - 40 IU/L '16 21 26  ' ALT 0 - 32 IU/L '10 12 13      ' Imaging: Radiology review: CLINICAL DATA:  54 year old female for further evaluation of possible RIGHT breast distortion of possible LEFT breast mass on screening mammogram.  EXAM: DIGITAL DIAGNOSTIC BILATERAL MAMMOGRAM WITH CAD AND TOMO  ULTRASOUND BILATERAL BREAST  COMPARISON:  Previous exam(s).  ACR Breast Density Category c: The breast tissue is heterogeneously dense, which may obscure small masses.  FINDINGS: 2D/3D full field and spot compression views of the RIGHT breast and spot compression views of the LEFT breast are  performed.  There is a persistent 0.6 cm irregular mass within the UPPER OUTER RIGHT breast on multiple spot compression views.  A persistent 1.3 cm circumscribed oval mass is identified within the UPPER-OUTER LEFT breast.  Mammographic images were processed with CAD.  Targeted ultrasound is performed, showing a 0.5 x 0.6 x 0.5 cm irregular hypoechoic shadowing mass at the 10:30 position of the RIGHT breast 8 cm from the nipple corresponding to the mammographic mass.  No abnormal RIGHT axillary lymph nodes are identified.  A 1 x 0.6 x 1.3 cm benign simple cyst at the 1:30 position of the LEFT breast 7 cm from the nipple is identified, corresponding to the LEFT breast screening study finding.  IMPRESSION: 1. 0.6 cm suspicious mass  within the UPPER-OUTER RIGHT breast. Tissue sampling is recommended. 2. No abnormal RIGHT axillary lymph nodes. 3. Benign cyst within the UPPER-OUTER LEFT breast, corresponding to the LEFT breast screening study finding.  RECOMMENDATION: Ultrasound-guided RIGHT breast biopsy, which will be arranged.  I have discussed the findings and recommendations with the patient. If applicable, a reminder letter will be sent to the patient regarding the next appointment.  BI-RADS CATEGORY  4: Suspicious.   Electronically Signed   By: Margarette Canada M.D.   On: 12/03/2019 14:36  Pathology: SURGICAL PATHOLOGY  CASE: [id number]  PATIENT: [facility]  Surgical Pathology Report      Specimen Submitted:  A. Breast, right 10:30   Clinical History: Small area of distortion on mammo without a definite  mass, concerning for CSL or malignancy. X-shaped clip placed following  stereotactic biopsy of RIGHT breast at 10:30.     DIAGNOSIS:  A. BREAST, RIGHT AT 10:30; STEREOTACTIC CORE NEEDLE BIOPSY:  - INVASIVE MAMMARY CARCINOMA, NO SPECIAL TYPE.   Size of invasive carcinoma: At least 10 mm in this sample  Histologic grade of invasive  carcinoma: Grade 1            Glandular/tubular differentiation score: 1            Nuclear pleomorphism score: 1            Mitotic rate score: 1            Total score: 3  Ductal carcinoma in situ: Present, low-grade  Lymphovascular invasion: Not identified   ER/PR/HER2: Immunohistochemistry will be performed on block A3, with  reflex to [place] for HER2 2+. The results will be reported in an addendum.   Within last 24 hrs: No results found.  Assessment    Invasive right breast carcinoma, minimum size 1 cm.  Node-negative on screening ultrasound. Patient Active Problem List   Diagnosis Date Noted  . Carcinoma of upper-outer quadrant of right female breast ([place]) 12/14/2019  . Encounter for general adult medical examination with abnormal findings 10/23/2019  . Mild intermittent asthma without complication 44/11/4740  . BMI 29.0-29.9,adult 10/23/2019  . Dysuria 10/23/2019  . Migraines 10/09/2018  . Body mass index (bmi) 31.0-31.9, adult 10/09/2018  . Acute non-recurrent pansinusitis 08/08/2018  . Need for prophylactic vaccination with combined diphtheria-tetanus-pertussis (DTaP) vaccine 06/18/2018  . Goiter diffuse, nontoxic 05/13/2018  . Menopausal and perimenopausal disorder 05/13/2018  . Vitamin D deficiency 05/13/2018  . Primary osteoarthritis of right shoulder 05/13/2018  . Obesity (BMI 30.0-34.9) 12/19/2017  . DDD (degenerative disc disease), cervical 12/19/2017  . Screening for breast cancer   . Benign neoplasm of ascending colon   . Rectal inflammation   . Acne 11/20/2012  . Bronchospasm 11/20/2012  . Family history of ischemic heart disease 11/20/2012  . Insomnia 11/20/2012    Plan    I discussed the available options with the patient. The risk of recurrence is similar between mastectomy and lumpectomy with radiation.  I also discussed that given the small size of the cancer would recommend localization lumpectomy with  radiation to follow.  I also discussed that we would need to do a sentinel lymph node biopsy to check the nodes.   Explained to the patient that after her surgical treatment additional treatment will depend on her prognostic indicators and stage.    I discussed risks of bleeding, infection, damage to surrounding tissues, having positive margins, needing further resection, damage to nerves causing arm numbness or difficulty raising arm, causing lymphedema  in the arm; as well as anesthesia risks of MI, stroke, prolonged ventilation, pulmonary embolism, thrombosis and even death.   Patient was given the opportunity to ask questions and have them answered.  They would like to proceed with right breast RFID localized lumpectomy with sentinel lymph node biopsy.   Face-to-face time spent with the patient and accompanying care providers(if present) was 30 minutes, with more than 50% of the time spent counseling, educating, and coordinating care of the patient.      Ronny Bacon M.D., FACS 12/19/2019, 2:26 PM

## 2019-12-20 ENCOUNTER — Encounter
Admission: RE | Admit: 2019-12-20 | Discharge: 2019-12-20 | Disposition: A | Discharge status: Home / Self Care | Payer: Managed Care, Other (non HMO) | Source: Ambulatory Visit | Attending: Surgery | Admitting: Surgery

## 2019-12-20 ENCOUNTER — Telehealth: Payer: Self-pay | Admitting: Surgery

## 2019-12-20 ENCOUNTER — Other Ambulatory Visit: Payer: Self-pay

## 2019-12-20 ENCOUNTER — Ambulatory Visit
Admission: RE | Admit: 2019-12-20 | Discharge: 2019-12-20 | Disposition: A | Discharge status: Home / Self Care | Payer: Managed Care, Other (non HMO) | Source: Ambulatory Visit | Attending: Surgery | Admitting: Surgery

## 2019-12-20 DIAGNOSIS — C50011 Malignant neoplasm of nipple and areola, right female breast: Secondary | ICD-10-CM | POA: Insufficient documentation

## 2019-12-20 NOTE — Patient Instructions (Signed)
INSTRUCTIONS FOR SURGERY     Your surgery is scheduled for:   Wednesday, April 7TH   Please arrive in Nuclear Medicine at 7:30 am on the morning of your surgery.     REMEMBER: Instructions that are not followed completely may result in serious medical risk,  up to and including death, or upon the discretion of your surgeon and anesthesiologist,            your surgery may need to be rescheduled.  __X__ 1. Do not eat food after midnight the night before your procedure.                    No gum, candy, lozenger, tic tacs, tums or hard candies.                  ABSOLUTELY NOTHING SOLID IN YOUR MOUTH AFTER MIDNIGHT                    You may drink unlimited clear liquids up to 2 hours before you are scheduled to arrive for surgery.                   Do not drink anything within those 2 hours unless you need to take medicine, then take the                   smallest amount you need.  Clear liquids include:  water, apple juice without pulp,                   any flavor Gatorade, Black coffee, black tea.  Sugar may be added but no dairy/ honey /lemon.                        Broth and jello is not considered a clear liquid.  __x__  2. On the morning of surgery, please brush your teeth with toothpaste and water. You may rinse with                  mouthwash if you wish but DO NOT SWALLOW TOOTHPASTE OR MOUTHWASH  __X___3. NO alcohol for 24 hours before or after surgery.  __x___ 5. If you start any new medication after this appointment and prior to surgery, please                   Bring it with you on the day of surgery.  ___x__ 6. Notify your doctor if there is any change in your medical condition, such as fever, infection, vomitting,                   Diarrhea or any open sores.  __x___ 7.  USE the CHG SOAP as instructed, the night before surgery and the day of surgery.                   Once you have washed with this soap, do NOT use  any of the following: Powders, perfumes                    or lotions. Please do  not wear make up, hairpins, clips or nail polish. You MAY NOT wear deodorant.                   Men may shave their face and neck.  Women need to shave 48 hours prior to surgery.                   DO NOT wear ANY jewelry on the day of surgery. If there are rings that are too tight to                    remove easily, please address this prior to the surgery day. Piercings need to be removed.                                                                     NO METAL ON YOUR BODY.                    Do NOT bring any valuables.  If you came to Pre-Admit testing then you will not need license,                     insurance card or credit card.  If you will be staying overnight, please either leave your things in                     the car or have your family be responsible for these items.                     [facility] IS NOT RESPONSIBLE FOR BELONGINGS OR VALUABLES.  ___X__ 8. DO NOT wear contact lenses on surgery day.  You may not have dentures,                     Hearing aides, contacts or glasses in the operating room. These items can be                    Placed in the Recovery Room to receive immediately after surgery.  __x___ 9. IF YOU ARE SCHEDULED TO GO HOME ON THE SAME DAY, YOU MUST                   Have someone to drive you home and to stay with you  for the first 24 hours.                    Have an arrangement prior to arriving on surgery day.  ___x__ 10. Take the following medications on the morning of surgery with a sip of water:                              1.  VENTOLIN INHALER                     2.                     3.  _____ 11.  Follow any instructions provided to you by your surgeon.                        Such as enema, clear liquid bowel prep  __X__  12. STOP ALL ASPIRIN PRODUCTS AS OF TODAY, April 2ND                       THIS INCLUDES BC  POWDERS / GOODIES POWDER  __x___ 13. STOP Anti-inflammatories as of: TODAY, April 2ND                      This includes IBUPROFEN / MOTRIN / ADVIL / ALEVE/ NAPROXYN                    YOU MAY TAKE TYLENOL ANY TIME PRIOR TO SURGERY.  ______18.  Wear clean and comfortable clothing to the hospital.  BRING A SPORTS BRA OR SNUG WIRELESS BRA WITH YOU FOR SURGERY. THIS MAY REMAIN ON YOU AFTER SURGERY FOR A FEW DAYS. HAVE PHONE NUMBERS FOR YOUR CONTACT PEOPLE.

## 2019-12-20 NOTE — Telephone Encounter (Signed)
Pt has been advised of Pre-Admission date/time, COVID Testing date and Surgery date.  Surgery Date: 12/25/19 w/Nuc Med @ 8 am (arrival by 7:30 a) [place]:  12/20/19 @ 3:40 p Preadmission Testing Date: 12/20/19 (phone 8a-1p) Covid Testing Date: 12/23/19 - patient advised to go to the [facility] [place] [facility] between 8a-1p  Patient has been made aware to call [phone], between 1-3:00pm the day before surgery, to find out what time to arrive for surgery.

## 2019-12-23 ENCOUNTER — Other Ambulatory Visit
Admission: RE | Admit: 2019-12-23 | Discharge: 2019-12-23 | Disposition: A | Discharge status: Home / Self Care | Payer: Managed Care, Other (non HMO) | Source: Ambulatory Visit | Attending: Surgery | Admitting: Surgery

## 2019-12-23 ENCOUNTER — Other Ambulatory Visit: Payer: Self-pay

## 2019-12-23 DIAGNOSIS — Z01812 Encounter for preprocedural laboratory examination: Secondary | ICD-10-CM | POA: Diagnosis present

## 2019-12-23 DIAGNOSIS — Z20822 Contact with and (suspected) exposure to covid-19: Secondary | ICD-10-CM | POA: Insufficient documentation

## 2019-12-24 LAB — SURGICAL PATHOLOGY

## 2019-12-24 LAB — SARS CORONAVIRUS 2 (TAT 6-24 HRS): SARS Coronavirus 2: NEGATIVE

## 2019-12-25 ENCOUNTER — Encounter: Payer: Self-pay | Admitting: Surgery

## 2019-12-25 ENCOUNTER — Ambulatory Visit (HOSPITAL_BASED_OUTPATIENT_CLINIC_OR_DEPARTMENT_OTHER)
Admission: RE | Admit: 2019-12-25 | Discharge: 2019-12-25 | Disposition: A | Discharge status: Home / Self Care | Payer: Managed Care, Other (non HMO) | Source: Ambulatory Visit | Attending: Surgery | Admitting: Surgery

## 2019-12-25 ENCOUNTER — Ambulatory Visit
Admission: RE | Admit: 2019-12-25 | Discharge: 2019-12-25 | Disposition: A | Discharge status: Home / Self Care | Payer: Managed Care, Other (non HMO) | Source: Ambulatory Visit | Attending: Surgery | Admitting: Surgery

## 2019-12-25 ENCOUNTER — Ambulatory Visit: Payer: Managed Care, Other (non HMO) | Admitting: Anesthesiology

## 2019-12-25 ENCOUNTER — Encounter
Admission: RE | Disposition: A | Discharge status: Home / Self Care | Payer: Self-pay | Source: Home / Self Care | Attending: Surgery

## 2019-12-25 ENCOUNTER — Ambulatory Visit
Admission: RE | Admit: 2019-12-25 | Discharge: 2019-12-25 | Disposition: A | Discharge status: Home / Self Care | Payer: Managed Care, Other (non HMO) | Attending: Surgery | Admitting: Surgery

## 2019-12-25 ENCOUNTER — Other Ambulatory Visit: Payer: Self-pay

## 2019-12-25 DIAGNOSIS — C50011 Malignant neoplasm of nipple and areola, right female breast: Secondary | ICD-10-CM | POA: Insufficient documentation

## 2019-12-25 DIAGNOSIS — Z6828 Body mass index (BMI) 28.0-28.9, adult: Secondary | ICD-10-CM | POA: Diagnosis not present

## 2019-12-25 DIAGNOSIS — Z79899 Other long term (current) drug therapy: Secondary | ICD-10-CM | POA: Diagnosis not present

## 2019-12-25 DIAGNOSIS — G43909 Migraine, unspecified, not intractable, without status migrainosus: Secondary | ICD-10-CM | POA: Insufficient documentation

## 2019-12-25 DIAGNOSIS — E669 Obesity, unspecified: Secondary | ICD-10-CM | POA: Diagnosis not present

## 2019-12-25 DIAGNOSIS — C50411 Malignant neoplasm of upper-outer quadrant of right female breast: Secondary | ICD-10-CM | POA: Diagnosis not present

## 2019-12-25 HISTORY — PX: BREAST LUMPECTOMY WITH RADIOFREQUENCY TAG IDENTIFICATION: SHX6884

## 2019-12-25 HISTORY — PX: SENTINEL NODE BIOPSY: SHX6608

## 2019-12-25 HISTORY — PX: BREAST LUMPECTOMY: SHX2

## 2019-12-25 SURGERY — BREAST LUMPECTOMY WITH RADIOFREQUENCY TAG IDENTIFICATION
Anesthesia: General | Laterality: Right

## 2019-12-25 MED ORDER — PROPOFOL 10 MG/ML IV BOLUS
INTRAVENOUS | Status: DC | PRN
  Administered 2019-12-25: 180 mg via INTRAVENOUS

## 2019-12-25 MED ORDER — MIDAZOLAM HCL 2 MG/2ML IJ SOLN
INTRAMUSCULAR | Status: AC
  Filled 2019-12-25: qty 2

## 2019-12-25 MED ORDER — LIDOCAINE HCL (CARDIAC) PF 100 MG/5ML IV SOSY
PREFILLED_SYRINGE | INTRAVENOUS | Status: DC | PRN
  Administered 2019-12-25: 100 mg via INTRAVENOUS

## 2019-12-25 MED ORDER — ONDANSETRON HCL 4 MG/2ML IJ SOLN
INTRAMUSCULAR | Status: DC | PRN
  Administered 2019-12-25: 4 mg via INTRAVENOUS

## 2019-12-25 MED ORDER — CHLORHEXIDINE GLUCONATE CLOTH 2 % EX PADS
6.0000 | MEDICATED_PAD | Freq: Once | CUTANEOUS | Status: AC
  Administered 2019-12-25: 6 via TOPICAL

## 2019-12-25 MED ORDER — METHYLENE BLUE 0.5 % INJ SOLN
INTRAVENOUS | Status: AC
  Filled 2019-12-25: qty 10

## 2019-12-25 MED ORDER — TECHNETIUM TC 99M SULFUR COLLOID FILTERED
0.6820 | Freq: Once | INTRAVENOUS | Status: AC | PRN
  Administered 2019-12-25: 08:00:00 0.682 via INTRADERMAL

## 2019-12-25 MED ORDER — ISOSULFAN BLUE 1 % ~~LOC~~ SOLN
SUBCUTANEOUS | Status: AC
  Filled 2019-12-25: qty 5

## 2019-12-25 MED ORDER — ACETAMINOPHEN 500 MG PO TABS
ORAL_TABLET | ORAL | Status: AC
  Administered 2019-12-25: 1000 mg via ORAL
  Filled 2019-12-25: qty 2

## 2019-12-25 MED ORDER — FENTANYL CITRATE (PF) 100 MCG/2ML IJ SOLN
INTRAMUSCULAR | Status: DC | PRN
  Administered 2019-12-25 (×2): 50 ug via INTRAVENOUS

## 2019-12-25 MED ORDER — GABAPENTIN 300 MG PO CAPS
ORAL_CAPSULE | ORAL | Status: AC
  Administered 2019-12-25: 300 mg via ORAL
  Filled 2019-12-25: qty 1

## 2019-12-25 MED ORDER — FENTANYL CITRATE (PF) 100 MCG/2ML IJ SOLN
INTRAMUSCULAR | Status: AC
  Filled 2019-12-25: qty 2

## 2019-12-25 MED ORDER — LACTATED RINGERS IV SOLN: INTRAVENOUS | Status: DC | PRN

## 2019-12-25 MED ORDER — BUPIVACAINE-EPINEPHRINE 0.25% -1:200000 IJ SOLN
INTRAMUSCULAR | Status: DC | PRN
  Administered 2019-12-25: 30 mL

## 2019-12-25 MED ORDER — GABAPENTIN 300 MG PO CAPS: 300.0000 mg | ORAL_CAPSULE | ORAL | Status: AC

## 2019-12-25 MED ORDER — IBUPROFEN 800 MG PO TABS: 800.0000 mg | ORAL_TABLET | Freq: Three times a day (TID) | ORAL | 0 refills | Status: DC | PRN

## 2019-12-25 MED ORDER — BUPIVACAINE LIPOSOME 1.3 % IJ SUSP: 20.0000 mL | Freq: Once | INTRAMUSCULAR | Status: DC

## 2019-12-25 MED ORDER — ISOSULFAN BLUE 1 % ~~LOC~~ SOLN
SUBCUTANEOUS | Status: DC | PRN
  Administered 2019-12-25: 4 mL via SUBCUTANEOUS

## 2019-12-25 MED ORDER — FAMOTIDINE 20 MG PO TABS
ORAL_TABLET | ORAL | Status: AC
  Administered 2019-12-25: 20 mg via ORAL
  Filled 2019-12-25: qty 1

## 2019-12-25 MED ORDER — BUPIVACAINE-EPINEPHRINE (PF) 0.25% -1:200000 IJ SOLN
INTRAMUSCULAR | Status: AC
  Filled 2019-12-25: qty 30

## 2019-12-25 MED ORDER — CEFAZOLIN SODIUM-DEXTROSE 2-4 GM/100ML-% IV SOLN
2.0000 g | INTRAVENOUS | Status: AC
  Administered 2019-12-25: 2 g via INTRAVENOUS

## 2019-12-25 MED ORDER — HYDROCODONE-ACETAMINOPHEN 5-325 MG PO TABS: 1.0000 | ORAL_TABLET | Freq: Four times a day (QID) | ORAL | 0 refills | Status: DC | PRN

## 2019-12-25 MED ORDER — DEXMEDETOMIDINE HCL 200 MCG/2ML IV SOLN
INTRAVENOUS | Status: DC | PRN
  Administered 2019-12-25: 16 ug via INTRAVENOUS

## 2019-12-25 MED ORDER — LIDOCAINE HCL 1 % IJ SOLN: INTRAMUSCULAR | Status: DC | PRN

## 2019-12-25 MED ORDER — ACETAMINOPHEN 500 MG PO TABS: 1000.0000 mg | ORAL_TABLET | ORAL | Status: AC

## 2019-12-25 MED ORDER — PHENYLEPHRINE HCL (PRESSORS) 10 MG/ML IV SOLN
INTRAVENOUS | Status: DC | PRN
  Administered 2019-12-25 (×9): 100 ug via INTRAVENOUS

## 2019-12-25 MED ORDER — DEXAMETHASONE SODIUM PHOSPHATE 10 MG/ML IJ SOLN
INTRAMUSCULAR | Status: DC | PRN
  Administered 2019-12-25: 10 mg via INTRAVENOUS

## 2019-12-25 MED ORDER — CEFAZOLIN SODIUM-DEXTROSE 2-4 GM/100ML-% IV SOLN
INTRAVENOUS | Status: AC
  Filled 2019-12-25: qty 100

## 2019-12-25 MED ORDER — LACTATED RINGERS IV SOLN: INTRAVENOUS | Status: DC

## 2019-12-25 MED ORDER — FAMOTIDINE 20 MG PO TABS: 20.0000 mg | ORAL_TABLET | Freq: Once | ORAL | Status: AC

## 2019-12-25 SURGICAL SUPPLY — 46 items
BLADE SURG 15 STRL LF DISP TIS (BLADE) ×2 IMPLANT
BLADE SURG 15 STRL SS (BLADE) ×2
BULB RESERV EVAC DRAIN JP 100C (MISCELLANEOUS) IMPLANT
CANISTER SUCT 1200ML W/VALVE (MISCELLANEOUS) ×2 IMPLANT
CHLORAPREP W/TINT 26 (MISCELLANEOUS) ×2 IMPLANT
CLIP VESOCCLUDE SM WIDE 6/CT (CLIP) ×1 IMPLANT
CNTNR SPEC 2.5X3XGRAD LEK (MISCELLANEOUS) ×1
CONT SPEC 4OZ STER OR WHT (MISCELLANEOUS) ×1
CONTAINER SPEC 2.5X3XGRAD LEK (MISCELLANEOUS) ×1 IMPLANT
COVER WAND RF STERILE (DRAPES) ×2 IMPLANT
DECANTER SPIKE VIAL GLASS SM (MISCELLANEOUS) ×2 IMPLANT
DERMABOND ADVANCED (GAUZE/BANDAGES/DRESSINGS) ×2
DERMABOND ADVANCED .7 DNX12 (GAUZE/BANDAGES/DRESSINGS) ×1 IMPLANT
DEVICE DUBIN SPECIMEN MAMMOGRA (MISCELLANEOUS) ×2 IMPLANT
DRAIN CHANNEL JP 15F RND 16 (MISCELLANEOUS) IMPLANT
DRAPE LAPAROTOMY TRNSV 106X77 (MISCELLANEOUS) ×2 IMPLANT
ELECT CAUTERY BLADE TIP 2.5 (TIP) ×2
ELECT REM PT RETURN 9FT ADLT (ELECTROSURGICAL) ×2
ELECTRODE CAUTERY BLDE TIP 2.5 (TIP) ×1 IMPLANT
ELECTRODE REM PT RTRN 9FT ADLT (ELECTROSURGICAL) ×1 IMPLANT
GLOVE ORTHO TXT STRL SZ7.5 (GLOVE) ×6 IMPLANT
GOWN STRL REUS W/ TWL LRG LVL3 (GOWN DISPOSABLE) ×2 IMPLANT
GOWN STRL REUS W/TWL LRG LVL3 (GOWN DISPOSABLE) ×2
KIT MARKER MARGIN INK (KITS) ×1 IMPLANT
KIT TURNOVER KIT A (KITS) ×2 IMPLANT
MARKER MARGIN CORRECT CLIP (MARKER) IMPLANT
NDL FILTER BLUNT 18X1 1/2 (NEEDLE) ×1 IMPLANT
NEEDLE FILTER BLUNT 18X 1/2SAF (NEEDLE) ×1
NEEDLE FILTER BLUNT 18X1 1/2 (NEEDLE) ×1 IMPLANT
NEEDLE HYPO 22GX1.5 SAFETY (NEEDLE) ×2 IMPLANT
PACK BASIN MINOR ARMC (MISCELLANEOUS) ×2 IMPLANT
SET LOCALIZER 20 PROBE US (MISCELLANEOUS) ×1 IMPLANT
SHEARS HARMONIC 9CM CVD (BLADE) IMPLANT
SLEVE PROBE SENORX GAMMA FIND (MISCELLANEOUS) ×2 IMPLANT
SPONGE KITTNER 5P (MISCELLANEOUS) IMPLANT
SUT MNCRL 4-0 (SUTURE) ×1
SUT MNCRL 4-0 27XMFL (SUTURE) ×1
SUT SILK 2 0 (SUTURE) ×1
SUT SILK 2-0 18XBRD TIE 12 (SUTURE) ×1 IMPLANT
SUT VIC AB 3-0 54X BRD REEL (SUTURE) ×1 IMPLANT
SUT VIC AB 3-0 BRD 54 (SUTURE) ×1
SUT VIC AB 3-0 SH 27 (SUTURE) ×1
SUT VIC AB 3-0 SH 27X BRD (SUTURE) ×1 IMPLANT
SUTURE MNCRL 4-0 27XMF (SUTURE) ×1 IMPLANT
SYR 10ML LL (SYRINGE) ×4 IMPLANT
WATER STERILE IRR 1000ML POUR (IV SOLUTION) ×2 IMPLANT

## 2019-12-25 NOTE — Anesthesia Postprocedure Evaluation (Signed)
Anesthesia Post Note  Patient: Colleen Richard  Procedure(s) Performed: BREAST LUMPECTOMY WITH RADIOFREQUENCY TAG IDENTIFICATION (Right ) SENTINEL NODE BIOPSY (Right )  Patient location during evaluation: PACU Anesthesia Type: General Level of consciousness: awake and alert and oriented Pain management: pain level controlled Vital Signs Assessment: post-procedure vital signs reviewed and stable Respiratory status: spontaneous breathing, nonlabored ventilation and respiratory function stable Cardiovascular status: blood pressure returned to baseline and stable Postop Assessment: no signs of nausea or vomiting Anesthetic complications: no     Last Vitals:  Vitals:   12/25/19 1308 12/25/19 1324  BP: 117/79 136/77  Pulse: (!) 53 62  Resp: 20 18  Temp: (!) 36.1 C 36.4 C  SpO2: 100% 100%    Last Pain:  Vitals:   12/25/19 1324  TempSrc: Temporal  PainSc: 0-No pain                 Tuck Dulworth

## 2019-12-25 NOTE — Discharge Instructions (Signed)

## 2019-12-25 NOTE — Anesthesia Preprocedure Evaluation (Signed)
Anesthesia Evaluation  Patient identified by MRN, date of birth, ID band Patient awake    Reviewed: Allergy & Precautions, H&P , NPO status , Patient's Chart, lab work & pertinent test results  History of Anesthesia Complications Negative for: history of anesthetic complications  Airway Mallampati: II  TM Distance: >3 FB Neck ROM: full    Dental  (+) Chipped   Pulmonary asthma ,           Cardiovascular Exercise Tolerance: Good (-) angina(-) Past MI and (-) DOE negative cardio ROS       Neuro/Psych  Headaches, negative psych ROS   GI/Hepatic negative GI ROS, Neg liver ROS, neg GERD  ,  Endo/Other  negative endocrine ROS  Renal/GU      Musculoskeletal  (+) Arthritis ,   Abdominal   Peds  Hematology negative hematology ROS (+)   Anesthesia Other Findings Past Medical History: No date: Abnormal mammogram No date: Asthma 12/2019: Cancer ([place])     Comment:  breast cancer on right No date: Menorrhagia No date: Migraines 12/2019: Multiple thyroid nodules     Comment:  biopsied in past. now just following with yearly               ultrasounds No date: Reflux esophagitis  Past Surgical History: 2004: ABDOMINAL HYSTERECTOMY 08/26/2015: BREAST BIOPSY; Right     Comment:  stereo, top hat clip  12/10/2019: BREAST BIOPSY; Right     Comment:  affirm, x marker pending path  2010: BREAST EXCISIONAL BIOPSY; Bilateral     Comment:  neg 01/2013, 2015: CATARACT EXTRACTION, BILATERAL 11/03/2017: COLONOSCOPY WITH PROPOFOL; N/A     Comment:  Procedure: COLONOSCOPY WITH PROPOFOL;  Surgeon:               Virgel Manifold, MD;  Location: [facility] ENDOSCOPY;                Service: Endoscopy;  Laterality: N/A; 2018: EYE SURGERY     Comment:  cataract and retinal detachment 05/31/2017: hole in macular and detached retina right eye 2004: PARTIAL HYSTERECTOMY No date: TUBAL LIGATION  BMI    Body Mass Index: 28.98 kg/m       Reproductive/Obstetrics negative OB ROS                             Anesthesia Physical Anesthesia Plan  ASA: III  Anesthesia Plan: General LMA   Post-op Pain Management:    Induction: Intravenous  PONV Risk Score and Plan: Dexamethasone, Ondansetron, Midazolam and Treatment may vary due to age or medical condition  Airway Management Planned: LMA  Additional Equipment:   Intra-op Plan:   Post-operative Plan: Extubation in OR  Informed Consent: I have reviewed the patients History and Physical, chart, labs and discussed the procedure including the risks, benefits and alternatives for the proposed anesthesia with the patient or authorized representative who has indicated his/her understanding and acceptance.     Dental Advisory Given  Plan Discussed with: Anesthesiologist, CRNA and Surgeon  Anesthesia Plan Comments: (Patient consented for risks of anesthesia including but not limited to:  - adverse reactions to medications - damage to teeth, lips or other oral mucosa - sore throat or hoarseness - Damage to heart, brain, lungs or loss of life  Patient voiced understanding.)        Anesthesia Quick Evaluation

## 2019-12-25 NOTE — Interval H&P Note (Signed)
History and Physical Interval Note:  12/25/2019 10:11 AM  Colleen Richard  has presented today for surgery, with the diagnosis of Right breast cancer.  The various methods of treatment have been discussed with the patient and family. After consideration of risks, benefits and other options for treatment, the patient has consented to  Procedure(s): BREAST LUMPECTOMY WITH RADIOFREQUENCY TAG IDENTIFICATION (Right) SENTINEL NODE BIOPSY (Right) as a surgical intervention.  The patient's history has been reviewed, patient examined, no change in status, stable for surgery.  I have reviewed the patient's chart and labs.  Questions were answered to the patient's satisfaction.   Right breast is marked for 6 mm lesion, ER+ PR+ Her-2 negative.  RFID placement images reviewed.    Ronny Bacon

## 2019-12-25 NOTE — Transfer of Care (Signed)
Immediate Anesthesia Transfer of Care Note  Patient: Colleen Richard  Procedure(s) Performed: BREAST LUMPECTOMY WITH RADIOFREQUENCY TAG IDENTIFICATION (Right ) SENTINEL NODE BIOPSY (Right )  Patient Location: PACU  Anesthesia Type:General  Level of Consciousness: sedated  Airway & Oxygen Therapy: Patient Spontanous Breathing and Patient connected to face mask oxygen  Post-op Assessment: Report given to RN and Post -op Vital signs reviewed and stable  Post vital signs: Reviewed and stable  Last Vitals:  Vitals Value Taken Time  BP 105/64 12/25/19 1223  Temp 36.2 C 12/25/19 1223  Pulse 68 12/25/19 1232  Resp 13 12/25/19 1232  SpO2 100 % 12/25/19 1232  Vitals shown include unvalidated device data.  Last Pain:  Vitals:   12/25/19 1231  TempSrc:   PainSc: 0-No pain         Complications: No apparent anesthesia complications

## 2019-12-25 NOTE — Op Note (Signed)
  Pre-operative Diagnosis: Breast Cancer: right upper outer quadrant    Post-operative Diagnosis: Same   Surgeon: Ronny Bacon, M.D., [facility]  Anesthesia: General  Procedure: Right lumpectomy, RFID tag directed, sentinel node biopsy  Procedure Details  The patient was seen again in the Holding Room. The benefits, complications, treatment options, and expected outcomes were discussed with the patient. The risks of bleeding, infection, recurrence of symptoms, failure to resolve symptoms, hematoma, seroma, open wound, cosmetic deformity, and the need for further surgery were discussed.  The patient was taken to Operating Room, identified as Colleen Richard and the procedure verified.  A Time Out was held and the above information confirmed.  Prior to the induction of general anesthesia, antibiotic prophylaxis was administered. VTE prophylaxis was in place. The LOCALizer is used to mark the skin for incision.  A visual dye Isosulfan Blue 4 ml was injected periareolar dermis early under aseptic conditions. Appropriate anesthesia was then administered and tolerated well. The chest was prepped with Chloraprep and draped in the sterile fashion. The patient was positioned in the supine position.  Then using the hand-held probe an area of high counts was identified in the axilla, an incision was made and direction by the probe aided in dissection of a lymph node which was sent for permanent section.  2 lymph nodes were obtained, #1 was blue with activity, #2 had more activity with minimal blue staining, if any.  Attention was turned to the RFID tag localization site where an incision was made. Dissection using the LOCALizer to perform a lumpectomy with adequate margins was performed. This was done with electrocautery and sharp dissection with Mayo scissors. There was minimal bleeding, and the cavity packed.  The specimen was taken to the back table and painted to demarcate the 6 surfaces of potential margin.   Unfortunately I approached the RF ID tag and dislodged it.  I could identify where it was located on the specimen, and realize that I needed more specimen medially.  Therefore the second specimen is only identified along the medial margins or as the nonpainted surfaces will be the area of division of the 2 specimens.   Both specimens were placed in the Faxitron confirming the not dislodged biopsy marker to be present within the specimen along with the RF ID device which was dislodged.  I returned to the cavity to remove the packing, and hemostasis was confirmed with electrocautery.   Once assuring that hemostasis was adequate and checked multiple times the wound was closed with interrupted 3-0 Vicryl followed by 4-0 subcuticular Monocryl sutures.  The axillary wound was closed in a similar fashion. Dermabond is utilized to seal the incision.   Patient was taken to the recovery room in stable condition where a postoperative chest film has been ordered.   Findings: Faxitron imaging: Confirms both markers present.  RF ID tag is mobile and was identified in the transected specimen.  Additional medial margin is taken to complete the excision of the lesion.  Estimated Blood Loss: Minimal         Drains: None         Specimens: Breast specimen in 2 pieces appropriately labeled for margins.  2 sentinel lymph nodes.       Complications: None                  Condition: Stable   Ardith Dark. Christian Mate, M.D., FACS

## 2019-12-25 NOTE — Anesthesia Procedure Notes (Signed)
Procedure Name: LMA Insertion Date/Time: 12/25/2019 10:47 AM Performed by: Justus Memory, CRNA Pre-anesthesia Checklist: Patient identified, Patient being monitored, Timeout performed, Emergency Drugs available and Suction available Patient Re-evaluated:Patient Re-evaluated prior to induction Oxygen Delivery Method: Circle system utilized Preoxygenation: Pre-oxygenation with 100% oxygen Induction Type: IV induction Ventilation: Mask ventilation without difficulty LMA: LMA inserted LMA Size: 3.5 Tube type: Oral Number of attempts: 1 Placement Confirmation: positive ETCO2 and breath sounds checked- equal and bilateral Tube secured with: Tape Dental Injury: Teeth and Oropharynx as per pre-operative assessment

## 2019-12-27 NOTE — Progress Notes (Signed)
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  INTERVAL HISTORY: Patient returns to clinic today for further evaluation, discussion of her final pathology results, and treatment planning.  She tolerated her lumpectomy well without significant side effects.  She currently feels well and is asymptomatic. She has no neurologic complaints.  She denies any recent fevers or illnesses.  She has a good appetite and denies weight loss.  She has no chest pain, shortness of breath, cough, or hemoptysis.  She denies any nausea, vomiting, constipation, or diarrhea.  She has no urinary complaints.  Patient offers no specific complaints today.    REVIEW OF SYSTEMS:   Review of Systems  Constitutional: Negative.  Negative for fever, malaise/fatigue and weight loss.  Respiratory: Negative.  Negative for cough, hemoptysis and shortness of breath.   Cardiovascular: Negative.  Negative for chest pain and leg swelling.  Gastrointestinal: Negative.  Negative for abdominal pain.  Genitourinary: Negative.  Negative for dysuria.  Musculoskeletal: Negative.  Negative for back pain.  Skin: Negative.  Negative for rash.  Neurological: Negative.  Negative for dizziness, focal weakness, weakness and headaches.  Psychiatric/Behavioral: Negative.  The patient is not nervous/anxious.     As per HPI. Otherwise, a complete review of systems is negative.  PAST MEDICAL HISTORY: Past Medical History:  Diagnosis Date  . Abnormal mammogram   . Asthma   . Cancer ([place]) 12/2019   breast cancer on right  .  Menorrhagia   . Migraines   . Multiple thyroid nodules 12/2019   biopsied in past. now just following with yearly ultrasounds  . Reflux esophagitis     PAST SURGICAL HISTORY: Past Surgical History:  Procedure Laterality Date  . ABDOMINAL HYSTERECTOMY  2004  . BREAST BIOPSY Right 08/26/2015   stereo, top hat clip   . BREAST BIOPSY Right 12/10/2019   affirm, x marker pending path   . BREAST EXCISIONAL BIOPSY Bilateral 2010   neg  . BREAST LUMPECTOMY WITH RADIOFREQUENCY TAG IDENTIFICATION Right 12/25/2019   Procedure: BREAST LUMPECTOMY WITH RADIOFREQUENCY TAG IDENTIFICATION;  Surgeon: Ronny Bacon, MD;  Location: [facility] ORS;  Service: General;  Laterality: Right;  . CATARACT EXTRACTION, BILATERAL  01/2013, 2015  . COLONOSCOPY WITH PROPOFOL N/A 11/03/2017   Procedure: COLONOSCOPY WITH PROPOFOL;  Surgeon: Virgel Manifold, MD;  Location: [facility] ENDOSCOPY;  Service: Endoscopy;  Laterality: N/A;  . EYE SURGERY  2018   cataract and retinal detachment  . 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 PARTIAL HYSTERECTOMY  2004  . SENTINEL NODE BIOPSY Right 12/25/2019   Procedure: SENTINEL NODE BIOPSY;  Surgeon: Ronny Bacon, MD;  Location: [facility] ORS;  Service: General;  Laterality: Right;  . TUBAL LIGATION      FAMILY HISTORY: Family History  Problem Relation Age of Onset  . COPD Mother   . Asthma Mother   . Atrial fibrillation Mother   . Heart disease Father   . Alzheimer's disease Maternal Grandmother   . Alzheimer's disease Maternal Aunt   . Breast cancer Neg Hx     ADVANCED DIRECTIVES (Y/N):  N  HEALTH MAINTENANCE: Social History  Tobacco Use  . Smoking status: Never Smoker  . Smokeless tobacco: Never Used  Substance Use Topics  . Alcohol use: No  . Drug use: No     Colonoscopy:  PAP:  Bone density:  Lipid panel:  No Known Allergies  Current Outpatient Medications  Medication Sig Dispense Refill  . albuterol (VENTOLIN HFA) 108 (90 Base)  MCG/ACT inhaler Inhale 2 puffs into the lungs every 6 (six) hours as needed for wheezing or shortness of breath. 18 g 3  . aspirin-acetaminophen-caffeine (EXCEDRIN MIGRAINE) 034-917-91 MG tablet Take 2 tablets by mouth every 6 (six) hours as needed for headache.    . ibuprofen (ADVIL) 800 MG tablet Take 1 tablet (800 mg total) by mouth every 8 (eight) hours as needed. 30 tablet 0  . HYDROcodone-acetaminophen (NORCO/VICODIN) 5-325 MG tablet Take 1 tablet by mouth every 6 (six) hours as needed for moderate pain. (Patient not taking: Reported on 01/02/2020) 15 tablet 0  . phentermine (ADIPEX-P) 37.5 MG tablet Take 1 tablet (37.5 mg total) by mouth daily before breakfast. (Patient not taking: Reported on 01/02/2020) 30 tablet 1  . tretinoin (RETIN-A) 0.05 % cream Apply topically at bedtime. (Patient not taking: Reported on 01/02/2020) 45 g 0   No current facility-administered medications for this visit.    OBJECTIVE: Vitals:   01/03/20 1114  BP: (!) 135/91  Pulse: 71  Resp: 18  Temp: (!) 97.3 F (36.3 C)  SpO2: 100%     Body mass index is 29.93 kg/m.    ECOG FS:0 - Asymptomatic  General: Well-developed, well-nourished, no acute distress. Eyes: Pink conjunctiva, anicteric sclera. HEENT: Normocephalic, moist mucous membranes. Breast: Right breast with well-healing surgical scar.  Exam deferred today. Lungs: No audible wheezing or coughing. Heart: Regular rate and rhythm. Abdomen: Soft, nontender, no obvious distention. Musculoskeletal: No edema, cyanosis, or clubbing. Neuro: Alert, answering all questions appropriately. Cranial nerves grossly intact. Skin: No rashes or petechiae noted. Psych: Normal affect.   LAB RESULTS:  Lab Results  Component Value Date   NA 142 10/04/2019   K 4.3 10/04/2019   CL 106 10/04/2019   CO2 23 10/04/2019   GLUCOSE 96 10/04/2019   BUN 20 10/04/2019   CREATININE 0.90 10/04/2019   CALCIUM 9.4 10/04/2019   PROT 6.9 10/04/2019   ALBUMIN 4.7 10/04/2019     AST 16 10/04/2019   ALT 10 10/04/2019   ALKPHOS 79 10/04/2019   BILITOT 0.3 10/04/2019   GFRNONAA 74 10/04/2019   GFRAA 85 10/04/2019    Lab Results  Component Value Date   WBC 4.5 10/04/2019   NEUTROABS 2.4 10/04/2019   HGB 14.5 10/04/2019   HCT 43.9 10/04/2019   MCV 88 10/04/2019   PLT 234 10/04/2019     STUDIES: NM SENTINEL NODE INJECTION  Result Date: 12/25/2019 CLINICAL DATA:  Right-sided breast cancer. EXAM: NUCLEAR MEDICINE BREAST LYMPHOSCINTIGRAPHY TECHNIQUE: Intradermal injection of radiopharmaceutical was performed at the 12 o'clock, 3 o'clock, 6 o'clock, and 9 o'clock positions around the right nipple. The patient was then sent to the operating room where the sentinel node(s) were identified and removed by the surgeon. RADIOPHARMACEUTICALS:  Total of 1 mCi Millipore-filtered Technetium-69msulfur colloid, injected in four aliquots of 0.25 mCi each. IMPRESSION: Uncomplicated intradermal injection of a total of 1 mCi Technetium-969mulfur colloid for purposes of sentinel node identification. Electronically Signed   By: JoSandi Mariscal.D.   On: 12/25/2019 08:31   MM Breast Surgical Specimen  Result Date: 12/25/2019 CLINICAL DATA:  Status post  RF llocalized right breast lumpectomy. EXAM: SPECIMEN RADIOGRAPH OF THE right BREAST COMPARISON:  Previous exam(s). FINDINGS: Status post excision of the right breast. RF tag and X shaped clip are present within the specimen. IMPRESSION: Specimen radiograph of the right breast. Electronically Signed   By: Lillia Mountain M.D.   On: 12/25/2019 11:56   MM CLIP PLACEMENT RIGHT  Result Date: 12/10/2019 CLINICAL DATA:  54 year old female presenting for biopsy of right breast distortion. EXAM: DIAGNOSTIC RIGHT MAMMOGRAM POST STEREOTACTIC BIOPSY COMPARISON:  Previous exam(s). FINDINGS: Mammographic images were obtained following stereotactic guided biopsy of distortion in the upper-outer quadrant of the right breast at 10:30 o'clock. The X biopsy  marking clip is in expected position at the site of biopsy. IMPRESSION: Appropriate positioning of the X shaped biopsy marking clip at the site of biopsy in the upper outer right breast at 10:30 o'clock. Final Assessment: Post Procedure Mammograms for Marker Placement Electronically Signed   By: Audie Pinto M.D.   On: 12/10/2019 11:36   MM RT BREAST BX W LOC DEV 1ST LESION IMAGE BX SPEC STEREO GUIDE  Addendum Date: 12/12/2019   ADDENDUM REPORT: 12/12/2019 15:30 ADDENDUM: PATHOLOGY revealed: A. BREAST, RIGHT AT 10:30; STEREOTACTIC CORE NEEDLE BIOPSY: - INVASIVE MAMMARY CARCINOMA, NO SPECIAL TYPE. At least 10 mm in this sample. Grade 1. Ductal carcinoma in situ: Present, low-grade. Lymphovascular invasion: Not identified. Pathology results are CONCORDANT with imaging findings, per Dr. Audie Pinto. Pathology results and recommendations below were discussed with patient by telephone on 12/12/2019. Patient reported biopsy site doing well with slight tenderness at the site. Post biopsy care instructions were reviewed and questions were answered. Patient was instructed to call [facility] if any concerns or questions arise related to the biopsy. Recommendation: Surgical referral. Request for surgical referral was relayed to [place] and Tanya Nones RN at [facility] by Electa Sniff RN on 12/12/2019. Addendum by Electa Sniff RN on 12/12/2019. Electronically Signed   By: Audie Pinto M.D.   On: 12/12/2019 15:30   Result Date: 12/12/2019 CLINICAL DATA:  54 year old female presenting for biopsy of right breast distortion. EXAM: RIGHT BREAST STEREOTACTIC CORE NEEDLE BIOPSY COMPARISON:  Previous exams. FINDINGS: The patient was first taken to ultrasound for biopsy of shadowing in the right breast at 10:30 o'clock thought to correspond to the distortion seen on mammogram. Sonographically this area was less conspicuous today. Given the better visualization of the distortion on  mammogram, biopsy was converted to stereotactic guidance. The patient and I discussed the procedure of stereotactic-guided biopsy including benefits and alternatives. We discussed the high likelihood of a successful procedure. We discussed the risks of the procedure including infection, bleeding, tissue injury, clip migration, and inadequate sampling. Informed written consent was given. The usual time out protocol was performed immediately prior to the procedure. Using sterile technique and 1% Lidocaine as local anesthetic, under stereotactic guidance, a 9 gauge vacuum assisted device was used to perform core needle biopsy of distortion in the upper-outer quadrant of the right breast at 10:30 o'clock using a superior approach. Lesion quadrant: Upper outer quadrant At the conclusion of the procedure, an X tissue marker clip was deployed into the biopsy cavity. Follow-up 2-view mammogram was performed and dictated separately. IMPRESSION: Stereotactic-guided biopsy of distortion in the upper-outer quadrant of the right breast. No apparent complications. Electronically Signed: By: Audie Pinto M.D. On: 12/10/2019 11:34   MM RT RADIO FREQUENCY TAG LOC MAMMO GUIDE  Result Date: 12/20/2019 CLINICAL DATA:  Patient presents for RF tag localization prior to surgical excision of a biopsy-proven malignancy over the upper-outer quadrant of the right breast marked by an X shaped biopsy clip. EXAM: NEEDLE LOCALIZATION OF THE RIGHT BREAST WITH MAMMO GUIDANCE COMPARISON:  Previous exams. FINDINGS: Patient presents for needle localization prior to surgical excision. I met with the patient and we discussed the procedure of needle localization including benefits and alternatives. We discussed the high likelihood of a successful procedure. We discussed the risks of the procedure, including infection, bleeding, tissue injury, and further surgery. Informed, written consent was given. The usual time-out protocol was performed  immediately prior to the procedure. Using mammographic guidance, sterile technique, 1% lidocaine and a 5 cm RF tag, the targeted X shaped biopsy clip was localized using a lateral to medial approach. The RF tag was placed as the center of the tag lies approximately 6 mm medial to the clip on the CC image and abuts the posterior aspect of the clip on the lateral image. RF tag #[id number] was used. the images were marked for Dr. Christian Mate. IMPRESSION: Radar reflector localization of the right breast. No apparent complications. Electronically Signed   By: Marin Olp M.D.   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  She does not require adjuvant chemotherapy.  Patient was given a referral to radiation oncology for consideration of adjuvant XRT.  When she completes her treatments, she will benefit from either tamoxifen or letrozole depending on her menopausal status.  Patient has had a hysterectomy, therefore will order LH and FSH.  Return to clinic at the conclusion of her XRT to initiate treatment.   I spent a total of 30 minutes reviewing chart data, face-to-face evaluation with the patient, counseling and coordination of care as detailed above.   Patient expressed understanding and was in agreement with this plan. She also understands that She can call clinic at any time with any questions, concerns, or complaints.   Cancer Staging Malignant neoplasm of upper-outer quadrant of right female breast [facility]) Staging form: Breast, AJCC 8th Edition - Clinical stage from 01/03/2020: Stage IA (cT1b, cN0, cM0, G1, ER+, PR+, HER2-) - Signed by Lloyd Huger, MD on  01/03/2020   Lloyd Huger, MD   01/03/2020 9:21 PM

## 2019-12-30 LAB — SURGICAL PATHOLOGY

## 2019-12-31 ENCOUNTER — Ambulatory Visit: Payer: Managed Care, Other (non HMO) | Admitting: Oncology

## 2019-12-31 NOTE — Progress Notes (Signed)
Invasive mammary carcinoma

## 2020-01-02 ENCOUNTER — Other Ambulatory Visit: Payer: Self-pay

## 2020-01-02 ENCOUNTER — Encounter: Payer: Self-pay | Admitting: Oncology

## 2020-01-02 ENCOUNTER — Encounter: Payer: Managed Care, Other (non HMO) | Admitting: Surgery

## 2020-01-02 NOTE — Progress Notes (Signed)
Patient prescreened for appointment. Patient has no concerns or questions.  

## 2020-01-03 ENCOUNTER — Inpatient Hospital Stay: Payer: Managed Care, Other (non HMO) | Attending: Oncology | Admitting: Oncology

## 2020-01-03 ENCOUNTER — Encounter: Payer: Self-pay | Admitting: Oncology

## 2020-01-03 ENCOUNTER — Other Ambulatory Visit: Payer: Self-pay

## 2020-01-03 VITALS — BP 135/91 | HR 71 | Temp 97.3°F | Resp 18 | Wt 202.7 lb

## 2020-01-03 DIAGNOSIS — C50411 Malignant neoplasm of upper-outer quadrant of right female breast: Secondary | ICD-10-CM | POA: Diagnosis present

## 2020-01-03 DIAGNOSIS — J45909 Unspecified asthma, uncomplicated: Secondary | ICD-10-CM | POA: Insufficient documentation

## 2020-01-03 DIAGNOSIS — N920 Excessive and frequent menstruation with regular cycle: Secondary | ICD-10-CM | POA: Diagnosis not present

## 2020-01-03 DIAGNOSIS — Z79899 Other long term (current) drug therapy: Secondary | ICD-10-CM | POA: Diagnosis not present

## 2020-01-03 DIAGNOSIS — K2100 Gastro-esophageal reflux disease with esophagitis, without bleeding: Secondary | ICD-10-CM | POA: Insufficient documentation

## 2020-01-03 DIAGNOSIS — E042 Nontoxic multinodular goiter: Secondary | ICD-10-CM | POA: Insufficient documentation

## 2020-01-03 DIAGNOSIS — Z170 Estrogen receptor positive status [ER+]: Secondary | ICD-10-CM | POA: Insufficient documentation

## 2020-01-07 ENCOUNTER — Encounter: Payer: Self-pay | Admitting: Surgery

## 2020-01-07 ENCOUNTER — Other Ambulatory Visit: Payer: Self-pay

## 2020-01-07 ENCOUNTER — Ambulatory Visit (INDEPENDENT_AMBULATORY_CARE_PROVIDER_SITE_OTHER): Payer: Self-pay | Admitting: Surgery

## 2020-01-07 VITALS — BP 139/84 | HR 74 | Temp 97.9°F | Resp 12 | Ht 69.0 in | Wt 201.0 lb

## 2020-01-07 DIAGNOSIS — C50411 Malignant neoplasm of upper-outer quadrant of right female breast: Secondary | ICD-10-CM

## 2020-01-07 NOTE — Patient Instructions (Signed)
Follow up as needed. Please call the office if you have any questions or concerns.   Lumpectomy, Care After This sheet gives you information about how to care for yourself after your procedure. Your health care provider may also give you more specific instructions. If you have problems or questions, contact your health care provider. What can I expect after the procedure? After the procedure, it is common to have:  Breast swelling.  Breast tenderness.  Stiffness in your arm or shoulder.  A change in the shape and feel of your breast.  Scar tissue that feels hard to the touch in the area where the lump was removed. Follow these instructions at home: Medicines  Take over-the-counter and prescription medicines only as told by your health care provider.  If you were prescribed an antibiotic medicine, take it as told by your health care provider. Do not stop taking the antibiotic even if you start to feel better.  Ask your health care provider if the medicine prescribed to you: ? Requires you to avoid driving or using heavy machinery. ? Can cause constipation. You may need to take these actions to prevent or treat constipation:  Drink enough fluid to keep your urine pale yellow.  Take over-the-counter or prescription medicines.  Eat foods that are high in fiber, such as beans, whole grains, and fresh fruits and vegetables.  Limit foods that are high in fat and processed sugars, such as fried or sweet foods. Incision care      Follow instructions from your health care provider about how to take care of your incision. Make sure you: ? Wash your hands with soap and water before and after you change your bandage (dressing). If soap and water are not available, use hand sanitizer. ? Change your dressing as told by your health care provider. ? Leave stitches (sutures), skin glue, or adhesive strips in place. These skin closures may need to stay in place for 2 weeks or longer. If  adhesive strip edges start to loosen and curl up, you may trim the loose edges. Do not remove adhesive strips completely unless your health care provider tells you to do that.  Check your incision area every day for signs of infection. Check for: ? More redness, swelling, or pain. ? Fluid or blood. ? Warmth. ? Pus or a bad smell.  Keep your dressing clean and dry.  If you were sent home with a surgical drain in place, follow instructions from your health care provider about emptying it. Bathing  Do not take baths, swim, or use a hot tub until your health care provider approves.  Ask your health care provider if you may take showers. You may only be allowed to take sponge baths. Activity  Rest as told by your health care provider.  Avoid sitting for a long time without moving. Get up to take short walks every 1-2 hours. This is important to improve blood flow and breathing. Ask for help if you feel weak or unsteady.  Return to your normal activities as told by your health care provider. Ask your health care provider what activities are safe for you.  Be careful to avoid any activities that could cause an injury to your arm on the side of your surgery.  Do not lift anything that is heavier than 10 lb (4.5 kg), or the limit that you are told, until your health care provider says that it is safe. Avoid lifting with the arm that is on the side  of your surgery.  Do not carry heavy objects on your shoulder on the side of your surgery.  Do exercises to keep your shoulder and arm from getting stiff and swollen. Talk with your health care provider about which exercises are safe for you. General instructions  Wear a supportive bra as told by your health care provider.  Raise (elevate) your arm above the level of your heart while you are sitting or lying down.  Do not wear tight jewelry on your arm, wrist, or fingers on the side of your surgery.  Keep all follow-up visits as told by your  health care provider. This is important. ? You may need to be screened for extra fluid around the lymph nodes and swelling in the breast and arm (lymphedema). Follow instructions from your health care provider about how often you should be checked.  If you had any lymph nodes removed during your procedure, be sure to tell all of your health care providers. This is important information to share before you are involved in certain procedures, such as having blood tests or having your blood pressure taken. Contact a health care provider if:  You develop a rash.  You have a fever.  Your pain medicine is not working.  You have swelling, weakness, or numbness in your arm that does not improve after a few weeks.  You have new swelling in your breast.  You have any of these signs of infection: ? More redness, swelling, or pain in your incision area. ? Fluid or blood coming from your incision. ? Warmth coming from the incision area. ? Pus or a bad smell coming from your incision. Get help right away if you have:  Very bad pain in your breast or arm.  Swelling in your legs or arms.  Redness, warmth, or pain in your leg or arm.  Chest pain.  Difficulty breathing. Summary  After the procedure, it is common to have breast tenderness, swelling in your breast, and stiffness in your arm and shoulder.  Follow instructions from your health care provider about how to take care of your incision.  Do not lift anything that is heavier than 10 lb (4.5 kg), or the limit that you are told, until your health care provider says that it is safe. Avoid lifting with the arm that is on the side of your surgery.  If you had any lymph nodes removed during your procedure, be sure to tell all of your health care providers. This is important information to share before you are involved in certain procedures, such as having blood tests or having your blood pressure taken. This information is not intended to  replace advice given to you by your health care provider. Make sure you discuss any questions you have with your health care provider. Document Revised: 03/11/2019 Document Reviewed: 03/11/2019 Elsevier Patient Education  [place].

## 2020-01-07 NOTE — Progress Notes (Signed)
[facility] POST-OP OFFICE VISIT  01/07/2020  HPI: Colleen Richard is a 54 y.o. female 13 days s/p right breast RFID lumpectomy with SLNBx.  Margins negative, SLN negative.  No issues, no remarkable pain.  No persistent discoloration, with mild bruising noted and since resolved.   Vital signs: BP 139/84   Pulse 74   Temp 97.9 F (36.6 C)   Resp 12   Ht _0  (1.753 m)   Wt 201 lb (91.2 kg)   SpO2 98%   BMI 29.68 kg/m    Physical Exam: Constitutional: looks great, well.   Skin: right breast/axillary incisions c/d/i, no significant dermabond remains.  No mass effect, soft.   Assessment/Plan: This is a 54 y.o. female 13 days s/p BC surgery for right breast cancer.  Stage IA (cT1b, cN0, cM0, G1, ER+, PR+, HER2-) -   Patient Active Problem List   Diagnosis Date Noted  . Malignant neoplasm of upper-outer quadrant of right female breast ([place]) 12/14/2019  . Encounter for general adult medical examination with abnormal findings 10/23/2019  . Mild intermittent asthma without complication 81/44/8185  . BMI 29.0-29.9,adult 10/23/2019  . Dysuria 10/23/2019  . Migraines 10/09/2018  . Body mass index (bmi) 31.0-31.9, adult 10/09/2018  . Acute non-recurrent pansinusitis 08/08/2018  . Goiter diffuse, nontoxic 05/13/2018  . Menopausal and perimenopausal disorder 05/13/2018  . Vitamin D deficiency 05/13/2018  . Primary osteoarthritis of right shoulder 05/13/2018  . Obesity (BMI 30.0-34.9) 12/19/2017  . DDD (degenerative disc disease), cervical 12/19/2017  . Screening for breast cancer   . Benign neoplasm of ascending colon   . Rectal inflammation   . Acne 11/20/2012  . Bronchospasm 11/20/2012  . Family history of ischemic heart disease 11/20/2012  . Insomnia 11/20/2012    - may proceed with XRT as planned, with f/u right mammography in roughly 6 mos, and clinical exam.     Ronny Bacon M.D., [facility] 01/07/2020, 9:50 AM

## 2020-01-15 ENCOUNTER — Encounter: Payer: Self-pay | Admitting: Radiation Oncology

## 2020-01-15 ENCOUNTER — Other Ambulatory Visit: Payer: Self-pay | Admitting: Emergency Medicine

## 2020-01-15 ENCOUNTER — Other Ambulatory Visit: Payer: Self-pay

## 2020-01-15 DIAGNOSIS — C50411 Malignant neoplasm of upper-outer quadrant of right female breast: Secondary | ICD-10-CM

## 2020-01-16 ENCOUNTER — Inpatient Hospital Stay: Payer: Managed Care, Other (non HMO)

## 2020-01-16 ENCOUNTER — Ambulatory Visit
Admission: RE | Admit: 2020-01-16 | Discharge: 2020-01-16 | Disposition: A | Discharge status: Home / Self Care | Payer: Managed Care, Other (non HMO) | Source: Ambulatory Visit | Attending: Radiation Oncology | Admitting: Radiation Oncology

## 2020-01-16 ENCOUNTER — Encounter: Payer: Self-pay | Admitting: Radiation Oncology

## 2020-01-16 VITALS — BP 135/95 | HR 80 | Temp 97.0°F | Resp 16 | Wt 201.4 lb

## 2020-01-16 DIAGNOSIS — Z170 Estrogen receptor positive status [ER+]: Secondary | ICD-10-CM | POA: Insufficient documentation

## 2020-01-16 DIAGNOSIS — J45909 Unspecified asthma, uncomplicated: Secondary | ICD-10-CM | POA: Insufficient documentation

## 2020-01-16 DIAGNOSIS — E041 Nontoxic single thyroid nodule: Secondary | ICD-10-CM | POA: Insufficient documentation

## 2020-01-16 DIAGNOSIS — C50411 Malignant neoplasm of upper-outer quadrant of right female breast: Secondary | ICD-10-CM

## 2020-01-16 DIAGNOSIS — N920 Excessive and frequent menstruation with regular cycle: Secondary | ICD-10-CM | POA: Insufficient documentation

## 2020-01-16 LAB — CBC WITH DIFFERENTIAL/PLATELET
Abs Immature Granulocytes: 0.01 10*3/uL (ref 0.00–0.07)
Basophils Absolute: 0 10*3/uL (ref 0.0–0.1)
Basophils Relative: 1 %
Eosinophils Absolute: 0.1 10*3/uL (ref 0.0–0.5)
Eosinophils Relative: 3 %
HCT: 40.4 % (ref 36.0–46.0)
Hemoglobin: 13.4 g/dL (ref 12.0–15.0)
Immature Granulocytes: 0 %
Lymphocytes Relative: 31 %
Lymphs Abs: 1.5 10*3/uL (ref 0.7–4.0)
MCH: 28.8 pg (ref 26.0–34.0)
MCHC: 33.2 g/dL (ref 30.0–36.0)
MCV: 86.7 fL (ref 80.0–100.0)
Monocytes Absolute: 0.3 10*3/uL (ref 0.1–1.0)
Monocytes Relative: 7 %
Neutro Abs: 2.8 10*3/uL (ref 1.7–7.7)
Neutrophils Relative %: 58 %
Platelets: 208 10*3/uL (ref 150–400)
RBC: 4.66 MIL/uL (ref 3.87–5.11)
RDW: 12.5 % (ref 11.5–15.5)
WBC: 4.8 10*3/uL (ref 4.0–10.5)
nRBC: 0 % (ref 0.0–0.2)

## 2020-01-16 LAB — COMPREHENSIVE METABOLIC PANEL
ALT: 14 U/L (ref 0–44)
AST: 16 U/L (ref 15–41)
Albumin: 4 g/dL (ref 3.5–5.0)
Alkaline Phosphatase: 72 U/L (ref 38–126)
Anion gap: 7 (ref 5–15)
BUN: 17 mg/dL (ref 6–20)
CO2: 26 mmol/L (ref 22–32)
Calcium: 8.6 mg/dL — ABNORMAL LOW (ref 8.9–10.3)
Chloride: 106 mmol/L (ref 98–111)
Creatinine, Ser: 0.79 mg/dL (ref 0.44–1.00)
GFR calc Af Amer: >60 mL/min (ref >60)
GFR calc non Af Amer: >60 mL/min (ref >60)
Glucose, Bld: 102 mg/dL — ABNORMAL HIGH (ref 70–99)
Potassium: 3.8 mmol/L (ref 3.5–5.1)
Sodium: 139 mmol/L (ref 135–145)
Total Bilirubin: 0.7 mg/dL (ref 0.3–1.2)
Total Protein: 6.8 g/dL (ref 6.5–8.1)

## 2020-01-16 NOTE — Consult Note (Signed)
NEW PATIENT EVALUATION  Name: Colleen Richard  MRN: [id number]  Date:   01/16/2020     DOB: 19-Aug-1966   This 54 y.o. 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  Status post wide local excision and sentinel node biopsy  REFERRING PHYSICIAN: Ronnell Freshwater, NP  CHIEF COMPLAINT:  Chief Complaint  Patient presents with  . Breast Cancer    DIAGNOSIS: The encounter diagnosis was Malignant neoplasm of upper-outer quadrant of right female breast, unspecified estrogen receptor status ([place]).   PREVIOUS INVESTIGATIONS:  Mammogram and ultrasound reviewed Surgical pathology report reviewed Clinical notes reviewed  HPI: Patient is a 54 year old female who presented with an abnormal mammogram of her right breast.  She was found to have a 0.6 cm mass in the upper outer right breast.  There is no abnormal right axillary lymph nodes this was confirmed on ultrasound and she underwent biopsy under ultrasound guidance showing invasive mammary carcinoma ER/PR positive HER-2/neu negative.  Tumor was overall grade 1 she went on to have a wide local excision for 8 mm invasive mammary carcinoma overall grade 1.  Margins were clear for invasive carcinoma at 0.5 mm DCIS margin was close at 1 mm.  These were both the inferior margin.  3 sentinel lymph nodes showed no evidence of metastatic disease.  She has been seen by medical oncology and is not a candidate based on her stage and tumor size for systemic chemotherapy.  She will receive antiestrogen therapy after completion of radiation.  She is seen today for radiation oncology consultation she is doing well.  She specifically denies breast tenderness cough or bone pain.  PLANNED TREATMENT REGIMEN: Right whole breast radiation  PAST MEDICAL HISTORY:  has a past medical history of Abnormal mammogram, Asthma, Cancer  ([place]) (12/2019), Menorrhagia, Migraines, Multiple thyroid nodules (12/2019), and Reflux esophagitis.    PAST SURGICAL HISTORY:  Past Surgical History:  Procedure Laterality Date  . ABDOMINAL HYSTERECTOMY  2004  . BREAST BIOPSY Right 08/26/2015   stereo, top hat clip   . BREAST BIOPSY Right 12/10/2019   affirm, x marker pending path   . BREAST EXCISIONAL BIOPSY Bilateral 2010   neg  . BREAST LUMPECTOMY WITH RADIOFREQUENCY TAG IDENTIFICATION Right 12/25/2019   Procedure: BREAST LUMPECTOMY WITH RADIOFREQUENCY TAG IDENTIFICATION;  Surgeon: Ronny Bacon, MD;  Location: [facility] ORS;  Service: General;  Laterality: Right;  . CATARACT EXTRACTION, BILATERAL  01/2013, 2015  . COLONOSCOPY WITH PROPOFOL N/A 11/03/2017   Procedure: COLONOSCOPY WITH PROPOFOL;  Surgeon: Virgel Manifold, MD;  Location: [facility] ENDOSCOPY;  Service: Endoscopy;  Laterality: N/A;  . EYE SURGERY  2018   cataract and retinal detachment  . 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 PARTIAL HYSTERECTOMY  2004  . SENTINEL NODE BIOPSY Right 12/25/2019   Procedure: SENTINEL NODE BIOPSY;  Surgeon: Ronny Bacon, MD;  Location: [facility] ORS;  Service: General;  Laterality: Right;  . TUBAL LIGATION      FAMILY HISTORY: family history includes Alzheimer's disease in her maternal aunt and maternal grandmother; Asthma in her mother; Atrial fibrillation in her mother; COPD in her mother; Heart disease in her father.  SOCIAL HISTORY:  reports that she has never smoked. She has never used smokeless tobacco. She reports that she does not drink alcohol or use drugs.  ALLERGIES: Patient has no known allergies.  MEDICATIONS:  Current Outpatient Medications  Medication Sig Dispense Refill  . albuterol (VENTOLIN HFA) 108 (90 Base) MCG/ACT inhaler Inhale 2 puffs into the lungs every 6 (six) hours as needed for wheezing or shortness of breath. 18 g 3  . aspirin-acetaminophen-caffeine (EXCEDRIN MIGRAINE) 268-341-96 MG tablet  Take 2 tablets by mouth every 6 (six) hours as needed for headache.    . phentermine (ADIPEX-P) 37.5 MG tablet Take 1 tablet (37.5 mg total) by mouth daily before breakfast. 30 tablet 1  . tretinoin (RETIN-A) 0.05 % cream Apply topically at bedtime. 45 g 0   No current facility-administered medications for this encounter.    ECOG PERFORMANCE STATUS:  0 - Asymptomatic  REVIEW OF SYSTEMS: Patient denies any weight loss, fatigue, weakness, fever, chills or night sweats. Patient denies any loss of vision, blurred vision. Patient denies any ringing  of the ears or hearing loss. No irregular heartbeat. Patient denies heart murmur or history of fainting. Patient denies any chest pain or pain radiating to her upper extremities. Patient denies any shortness of breath, difficulty breathing at night, cough or hemoptysis. Patient denies any swelling in the lower legs. Patient denies any nausea vomiting, vomiting of blood, or coffee ground material in the vomitus. Patient denies any stomach pain. Patient states has had normal bowel movements no significant constipation or diarrhea. Patient denies any dysuria, hematuria or significant nocturia. Patient denies any problems walking, swelling in the joints or loss of balance. Patient denies any skin changes, loss of hair or loss of weight. Patient denies any excessive worrying or anxiety or significant depression. Patient denies any problems with insomnia. Patient denies excessive thirst, polyuria, polydipsia. Patient denies any swollen glands, patient denies easy bruising or easy bleeding. Patient denies any recent infections, allergies or URI. Patient "s visual fields have not changed significantly in recent time.   PHYSICAL EXAM: BP (!) 135/95 (BP Location: Left Arm, Patient Position: Sitting, Cuff Size: Normal)   Pulse 80   Temp (!) 97 F (36.1 C) (Tympanic)   Resp 16   Wt 201 lb 6.4 oz (91.4 kg)   BMI 29.74 kg/m  Right breast shows wide local excision scar  which is healing well.  No dominant mass or nodularity is noted in either breast in 2 positions examined no axillary or supraclavicular adenopathy is appreciated.  Well-developed well-nourished patient in NAD. HEENT reveals PERLA, EOMI, discs not visualized.  Oral cavity is clear. No oral mucosal lesions are identified. Neck is clear without evidence of cervical or supraclavicular adenopathy. Lungs are clear to A&P. Cardiac examination is essentially unremarkable with regular rate and rhythm without murmur rub or thrill. Abdomen is benign with no organomegaly or masses noted. Motor sensory and DTR levels are equal and symmetric in the upper and lower extremities. Cranial nerves II through XII are grossly intact. Proprioception is intact. No peripheral adenopathy or edema is identified. No motor or sensory levels are noted. Crude visual fields are within normal range.  LABORATORY DATA: Pathology report reviewed    RADIOLOGY RESULTS: Mammogram and ultrasound reviewed compatible with above-stated findings   IMPRESSION: Stage Ia invasive mammary carcinoma of the right breast status post wide local excision and sentinel node biopsy ER/PR positive HER-2 negative in this 54 year old female.  PLAN: This time have recommended whole breast radiation to her breast is large and pendulous making hypofractionated course of treatment difficult I would plan on delivering 5040 cGy in 28 fractions.  Would also boost her scar another 1600 centigrade based on  her close DCIS margin.  Risks and benefits of treatment occluding skin reaction fatigue alteration of blood counts possible inclusion of superficial lung all were discussed in detail with the patient.  I have personally set up and ordered CT simulation for next week.  Patient comprehends my treatment plan well.  She also be a candidate for antiestrogen therapy after completion of radiation.  I would like to take this opportunity to thank you for allowing me to  participate in the care of your patient.Noreene Filbert, MD

## 2020-01-17 LAB — FSH/LH
FSH: 45.9 m[IU]/mL
LH: 32.6 m[IU]/mL

## 2020-01-22 ENCOUNTER — Other Ambulatory Visit: Payer: Self-pay

## 2020-01-23 ENCOUNTER — Ambulatory Visit
Admission: RE | Admit: 2020-01-23 | Discharge: 2020-01-23 | Disposition: A | Discharge status: Home / Self Care | Payer: Managed Care, Other (non HMO) | Source: Ambulatory Visit | Attending: Radiation Oncology | Admitting: Radiation Oncology

## 2020-01-23 DIAGNOSIS — Z510 Encounter for antineoplastic radiation therapy: Secondary | ICD-10-CM | POA: Diagnosis not present

## 2020-01-23 DIAGNOSIS — Z170 Estrogen receptor positive status [ER+]: Secondary | ICD-10-CM | POA: Diagnosis not present

## 2020-01-23 DIAGNOSIS — C50411 Malignant neoplasm of upper-outer quadrant of right female breast: Secondary | ICD-10-CM | POA: Insufficient documentation

## 2020-01-24 ENCOUNTER — Other Ambulatory Visit: Payer: Self-pay | Admitting: *Deleted

## 2020-01-24 DIAGNOSIS — C50411 Malignant neoplasm of upper-outer quadrant of right female breast: Secondary | ICD-10-CM

## 2020-01-30 ENCOUNTER — Ambulatory Visit
Admission: RE | Admit: 2020-01-30 | Discharge: 2020-01-30 | Disposition: A | Discharge status: Home / Self Care | Payer: Managed Care, Other (non HMO) | Source: Ambulatory Visit | Attending: Radiation Oncology | Admitting: Radiation Oncology

## 2020-01-30 DIAGNOSIS — C50411 Malignant neoplasm of upper-outer quadrant of right female breast: Secondary | ICD-10-CM | POA: Diagnosis not present

## 2020-02-03 ENCOUNTER — Ambulatory Visit: Payer: Managed Care, Other (non HMO)

## 2020-02-03 DIAGNOSIS — C50411 Malignant neoplasm of upper-outer quadrant of right female breast: Secondary | ICD-10-CM | POA: Diagnosis not present

## 2020-02-04 ENCOUNTER — Ambulatory Visit
Admission: RE | Admit: 2020-02-04 | Discharge: 2020-02-04 | Disposition: A | Discharge status: Home / Self Care | Payer: Managed Care, Other (non HMO) | Source: Ambulatory Visit | Attending: Radiation Oncology | Admitting: Radiation Oncology

## 2020-02-04 DIAGNOSIS — C50411 Malignant neoplasm of upper-outer quadrant of right female breast: Secondary | ICD-10-CM | POA: Diagnosis not present

## 2020-02-05 ENCOUNTER — Ambulatory Visit
Admission: RE | Admit: 2020-02-05 | Discharge: 2020-02-05 | Disposition: A | Discharge status: Home / Self Care | Payer: Managed Care, Other (non HMO) | Source: Ambulatory Visit | Attending: Radiation Oncology | Admitting: Radiation Oncology

## 2020-02-05 DIAGNOSIS — C50411 Malignant neoplasm of upper-outer quadrant of right female breast: Secondary | ICD-10-CM | POA: Diagnosis not present

## 2020-02-06 ENCOUNTER — Ambulatory Visit
Admission: RE | Admit: 2020-02-06 | Discharge: 2020-02-06 | Disposition: A | Discharge status: Home / Self Care | Payer: Managed Care, Other (non HMO) | Source: Ambulatory Visit | Attending: Radiation Oncology | Admitting: Radiation Oncology

## 2020-02-06 DIAGNOSIS — C50411 Malignant neoplasm of upper-outer quadrant of right female breast: Secondary | ICD-10-CM | POA: Diagnosis not present

## 2020-02-07 ENCOUNTER — Ambulatory Visit
Admission: RE | Admit: 2020-02-07 | Discharge: 2020-02-07 | Disposition: A | Discharge status: Home / Self Care | Payer: Managed Care, Other (non HMO) | Source: Ambulatory Visit | Attending: Radiation Oncology | Admitting: Radiation Oncology

## 2020-02-07 DIAGNOSIS — C50411 Malignant neoplasm of upper-outer quadrant of right female breast: Secondary | ICD-10-CM | POA: Diagnosis not present

## 2020-02-10 ENCOUNTER — Ambulatory Visit
Admission: RE | Admit: 2020-02-10 | Discharge: 2020-02-10 | Disposition: A | Discharge status: Home / Self Care | Payer: Managed Care, Other (non HMO) | Source: Ambulatory Visit | Attending: Radiation Oncology | Admitting: Radiation Oncology

## 2020-02-10 DIAGNOSIS — C50411 Malignant neoplasm of upper-outer quadrant of right female breast: Secondary | ICD-10-CM | POA: Diagnosis not present

## 2020-02-11 ENCOUNTER — Ambulatory Visit
Admission: RE | Admit: 2020-02-11 | Discharge: 2020-02-11 | Disposition: A | Discharge status: Home / Self Care | Payer: Managed Care, Other (non HMO) | Source: Ambulatory Visit | Attending: Radiation Oncology | Admitting: Radiation Oncology

## 2020-02-11 DIAGNOSIS — C50411 Malignant neoplasm of upper-outer quadrant of right female breast: Secondary | ICD-10-CM | POA: Diagnosis not present

## 2020-02-12 ENCOUNTER — Ambulatory Visit
Admission: RE | Admit: 2020-02-12 | Discharge: 2020-02-12 | Disposition: A | Discharge status: Home / Self Care | Payer: Managed Care, Other (non HMO) | Source: Ambulatory Visit | Attending: Radiation Oncology | Admitting: Radiation Oncology

## 2020-02-12 DIAGNOSIS — C50411 Malignant neoplasm of upper-outer quadrant of right female breast: Secondary | ICD-10-CM | POA: Diagnosis not present

## 2020-02-13 ENCOUNTER — Ambulatory Visit: Payer: Managed Care, Other (non HMO)

## 2020-02-13 ENCOUNTER — Encounter: Payer: Self-pay | Admitting: *Deleted

## 2020-02-13 ENCOUNTER — Ambulatory Visit
Admission: RE | Admit: 2020-02-13 | Discharge: 2020-02-13 | Disposition: A | Discharge status: Home / Self Care | Payer: Managed Care, Other (non HMO) | Source: Ambulatory Visit | Attending: Radiation Oncology | Admitting: Radiation Oncology

## 2020-02-13 DIAGNOSIS — C50411 Malignant neoplasm of upper-outer quadrant of right female breast: Secondary | ICD-10-CM

## 2020-02-13 NOTE — Research (Signed)
Met with patient Colleen Richard after her radiation therapy this afternoon for purpose of discussing her interest in the [facility] UPBEAT study. Dr. Grayland Ormond had introduced the study to her a couple of weeks ago and gave her a copy of the informed consent form to take home and review. Patient states she read the consent form a couple of weeks ago and is interested in participating. Informed her of the Cardiac MRI that has to be performed in [place] and she is OK with that. Reviewed the time points for study assessments and Cardiac MRIs with her and she states she will read over the consent form again to see if she has any questions for me. Will plan to meet with her closer to the end of her Radiation therapy and schedule a consent visit and study assessments/Study labs and Cardiac MRI after that. Dr. Grayland Ormond notified of patient's interest in the study and he requests to see patient just prior to the end of her radiation therapy in order to avoid patient making extra clinic visits. Will call patient and to determine her preferences before scheduling additional appointments. [name], BSN, MHA, OCN 02/13/2020 3:02 PM  Spoke with patient in RT clinic this afternoon regarding when she would like to provide written consent for the [place] UPBEAT study. Patient states she prefers to wait until her next appt with Dr. Grayland Ormond on 03/02/2020 to provide consent and begin her study procedures after she completes all of her RT on 03/04/2020.Informed that we will not be able to begin any study procedures until she has been registered. States she is beginning to feel more fatigue and prefers to wait for her cardiac MRI and other study procedures after all of her RT is complete. Will plan consent visit accordingly. [name], BSN, MHA, OCN 02/20/2020 3:18 PM   Met with patient Colleen Richard this afternoon at her clinic appointment with Dr. Grayland Ormond to review Informed Consent form with patient and obtain written  consent for study participation. Ms. Wussow verified that she can hold her breath for more than 10 seconds, and that even though she has a history of asthma, she is able to walk for 2 blocks without experiencing chest pain or shortness of breath. She also denies being claustrophobic. She plans on having her Cardiac MRI performed tomorrow at [facility], and she was reminded of the time and directions were provided to her. Patient reports she has read the informed consent form and does not have any additional questions for me. The potential risks and benefits for study participation were discussed and reviewed with her again that participation is voluntary and that she can withdraw at any time and for any reason. Also informed that she will not be paid directly for her participation, but will receive a $25.00 gift card each time she completes required study activities for 4 different time points. Patient was alone for the consent discussion, but has had the consent for more than 2 weeks to review it. Ms. Lacombe provided written consent for the [place] [facility], Protocol Version Date: 10/23/19 and [facility] Active Date: 11/28/19, the [facility] [place] [id number] Group 1 HIPAA form dated 02/03/16 and the [facility] Release of Information form. The patient was provided copies of ALL signed forms as well as contact information for myself and Jeral Fruit, RN in the event that she has any further questions. Patient was found to meet all of the eligibility and none of the ineligibility criteria for  study participation. 2nd review for eligibility was performed by Jeral Fruit, RN, and Dr. Grayland Ormond agrees that the patient is eligible for the UPBEAT study based on the most recent amendment. He plans to start the patient on Femara on 03/05/2020 and has given her a prescription for this. Patient was registered to the study via OPEN this afternoon and cardiac MRI information has been  provided to [place] for her appointment tomorrow. Ms. Geist will return to the clinic on 03/04/20 and complete her study assessments and have her Central study labs collected at that time. [name], BSN, MHA, OCN 03/02/2020 4:06 PM

## 2020-02-14 ENCOUNTER — Ambulatory Visit
Admission: RE | Admit: 2020-02-14 | Discharge: 2020-02-14 | Disposition: A | Discharge status: Home / Self Care | Payer: Managed Care, Other (non HMO) | Source: Ambulatory Visit | Attending: Radiation Oncology | Admitting: Radiation Oncology

## 2020-02-14 DIAGNOSIS — C50411 Malignant neoplasm of upper-outer quadrant of right female breast: Secondary | ICD-10-CM | POA: Diagnosis not present

## 2020-02-18 ENCOUNTER — Ambulatory Visit
Admission: RE | Admit: 2020-02-18 | Discharge: 2020-02-18 | Disposition: A | Discharge status: Home / Self Care | Payer: Managed Care, Other (non HMO) | Source: Ambulatory Visit | Attending: Radiation Oncology | Admitting: Radiation Oncology

## 2020-02-18 DIAGNOSIS — C50411 Malignant neoplasm of upper-outer quadrant of right female breast: Secondary | ICD-10-CM | POA: Insufficient documentation

## 2020-02-18 DIAGNOSIS — Z510 Encounter for antineoplastic radiation therapy: Secondary | ICD-10-CM | POA: Diagnosis not present

## 2020-02-18 DIAGNOSIS — Z170 Estrogen receptor positive status [ER+]: Secondary | ICD-10-CM | POA: Insufficient documentation

## 2020-02-19 ENCOUNTER — Ambulatory Visit
Admission: RE | Admit: 2020-02-19 | Discharge: 2020-02-19 | Disposition: A | Discharge status: Home / Self Care | Payer: Managed Care, Other (non HMO) | Source: Ambulatory Visit | Attending: Radiation Oncology | Admitting: Radiation Oncology

## 2020-02-19 ENCOUNTER — Inpatient Hospital Stay: Payer: Managed Care, Other (non HMO) | Attending: Oncology

## 2020-02-19 ENCOUNTER — Other Ambulatory Visit: Payer: Self-pay

## 2020-02-19 DIAGNOSIS — Z510 Encounter for antineoplastic radiation therapy: Secondary | ICD-10-CM | POA: Diagnosis not present

## 2020-02-19 DIAGNOSIS — C50411 Malignant neoplasm of upper-outer quadrant of right female breast: Secondary | ICD-10-CM

## 2020-02-19 DIAGNOSIS — Z170 Estrogen receptor positive status [ER+]: Secondary | ICD-10-CM | POA: Insufficient documentation

## 2020-02-19 LAB — CBC
HCT: 39.8 % (ref 36.0–46.0)
Hemoglobin: 13.5 g/dL (ref 12.0–15.0)
MCH: 29.4 pg (ref 26.0–34.0)
MCHC: 33.9 g/dL (ref 30.0–36.0)
MCV: 86.7 fL (ref 80.0–100.0)
Platelets: 221 10*3/uL (ref 150–400)
RBC: 4.59 MIL/uL (ref 3.87–5.11)
RDW: 12.3 % (ref 11.5–15.5)
WBC: 5.5 10*3/uL (ref 4.0–10.5)
nRBC: 0 % (ref 0.0–0.2)

## 2020-02-20 ENCOUNTER — Ambulatory Visit
Admission: RE | Admit: 2020-02-20 | Discharge: 2020-02-20 | Disposition: A | Discharge status: Home / Self Care | Payer: Managed Care, Other (non HMO) | Source: Ambulatory Visit | Attending: Radiation Oncology | Admitting: Radiation Oncology

## 2020-02-20 DIAGNOSIS — Z510 Encounter for antineoplastic radiation therapy: Secondary | ICD-10-CM | POA: Diagnosis not present

## 2020-02-21 ENCOUNTER — Ambulatory Visit
Admission: RE | Admit: 2020-02-21 | Discharge: 2020-02-21 | Disposition: A | Discharge status: Home / Self Care | Payer: Managed Care, Other (non HMO) | Source: Ambulatory Visit | Attending: Radiation Oncology | Admitting: Radiation Oncology

## 2020-02-21 DIAGNOSIS — Z510 Encounter for antineoplastic radiation therapy: Secondary | ICD-10-CM | POA: Diagnosis not present

## 2020-02-24 ENCOUNTER — Ambulatory Visit
Admission: RE | Admit: 2020-02-24 | Discharge: 2020-02-24 | Disposition: A | Discharge status: Home / Self Care | Payer: Managed Care, Other (non HMO) | Source: Ambulatory Visit | Attending: Radiation Oncology | Admitting: Radiation Oncology

## 2020-02-24 DIAGNOSIS — Z510 Encounter for antineoplastic radiation therapy: Secondary | ICD-10-CM | POA: Diagnosis not present

## 2020-02-25 ENCOUNTER — Ambulatory Visit
Admission: RE | Admit: 2020-02-25 | Discharge: 2020-02-25 | Disposition: A | Discharge status: Home / Self Care | Payer: Managed Care, Other (non HMO) | Source: Ambulatory Visit | Attending: Radiation Oncology | Admitting: Radiation Oncology

## 2020-02-25 DIAGNOSIS — Z510 Encounter for antineoplastic radiation therapy: Secondary | ICD-10-CM | POA: Diagnosis not present

## 2020-02-26 ENCOUNTER — Ambulatory Visit
Admission: RE | Admit: 2020-02-26 | Discharge: 2020-02-26 | Disposition: A | Discharge status: Home / Self Care | Payer: Managed Care, Other (non HMO) | Source: Ambulatory Visit | Attending: Radiation Oncology | Admitting: Radiation Oncology

## 2020-02-26 DIAGNOSIS — Z510 Encounter for antineoplastic radiation therapy: Secondary | ICD-10-CM | POA: Diagnosis not present

## 2020-02-27 ENCOUNTER — Ambulatory Visit: Payer: Managed Care, Other (non HMO)

## 2020-02-27 ENCOUNTER — Ambulatory Visit
Admission: RE | Admit: 2020-02-27 | Discharge: 2020-02-27 | Disposition: A | Discharge status: Home / Self Care | Payer: Managed Care, Other (non HMO) | Source: Ambulatory Visit | Attending: Radiation Oncology | Admitting: Radiation Oncology

## 2020-02-27 DIAGNOSIS — Z510 Encounter for antineoplastic radiation therapy: Secondary | ICD-10-CM | POA: Diagnosis not present

## 2020-02-28 ENCOUNTER — Ambulatory Visit
Admission: RE | Admit: 2020-02-28 | Discharge: 2020-02-28 | Disposition: A | Discharge status: Home / Self Care | Payer: Managed Care, Other (non HMO) | Source: Ambulatory Visit | Attending: Radiation Oncology | Admitting: Radiation Oncology

## 2020-02-28 ENCOUNTER — Other Ambulatory Visit (HOSPITAL_COMMUNITY): Payer: Self-pay | Admitting: Oncology

## 2020-02-28 ENCOUNTER — Ambulatory Visit: Payer: Managed Care, Other (non HMO)

## 2020-02-28 DIAGNOSIS — C50919 Malignant neoplasm of unspecified site of unspecified female breast: Secondary | ICD-10-CM

## 2020-02-28 DIAGNOSIS — Z510 Encounter for antineoplastic radiation therapy: Secondary | ICD-10-CM | POA: Diagnosis not present

## 2020-02-29 NOTE — Progress Notes (Signed)
Colleen Richard  [phone] [phone]  ID: Colleen Richard OB: Dec 18, 1965  MR#: [id number]  OKH#:[id number]  Patient Care Team: Ronnell Freshwater, NP as PCP - General (Family Medicine) Theodore Demark, RN as Oncology Nurse Navigator Grayland Ormond, Kathlene November, MD as Consulting Physician (Oncology) Noreene Filbert, MD as Radiation Oncologist (Radiation Oncology)   CHIEF COMPLAINT: Pathologic stage Ia ER/PR positive, HER-2 negative invasive carcinoma of the upper outer quadrant of the right breast.  INTERVAL HISTORY: Patient returns to clinic today for further evaluation and initiation of letrozole.  She has some erythema and burning of her skin from her XRT, but otherwise is tolerating her treatments well and will conclude therapy at the end of the week.  She has no neurologic complaints.  She denies any recent fevers or illnesses.  She has a good appetite and denies weight loss.  She has no chest pain, shortness of breath, cough, or hemoptysis. She denies any nausea, vomiting, constipation, or diarrhea.  She has no urinary complaints.  Patient offers no further specific complaints today.  REVIEW OF SYSTEMS:   Review of Systems  Constitutional: Negative.  Negative for fever, malaise/fatigue and weight loss.  Respiratory: Negative.  Negative for cough, hemoptysis and shortness of breath.   Cardiovascular: Negative.  Negative for chest pain and leg swelling.  Gastrointestinal: Negative.  Negative for abdominal pain.  Genitourinary: Negative.  Negative for dysuria.  Musculoskeletal: Negative.  Negative for back pain.  Skin: Negative.  Negative for rash.  Neurological: Negative.  Negative for dizziness, focal weakness, weakness and headaches.  Psychiatric/Behavioral: Negative.  The patient is not nervous/anxious.     As per HPI. Otherwise, a complete review of systems is negative.  PAST MEDICAL HISTORY: Past Medical History:  Diagnosis Date  . Abnormal  mammogram   . Asthma   . Cancer ([place]) 12/2019   breast cancer on right  . Menorrhagia   . Migraines   . Multiple thyroid nodules 12/2019   biopsied in past. now just following with yearly ultrasounds  . Reflux esophagitis     PAST SURGICAL HISTORY: Past Surgical History:  Procedure Laterality Date  . ABDOMINAL HYSTERECTOMY  2004  . BREAST BIOPSY Right 08/26/2015   stereo, top hat clip   . BREAST BIOPSY Right 12/10/2019   affirm, x marker pending path   . BREAST EXCISIONAL BIOPSY Bilateral 2010   neg  . BREAST LUMPECTOMY WITH RADIOFREQUENCY TAG IDENTIFICATION Right 12/25/2019   Procedure: BREAST LUMPECTOMY WITH RADIOFREQUENCY TAG IDENTIFICATION;  Surgeon: Ronny Bacon, MD;  Location: [facility] ORS;  Service: General;  Laterality: Right;  . CATARACT EXTRACTION, BILATERAL  01/2013, 2015  . COLONOSCOPY WITH PROPOFOL N/A 11/03/2017   Procedure: COLONOSCOPY WITH PROPOFOL;  Surgeon: Virgel Manifold, MD;  Location: [facility] ENDOSCOPY;  Service: Endoscopy;  Laterality: N/A;  . EYE SURGERY  2018   cataract and retinal detachment  . 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 PARTIAL HYSTERECTOMY  2004  . SENTINEL NODE BIOPSY Right 12/25/2019   Procedure: SENTINEL NODE BIOPSY;  Surgeon: Ronny Bacon, MD;  Location: [facility] ORS;  Service: General;  Laterality: Right;  . TUBAL LIGATION      FAMILY HISTORY: Family History  Problem Relation Age of Onset  . COPD Mother   . Asthma Mother   . Atrial fibrillation Mother   . Heart disease Father   . Alzheimer's disease Maternal Grandmother   . Alzheimer's disease Maternal Aunt   . Breast cancer Neg  Hx     ADVANCED DIRECTIVES (Y/N):  N  HEALTH MAINTENANCE: Social History   Tobacco Use  . Smoking status: Never Smoker  . Smokeless tobacco: Never Used  Vaping Use  . Vaping Use: Never used  Substance Use Topics  . Alcohol use: No  . Drug use: No     Colonoscopy:  PAP:  Bone density:  Lipid panel:  No Known  Allergies  Current Outpatient Medications  Medication Sig Dispense Refill  . albuterol (VENTOLIN HFA) 108 (90 Base) MCG/ACT inhaler Inhale 2 puffs into the lungs every 6 (six) hours as needed for wheezing or shortness of breath. 18 g 3  . aspirin-acetaminophen-caffeine (EXCEDRIN MIGRAINE) 253-664-40 MG tablet Take 2 tablets by mouth every 6 (six) hours as needed for headache.    . phentermine (ADIPEX-P) 37.5 MG tablet Take 1 tablet (37.5 mg total) by mouth daily before breakfast. 30 tablet 1  . tretinoin (RETIN-A) 0.05 % cream Apply topically at bedtime. 45 g 0  . letrozole (FEMARA) 2.5 MG tablet Take 1 tablet (2.5 mg total) by mouth daily. 90 tablet 3   No current facility-administered medications for this visit.    OBJECTIVE: Vitals:   03/02/20 1515  BP: (!) 126/91  Pulse: 99  Temp: 99.1 F (37.3 C)  SpO2: 99%     Body mass index is 30.54 kg/m.    ECOG FS:0 - Asymptomatic  General: Well-developed, well-nourished, no acute distress. Eyes: Pink conjunctiva, anicteric sclera. HEENT: Normocephalic, moist mucous membranes. Breast: Right breast and axilla with erythema and mild skin breakdown. Lungs: No audible wheezing or coughing. Heart: Regular rate and rhythm. Abdomen: Soft, nontender, no obvious distention. Musculoskeletal: No edema, cyanosis, or clubbing. Neuro: Alert, answering all questions appropriately. Cranial nerves grossly intact. Skin: No rashes or petechiae noted.   Psych: Normal affect.    LAB RESULTS:  Lab Results  Component Value Date   NA 139 01/16/2020   K 3.8 01/16/2020   CL 106 01/16/2020   CO2 26 01/16/2020   GLUCOSE 102 (H) 01/16/2020   BUN 17 01/16/2020   CREATININE 0.79 01/16/2020   CALCIUM 8.6 (L) 01/16/2020   PROT 6.8 01/16/2020   ALBUMIN 4.0 01/16/2020   AST 16 01/16/2020   ALT 14 01/16/2020   ALKPHOS 72 01/16/2020   BILITOT 0.7 01/16/2020   GFRNONAA >60 01/16/2020   GFRAA >60 01/16/2020    Lab Results  Component Value Date   WBC  5.5 02/19/2020   NEUTROABS 2.8 01/16/2020   HGB 13.5 02/19/2020   HCT 39.8 02/19/2020   MCV 86.7 02/19/2020   PLT 221 02/19/2020     STUDIES: No results found.  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  She does not require adjuvant chemotherapy.  Patient will complete adjuvant XRT later this week.  [place] and LH indicate that patient is postmenopausal, therefore she was given a prescription for letrozole.  She will take treatment for a total of 5 years completing in June 2026.  She will also require a bone mineral density in the next 1 to 2 weeks.  Return to clinic in 3 months for routine evaluation.    I spent a total of 20 minutes reviewing chart data,  face-to-face evaluation with the patient, counseling and coordination of care as detailed above.   Patient expressed understanding and was in agreement with this plan. She also understands that She can call clinic at any time with any questions, concerns, or complaints.   Cancer Staging Malignant neoplasm of upper-outer quadrant of right female breast [facility]) Staging form: Breast, AJCC 8th Edition - Clinical stage from 01/03/2020: Stage IA (cT1b, cN0, cM0, G1, ER+, PR+, HER2-) - Signed by Lloyd Huger, MD on 01/03/2020   Lloyd Huger, MD   03/03/2020 3:04 PM

## 2020-03-02 ENCOUNTER — Encounter: Payer: Self-pay | Admitting: Oncology

## 2020-03-02 ENCOUNTER — Other Ambulatory Visit: Payer: Self-pay

## 2020-03-02 ENCOUNTER — Inpatient Hospital Stay (HOSPITAL_BASED_OUTPATIENT_CLINIC_OR_DEPARTMENT_OTHER): Payer: Managed Care, Other (non HMO) | Admitting: Oncology

## 2020-03-02 ENCOUNTER — Ambulatory Visit
Admission: RE | Admit: 2020-03-02 | Discharge: 2020-03-02 | Disposition: A | Discharge status: Home / Self Care | Payer: Managed Care, Other (non HMO) | Source: Ambulatory Visit | Attending: Radiation Oncology | Admitting: Radiation Oncology

## 2020-03-02 ENCOUNTER — Ambulatory Visit: Payer: Managed Care, Other (non HMO)

## 2020-03-02 VITALS — BP 126/91 | HR 99 | Temp 99.1°F | Ht 69.0 in | Wt 206.8 lb

## 2020-03-02 DIAGNOSIS — Z170 Estrogen receptor positive status [ER+]: Secondary | ICD-10-CM | POA: Diagnosis not present

## 2020-03-02 DIAGNOSIS — C50411 Malignant neoplasm of upper-outer quadrant of right female breast: Secondary | ICD-10-CM | POA: Diagnosis not present

## 2020-03-02 DIAGNOSIS — Z780 Asymptomatic menopausal state: Secondary | ICD-10-CM

## 2020-03-02 DIAGNOSIS — Z510 Encounter for antineoplastic radiation therapy: Secondary | ICD-10-CM | POA: Diagnosis not present

## 2020-03-02 MED ORDER — LETROZOLE 2.5 MG PO TABS: 2.5000 mg | ORAL_TABLET | Freq: Every day | ORAL | 3 refills | Status: DC
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
## 2020-03-03 ENCOUNTER — Ambulatory Visit: Payer: Managed Care, Other (non HMO)

## 2020-03-03 ENCOUNTER — Ambulatory Visit
Admission: RE | Admit: 2020-03-03 | Discharge: 2020-03-03 | Disposition: A | Discharge status: Home / Self Care | Payer: Managed Care, Other (non HMO) | Source: Ambulatory Visit | Attending: Radiation Oncology | Admitting: Radiation Oncology

## 2020-03-03 ENCOUNTER — Ambulatory Visit (HOSPITAL_COMMUNITY)
Admission: RE | Admit: 2020-03-03 | Discharge: 2020-03-03 | Disposition: A | Discharge status: Home / Self Care | Payer: Managed Care, Other (non HMO) | Source: Ambulatory Visit | Attending: Oncology | Admitting: Oncology

## 2020-03-03 DIAGNOSIS — Z510 Encounter for antineoplastic radiation therapy: Secondary | ICD-10-CM | POA: Diagnosis not present

## 2020-03-03 DIAGNOSIS — C50919 Malignant neoplasm of unspecified site of unspecified female breast: Secondary | ICD-10-CM

## 2020-03-04 ENCOUNTER — Other Ambulatory Visit: Payer: Self-pay

## 2020-03-04 ENCOUNTER — Ambulatory Visit: Payer: Managed Care, Other (non HMO)

## 2020-03-04 ENCOUNTER — Inpatient Hospital Stay: Payer: Managed Care, Other (non HMO)

## 2020-03-04 ENCOUNTER — Ambulatory Visit
Admission: RE | Admit: 2020-03-04 | Discharge: 2020-03-04 | Disposition: A | Discharge status: Home / Self Care | Payer: Managed Care, Other (non HMO) | Source: Ambulatory Visit | Attending: Radiation Oncology | Admitting: Radiation Oncology

## 2020-03-04 ENCOUNTER — Encounter: Payer: Self-pay | Admitting: *Deleted

## 2020-03-04 DIAGNOSIS — Z170 Estrogen receptor positive status [ER+]: Secondary | ICD-10-CM

## 2020-03-04 DIAGNOSIS — Z510 Encounter for antineoplastic radiation therapy: Secondary | ICD-10-CM | POA: Diagnosis not present

## 2020-03-04 DIAGNOSIS — C50411 Malignant neoplasm of upper-outer quadrant of right female breast: Secondary | ICD-10-CM

## 2020-03-04 NOTE — Research (Addendum)
Colleen Richard presented to clinic this afternoon as scheduled for her [place] Baseline study visit and Central study lab collection. Patient reports she has fasted for 3.5 hours now - since 11:30 this morning. Study labs were collected at 3:02 PM by [facility], then given to Colleen Richard in the lab with instructions for processing. Patient then taken upstairs for assessment of height, weight, waist circumference and B/P x 2. Her height measured at 69 inches with patient standing straight and looking forward; however no standiometer available. Weight is 208.0 lbs this afternoon, with patient wearing summer weight clothes. Waist measured 39.5 inches at the level of the umbilicus. BMI calculated at 30.7 per [email or web address]://www.hartman-hill.biz/ website. Patient was allowed to sit and rest for 5 minutes, then B/P checked in left arm while patient sitting upright with both feet on the floor and back supported. B/P: 125/88 and HR: 80 on initial reading. Rechecked after one minute - B/P:118/78 and HR: 75. Patient completed the Neurocognitive Battery, Baseline Self-administered Questionnaires, the [place] cardiomyopathy Questionnaire and the new COVID-19 Questionnaire following physical assessment. Patient voiced that she felt comfortable performing each of the physical performance tests. She then completed the 6-minute walk and Physical performance tests. Discussed with patient that she should receive a link via email to be able to complete her study questionnaires electronically at the 3 month time point. Discussed 1 month labs to be collected on 04/09/20 with her radiation follow up visit, and her 3 month assessment will occur at the time of her follow up with Dr. Grayland Ormond in September. Colleen Richard completed her Baseline Cardiac MRI at [facility] yesterday. Patient was thanked for participating in Cancer research and was given a $25.00 [employer] gift card for which she signed as having received.  Colleen Richard, BSN, MHA, Richard 03/04/2020  4:46 PM     Late entry: Patient was also given a copy of the consent form and questionnaire for the DCP-001 study to take home and review with a brief overview of the study and plans for me to contact her next week for her decision whether or not she would like to participate in this study. Colleen Richard, BSN, MHA, Richard 03/09/2020 1:58 PM

## 2020-03-05 ENCOUNTER — Ambulatory Visit: Payer: Managed Care, Other (non HMO)

## 2020-03-05 ENCOUNTER — Other Ambulatory Visit (HOSPITAL_COMMUNITY): Payer: Managed Care, Other (non HMO)

## 2020-03-06 ENCOUNTER — Ambulatory Visit: Payer: Managed Care, Other (non HMO)

## 2020-03-07 ENCOUNTER — Ambulatory Visit: Payer: Managed Care, Other (non HMO)

## 2020-03-09 ENCOUNTER — Ambulatory Visit: Payer: Managed Care, Other (non HMO)

## 2020-03-09 ENCOUNTER — Encounter: Payer: Self-pay | Admitting: *Deleted

## 2020-03-09 DIAGNOSIS — Z170 Estrogen receptor positive status [ER+]: Secondary | ICD-10-CM

## 2020-03-09 DIAGNOSIS — C50411 Malignant neoplasm of upper-outer quadrant of right female breast: Secondary | ICD-10-CM

## 2020-03-09 NOTE — Research (Signed)
T/C made to Colleen Richard Colleen Richard as previously planned with Colleen Richard to obtain her decision on participation in the DCP-001 research study. She was given a copy of the informed consent form and a brief overview of the study at her clinic visit last week to complete baseline activities for the UPBEAT study. Colleen Richard states she has decided to participate in the DCP-001 study and says she prefers to go ahead and provide verbal consent by phone and complete the study today since she is not scheduled to return to the [facility] for a several weeks. Colleen Richard acknowledges that she has read the consent form. Reviewed again with her the purpose of the study, that participation is voluntary and she can withdraw at any time and for any reason, that she will not be paid for participating and that there are essentially no risks identified. Discussed the information that will be reported as part of the study as well as the measures we will take in order to ensure her privacy. Colleen Richard provided verbal consent for participation after she was identified by name and dob, and Colleen Richard was also made aware that this verbal consent process was witnessed by Colleen Fruit, Colleen Richard as verification that the Colleen Richard did provide consent. Colleen Richard provided verbal consent to DCP-001 protocol version date: 03/12/19 as well as corresponding [facility] HIPAA form dated 11/10/14. She has elected to receive her copies of the "signed" and completed forms via email, and these were sent to her personal email on file: [email or web address]; which she had previously verified for me. Colleen Richard completed the required DCP-001 questionnaire/worksheet for the study by phone as well, and was enrolled in the study today. Colleen Richard, BSN, MHA, OCN 03/09/2020 1:53 PM

## 2020-03-10 ENCOUNTER — Ambulatory Visit: Payer: Managed Care, Other (non HMO)

## 2020-03-10 ENCOUNTER — Ambulatory Visit
Admission: RE | Admit: 2020-03-10 | Discharge: 2020-03-10 | Disposition: A | Discharge status: Home / Self Care | Payer: Managed Care, Other (non HMO) | Source: Ambulatory Visit | Attending: Oncology | Admitting: Oncology

## 2020-03-10 DIAGNOSIS — Z170 Estrogen receptor positive status [ER+]: Secondary | ICD-10-CM

## 2020-03-10 DIAGNOSIS — C50411 Malignant neoplasm of upper-outer quadrant of right female breast: Secondary | ICD-10-CM | POA: Insufficient documentation

## 2020-03-10 DIAGNOSIS — Z780 Asymptomatic menopausal state: Secondary | ICD-10-CM | POA: Diagnosis present

## 2020-03-11 ENCOUNTER — Ambulatory Visit: Payer: Managed Care, Other (non HMO)

## 2020-03-12 ENCOUNTER — Ambulatory Visit: Payer: Managed Care, Other (non HMO)

## 2020-03-13 ENCOUNTER — Ambulatory Visit: Payer: Managed Care, Other (non HMO)

## 2020-03-16 ENCOUNTER — Ambulatory Visit: Payer: Managed Care, Other (non HMO)

## 2020-03-17 ENCOUNTER — Ambulatory Visit: Payer: Managed Care, Other (non HMO)

## 2020-03-18 ENCOUNTER — Inpatient Hospital Stay: Payer: Managed Care, Other (non HMO)

## 2020-03-18 ENCOUNTER — Ambulatory Visit: Payer: Managed Care, Other (non HMO)

## 2020-03-19 ENCOUNTER — Ambulatory Visit: Payer: Managed Care, Other (non HMO)

## 2020-03-20 ENCOUNTER — Ambulatory Visit: Payer: Managed Care, Other (non HMO)

## 2020-03-24 ENCOUNTER — Ambulatory Visit: Payer: Managed Care, Other (non HMO)

## 2020-03-25 ENCOUNTER — Ambulatory Visit: Payer: Managed Care, Other (non HMO)

## 2020-04-09 ENCOUNTER — Other Ambulatory Visit: Payer: Self-pay

## 2020-04-09 ENCOUNTER — Ambulatory Visit
Admission: RE | Admit: 2020-04-09 | Discharge: 2020-04-09 | Disposition: A | Discharge status: Home / Self Care | Payer: Managed Care, Other (non HMO) | Source: Ambulatory Visit | Attending: Radiation Oncology | Admitting: Radiation Oncology

## 2020-04-09 ENCOUNTER — Encounter: Payer: Self-pay | Admitting: *Deleted

## 2020-04-09 ENCOUNTER — Encounter: Payer: Self-pay | Admitting: Radiation Oncology

## 2020-04-09 ENCOUNTER — Inpatient Hospital Stay: Payer: Managed Care, Other (non HMO) | Attending: Radiation Oncology

## 2020-04-09 VITALS — BP 141/94 | HR 81 | Temp 97.6°F | Wt 210.4 lb

## 2020-04-09 DIAGNOSIS — C50411 Malignant neoplasm of upper-outer quadrant of right female breast: Secondary | ICD-10-CM

## 2020-04-09 DIAGNOSIS — Z170 Estrogen receptor positive status [ER+]: Secondary | ICD-10-CM

## 2020-04-09 LAB — CBC
HCT: 41.3 % (ref 36.0–46.0)
Hemoglobin: 14.1 g/dL (ref 12.0–15.0)
MCH: 29.4 pg (ref 26.0–34.0)
MCHC: 34.1 g/dL (ref 30.0–36.0)
MCV: 86 fL (ref 80.0–100.0)
Platelets: 191 10*3/uL (ref 150–400)
RBC: 4.8 MIL/uL (ref 3.87–5.11)
RDW: 12.1 % (ref 11.5–15.5)
WBC: 5.3 10*3/uL (ref 4.0–10.5)
nRBC: 0 % (ref 0.0–0.2)

## 2020-04-09 NOTE — Progress Notes (Signed)
Radiation Oncology Follow up Note  Name: Colleen Richard   Date:   04/09/2020 MRN:  [id number] DOB: September 24, 1965    This 54 y.o. 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  REFERRING PROVIDER: Ronnell Freshwater, NP  HPI: Patient is a 54 year old female now at 1 month having completed whole breast radiation to her right breast for stage Ia ER/PR positive invasive mammary carcinoma seen today in routine follow-up she is doing well she specifically denies breast tenderness cough or bone pain.  She has been started on letrozole which is giving her some stomach upsets headaches although she is sticking with medication for now..  COMPLICATIONS OF TREATMENT: none  FOLLOW UP COMPLIANCE: keeps appointments   PHYSICAL EXAM:  BP (!) 141/94 (BP Location: Left Arm, Patient Position: Sitting, Cuff Size: Normal)   Pulse 81   Temp 97.6 F (36.4 C) (Tympanic)   Wt (!) 210 lb 6.4 oz (95.4 kg)   SpO2 100%   BMI 31.07 kg/m  Lungs are clear to A&P cardiac examination essentially unremarkable with regular rate and rhythm. No dominant mass or nodularity is noted in either breast in 2 positions examined. Incision is well-healed. No axillary or supraclavicular adenopathy is appreciated. Cosmetic result is excellent.  Well-developed well-nourished patient in NAD. HEENT reveals PERLA, EOMI, discs not visualized.  Oral cavity is clear. No oral mucosal lesions are identified. Neck is clear without evidence of cervical or supraclavicular adenopathy. Lungs are clear to A&P. Cardiac examination is essentially unremarkable with regular rate and rhythm without murmur rub or thrill. Abdomen is benign with no organomegaly or masses noted. Motor sensory and DTR levels are equal and symmetric in the upper and lower extremities. Cranial  nerves II through XII are grossly intact. Proprioception is intact. No peripheral adenopathy or edema is identified. No motor or sensory levels are noted. Crude visual fields are within normal range.  RADIOLOGY RESULTS: No current films to review  PLAN: Present time she is doing extremely well excellent cosmetic result status post whole breast radiation.  And pleased with her overall progress.  She is having some issues with letrozole although she will stick with Afrin neck several weeks before contacting medical oncology.  I have asked to see her back in 4 to 5 months for follow-up.  Patient knows to call sooner with any concerns.  I would like to take this opportunity to thank you for allowing me to participate in the care of your patient.Noreene Filbert, MD

## 2020-04-09 NOTE — Research (Signed)
Colleen Richard returns to clinic this afternoon for Radiation Oncology follow up and Central Study lab collection for the [place] UPBEAT study. Patient saw Dr. Baruch Gouty first, then had her labs collected peripherally in the Right arm per protocol. Will ship to [facility] this afternoon after labs have been processed. Reviewed next appointment time and 3 month study activities due at her follow up appointment in September. [name], BSN, MHA, OCN 04/09/2020 2:32 PM

## 2020-04-23 ENCOUNTER — Encounter: Payer: Self-pay | Admitting: *Deleted

## 2020-04-23 DIAGNOSIS — Z170 Estrogen receptor positive status [ER+]: Secondary | ICD-10-CM

## 2020-04-23 DIAGNOSIS — C50411 Malignant neoplasm of upper-outer quadrant of right female breast: Secondary | ICD-10-CM

## 2020-04-23 NOTE — Research (Signed)
T/C made to patient Colleen Richard for purpose of completing the CRF28 Cardiovascular Event Form, which was missed at her Month 1 clinic visit. Patient denies being hospitalized at all since beginning the study on 03/02/2020. She also denies experiencing any of the cardiovascular or CVA events included on the form. Explained to patient that this form was due when she came to clinic for labs 2 weeks ago, and she was thanked for her time. [name], BSN, MHA, OCN 04/23/2020 12:09 PM

## 2020-05-05 ENCOUNTER — Encounter: Payer: Self-pay | Admitting: Oncology

## 2020-05-22 ENCOUNTER — Other Ambulatory Visit: Payer: Managed Care, Other (non HMO)

## 2020-05-26 ENCOUNTER — Other Ambulatory Visit (HOSPITAL_COMMUNITY): Payer: Self-pay | Admitting: Oncology

## 2020-05-26 DIAGNOSIS — Z006 Encounter for examination for normal comparison and control in clinical research program: Secondary | ICD-10-CM

## 2020-05-31 NOTE — Progress Notes (Signed)
Colleen Richard  [phone] Fax:(336) [phone]  ID: [name] OB: 1966-05-08  MR#: [id number]  DPO#:[id number]  Patient Care Team: Ronnell Freshwater, NP as PCP - General (Family Medicine) Theodore Demark, RN as Oncology Nurse Navigator Grayland Ormond, Kathlene November, MD as Consulting Physician (Oncology) Noreene Filbert, MD as Radiation Oncologist (Radiation Oncology)   CHIEF COMPLAINT: Pathologic stage Ia ER/PR positive, HER-2 negative invasive carcinoma of the upper outer quadrant of the right breast.  INTERVAL HISTORY: Patient returns to clinic today for routine 46-monthevaluation and to assess her toleration of letrozole.  She continues to have significant hot flashes/night sweats as well as myalgias, but otherwise feels well.  She has no neurologic complaints.  She denies any recent fevers or illnesses.  She has a good appetite and denies weight loss.  She has no chest pain, shortness of breath, cough, or hemoptysis. She denies any nausea, vomiting, constipation, or diarrhea.  She has no urinary complaints.  Patient offers no further specific complaints today.  REVIEW OF SYSTEMS:   Review of Systems  Constitutional: Positive for diaphoresis. Negative for fever, malaise/fatigue and weight loss.  Respiratory: Negative.  Negative for cough, hemoptysis and shortness of breath.   Cardiovascular: Negative.  Negative for chest pain and leg swelling.  Gastrointestinal: Negative.  Negative for abdominal pain.  Genitourinary: Negative.  Negative for dysuria.  Musculoskeletal: Positive for myalgias. Negative for back pain.  Skin: Negative.  Negative for rash.  Neurological: Negative.  Negative for dizziness, focal weakness, weakness and headaches.  Psychiatric/Behavioral: Negative.  The patient is not nervous/anxious.     As per HPI. Otherwise, a complete review of systems is negative.  PAST MEDICAL HISTORY: Past Medical History:  Diagnosis Date  . Abnormal  mammogram   . Asthma   . Cancer ([place] 12/2019   breast cancer on right  . Menorrhagia   . Migraines   . Multiple thyroid nodules 12/2019   biopsied in past. now just following with yearly ultrasounds  . Reflux esophagitis     PAST SURGICAL HISTORY: Past Surgical History:  Procedure Laterality Date  . ABDOMINAL HYSTERECTOMY  2004  . BREAST BIOPSY Right 08/26/2015   stereo, top hat clip   . BREAST BIOPSY Right 12/10/2019   affirm, x marker pending path   . BREAST EXCISIONAL BIOPSY Bilateral 2010   neg  . BREAST LUMPECTOMY WITH RADIOFREQUENCY TAG IDENTIFICATION Right 12/25/2019   Procedure: BREAST LUMPECTOMY WITH RADIOFREQUENCY TAG IDENTIFICATION;  Surgeon: RRonny Bacon MD;  Location: [facility] ORS;  Service: General;  Laterality: Right;  . CATARACT EXTRACTION, BILATERAL  01/2013, 2015  . COLONOSCOPY WITH PROPOFOL N/A 11/03/2017   Procedure: COLONOSCOPY WITH PROPOFOL;  Surgeon: TVirgel Manifold MD;  Location: [facility] ENDOSCOPY;  Service: Endoscopy;  Laterality: N/A;  . EYE SURGERY  2018   cataract and retinal detachment  . 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 PARTIAL HYSTERECTOMY  2004  . SENTINEL NODE BIOPSY Right 12/25/2019   Procedure: SENTINEL NODE BIOPSY;  Surgeon: RRonny Bacon MD;  Location: [facility] ORS;  Service: General;  Laterality: Right;  . TUBAL LIGATION      FAMILY HISTORY: Family History  Problem Relation Age of Onset  . COPD Mother   . Asthma Mother   . Atrial fibrillation Mother   . Heart disease Father   . Alzheimer's disease Maternal Grandmother   . Alzheimer's disease Maternal Aunt   . Breast cancer Neg Hx     ADVANCED DIRECTIVES (  Y/N):  N  HEALTH MAINTENANCE: Social History   Tobacco Use  . Smoking status: Never Smoker  . Smokeless tobacco: Never Used  Vaping Use  . Vaping Use: Never used  Substance Use Topics  . Alcohol use: No  . Drug use: No     Colonoscopy:  PAP:  Bone density:  Lipid panel:  No Known  Allergies  Current Outpatient Medications  Medication Sig Dispense Refill  . albuterol (VENTOLIN HFA) 108 (90 Base) MCG/ACT inhaler Inhale 2 puffs into the lungs every 6 (six) hours as needed for wheezing or shortness of breath. 18 g 3  . aspirin-acetaminophen-caffeine (EXCEDRIN MIGRAINE) 696-295-28 MG tablet Take 2 tablets by mouth every 6 (six) hours as needed for headache.    . letrozole (FEMARA) 2.5 MG tablet Take 1 tablet (2.5 mg total) by mouth daily. 90 tablet 3  . phentermine (ADIPEX-P) 37.5 MG tablet Take 1 tablet (37.5 mg total) by mouth daily before breakfast. 30 tablet 1  . tretinoin (RETIN-A) 0.05 % cream Apply topically at bedtime. 45 g 0   No current facility-administered medications for this visit.    OBJECTIVE: Vitals:   06/05/20 1437  BP: (!) 150/97  Pulse: 79  Resp: 20  Temp: 98 F (36.7 C)  SpO2: 98%     Body mass index is 31.71 kg/m.    ECOG FS:0 - Asymptomatic  General: Well-developed, well-nourished, no acute distress. Eyes: Pink conjunctiva, anicteric sclera. HEENT: Normocephalic, moist mucous membranes. Breast: Exam deferred today. Lungs: No audible wheezing or coughing. Heart: Regular rate and rhythm. Abdomen: Soft, nontender, no obvious distention. Musculoskeletal: No edema, cyanosis, or clubbing. Neuro: Alert, answering all questions appropriately. Cranial nerves grossly intact. Skin: No rashes or petechiae noted. Psych: Normal affect.  LAB RESULTS:  Lab Results  Component Value Date   NA 139 01/16/2020   K 3.8 01/16/2020   CL 106 01/16/2020   CO2 26 01/16/2020   GLUCOSE 102 (H) 01/16/2020   BUN 17 01/16/2020   CREATININE 0.79 01/16/2020   CALCIUM 8.6 (L) 01/16/2020   PROT 6.8 01/16/2020   ALBUMIN 4.0 01/16/2020   AST 16 01/16/2020   ALT 14 01/16/2020   ALKPHOS 72 01/16/2020   BILITOT 0.7 01/16/2020   GFRNONAA >60 01/16/2020   GFRAA >60 01/16/2020    Lab Results  Component Value Date   WBC 5.3 04/09/2020   NEUTROABS 2.8  01/16/2020   HGB 14.1 04/09/2020   HCT 41.3 04/09/2020   MCV 86.0 04/09/2020   PLT 191 04/09/2020     STUDIES: No results found.  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 Pathologic stage Ia ER/PR positive, HER-2 negative invasive carcinoma of the upper outer quadrant of the right breast: Given the low stage and low-grade of patient's tumor, she did not require Oncotype testing and did not require adjuvant chemotherapy.  Patient completed adjuvant XRT.  Previously, [place] and [facility] indicated that patient is postmenopausal, therefore she was given a prescription for letrozole.  She will take treatment for a total of 5 years completing in June 2026.  Patient is having significant hot flashes and myalgias secondary to treatment, but declined switching to anastrozole or tamoxifen at this time.  She will reconsider if she is still symptomatic in 3 months.  Return to clinic in 3 months for routine evaluation at which time she likely can be transitioned to evaluation every 6 months. 2.  Bone health: Bone mineral density on March 10, 2020 reviewed independently with a reported T score of -0.3 which is considered normal.  Repeat in June 2023.    I spent a total of 20 minutes reviewing chart data, face-to-face evaluation with the patient, counseling and coordination of care as detailed above.   Patient expressed understanding and was in agreement with this plan. She also understands that She can call clinic at any time with any questions, concerns, or complaints.   Cancer Staging Malignant neoplasm of upper-outer quadrant of right female breast [facility]) Staging form: Breast, AJCC 8th Edition - Clinical stage from 01/03/2020: Stage IA (cT1b, cN0, cM0, G1, ER+, PR+, HER2-) - Signed by Lloyd Huger, MD on 01/03/2020   Lloyd Huger, MD   06/07/2020 7:36 AM

## 2020-06-04 ENCOUNTER — Encounter: Payer: Self-pay | Admitting: Oncology

## 2020-06-04 NOTE — Progress Notes (Signed)
Patient complains of sweating and body aches and  pain from letrozole. States she is having more migraines and no sleep. Doesn't know if she needs to continue take. Would like to know what provider suggest.  Still having some tenderness in the incision area.

## 2020-06-05 ENCOUNTER — Encounter: Payer: Self-pay | Admitting: *Deleted

## 2020-06-05 ENCOUNTER — Inpatient Hospital Stay: Payer: Managed Care, Other (non HMO) | Attending: Oncology | Admitting: Oncology

## 2020-06-05 ENCOUNTER — Inpatient Hospital Stay: Payer: Managed Care, Other (non HMO)

## 2020-06-05 ENCOUNTER — Other Ambulatory Visit: Payer: Self-pay

## 2020-06-05 VITALS — BP 150/97 | HR 79 | Temp 98.0°F | Resp 20 | Ht 69.0 in | Wt 214.7 lb

## 2020-06-05 DIAGNOSIS — Z006 Encounter for examination for normal comparison and control in clinical research program: Secondary | ICD-10-CM | POA: Insufficient documentation

## 2020-06-05 DIAGNOSIS — C50411 Malignant neoplasm of upper-outer quadrant of right female breast: Secondary | ICD-10-CM | POA: Diagnosis not present

## 2020-06-05 DIAGNOSIS — M791 Myalgia, unspecified site: Secondary | ICD-10-CM | POA: Diagnosis not present

## 2020-06-05 DIAGNOSIS — Z170 Estrogen receptor positive status [ER+]: Secondary | ICD-10-CM

## 2020-06-05 DIAGNOSIS — Z79811 Long term (current) use of aromatase inhibitors: Secondary | ICD-10-CM | POA: Diagnosis not present

## 2020-06-05 DIAGNOSIS — N951 Menopausal and female climacteric states: Secondary | ICD-10-CM | POA: Diagnosis not present

## 2020-06-05 NOTE — Research (Signed)
Colleen Richard presented to clinic this afternoon as scheduled for her [id number] [phone] UPBEAT 67-month study visit and Central study lab collection. Patient reports she has fasted for almost 3 hours now - since 11:30 this morning. Study labs were collected at 2:15 PM and given to [facility] in the lab with instructions for processing. Patient then upstairs for physical assessment of height, weight, waist circumference and B/P x 2 and to see Dr. Grayland Ormond for H&P. Her height was measured at 69 inches with patient standing straight and looking forward. Weight is 214.6 lbs this afternoon, with patient wearing light weight clothes. Waist measured 39.5 inches at the level of the umbilicus. BMI calculated at 31.7 per [email or web address].[email or web address] website. Patient was allowed to sit and rest for 5 minutes, then B/P checked in left arm while patient sitting upright with both feet on the floor and back supported. B/P: 128/81 and HR: 73 on initial reading. Rechecked after one minute - B/P:123/83 and HR: 75. Patient was read aloud the Consent Addendum Form Script for Protocol Version date: 04/23/2020 and she provided verbal consent to receive the 24 month Self-administered questionnaire via email. She had previously consented to receive all of her study questionnaires via email. Patient reports she has not yet received a link via email to be able to complete her Self-administered Questionnaire, the [facility] cardiomyopathy Questionnaire and the new COVID-19 Questionnaire electronically. Will contact the study and remind them to email the patient. [name] voiced that she felt comfortable performing each of the physical performance tests. She completed the 6-minute walk and Physical performance tests followed by the Neurocognitive Battery. Discussed 12 month visit to be performed in June, 2022 and no cardiac MRI is due at that time. [name] Patient was thanked for participating in  Cancer research and was given a $25.00 [employer] gift card for which she signed as having received.  [name], BSN, MHA, OCN 06/05/2020 4:14 PM

## 2020-06-08 ENCOUNTER — Encounter: Payer: Self-pay | Admitting: Hematology and Oncology

## 2020-06-09 ENCOUNTER — Ambulatory Visit (HOSPITAL_COMMUNITY)
Admission: RE | Admit: 2020-06-09 | Discharge: 2020-06-09 | Disposition: A | Discharge status: Home / Self Care | Payer: Managed Care, Other (non HMO) | Source: Ambulatory Visit | Attending: Oncology | Admitting: Oncology

## 2020-06-09 ENCOUNTER — Other Ambulatory Visit: Payer: Self-pay

## 2020-06-09 DIAGNOSIS — Z006 Encounter for examination for normal comparison and control in clinical research program: Secondary | ICD-10-CM | POA: Insufficient documentation

## 2020-06-26 ENCOUNTER — Other Ambulatory Visit: Payer: Managed Care, Other (non HMO)

## 2020-08-15 ENCOUNTER — Encounter: Payer: Self-pay | Admitting: Oncology

## 2020-08-17 ENCOUNTER — Other Ambulatory Visit: Payer: Self-pay | Admitting: *Deleted

## 2020-08-17 MED ORDER — ANASTROZOLE 1 MG PO TABS: 1.0000 mg | ORAL_TABLET | Freq: Every day | ORAL | 3 refills | Status: DC

## 2020-08-29 NOTE — Progress Notes (Signed)
[name]  Telephone:(336) [phone] Fax:(336) [phone]  ID: Colleen Richard OB: Jul 28, 1966  MR#: [id number]  PPJ#:[id number]  Patient Care Team: Ronnell Freshwater, NP as PCP - General (Family Medicine) Theodore Demark, RN as Oncology Nurse Navigator Grayland Ormond, Kathlene November, MD as Consulting Physician (Oncology) Noreene Filbert, MD as Radiation Oncologist (Radiation Oncology)   CHIEF COMPLAINT: Pathologic stage Ia ER/PR positive, HER-2 negative invasive carcinoma of the upper outer quadrant of the right breast.  INTERVAL HISTORY: Patient returns to clinic today for routine 55-monthevaluation and to assess her toleration of anastrozole.  She discontinued letrozole secondary to significant joint pain and myalgias.  Her symptoms resolved after discontinuing treatment.  She only initiated anastrozole in the last week.  She currently is tolerating it well and does not report any side effects. She has no neurologic complaints.  She denies any recent fevers or illnesses.  She has a good appetite and denies weight loss.  She has no chest pain, shortness of breath, cough, or hemoptysis. She denies any nausea, vomiting, constipation, or diarrhea.  She has no urinary complaints.  Patient offers no specific complaints today.  REVIEW OF SYSTEMS:   Review of Systems  Constitutional: Negative.  Negative for diaphoresis, fever, malaise/fatigue and weight loss.  Respiratory: Negative.  Negative for cough, hemoptysis and shortness of breath.   Cardiovascular: Negative.  Negative for chest pain and leg swelling.  Gastrointestinal: Negative.  Negative for abdominal pain.  Genitourinary: Negative.  Negative for dysuria.  Musculoskeletal: Negative.  Negative for back pain and myalgias.  Skin: Negative.  Negative for rash.  Neurological: Negative.  Negative for dizziness, focal weakness, weakness and headaches.  Psychiatric/Behavioral: Negative.  The patient is not nervous/anxious.     As per  HPI. Otherwise, a complete review of systems is negative.  PAST MEDICAL HISTORY: Past Medical History:  Diagnosis Date  . Abnormal mammogram   . Asthma   . Cancer [place] 12[place]2019   breast cancer on right  . Menorrhagia   . Migraines   . Multiple thyroid nodules 12/2019   biopsied in past. now just following with yearly ultrasounds  . Reflux esophagitis     PAST SURGICAL HISTORY: Past Surgical History:  Procedure Laterality Date  . ABDOMINAL HYSTERECTOMY  2004  . BREAST BIOPSY Right 08/26/2015   stereo, top hat clip   . BREAST BIOPSY Right 12/10/2019   affirm, x marker pending path   . BREAST EXCISIONAL BIOPSY Bilateral 2010   neg  . BREAST LUMPECTOMY WITH RADIOFREQUENCY TAG IDENTIFICATION Right 12/25/2019   Procedure: BREAST LUMPECTOMY WITH RADIOFREQUENCY TAG IDENTIFICATION;  Surgeon: RRonny Bacon MD;  Location: [facility] ORS;  Service: General;  Laterality: Right;  . CATARACT EXTRACTION, BILATERAL  01/2013, 2015  . COLONOSCOPY WITH PROPOFOL N/A 11/03/2017   Procedure: COLONOSCOPY WITH PROPOFOL;  Surgeon: TVirgel Manifold MD;  Location: [facility] ENDOSCOPY;  Service: Endoscopy;  Laterality: N/A;  . EYE SURGERY  2018   cataract and retinal detachment  . 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 PARTIAL HYSTERECTOMY  2004  . SENTINEL NODE BIOPSY Right 12/25/2019   Procedure: SENTINEL NODE BIOPSY;  Surgeon: RRonny Bacon MD;  Location: [facility] ORS;  Service: General;  Laterality: Right;  . TUBAL LIGATION      FAMILY HISTORY: Family History  Problem Relation Age of Onset  . COPD Mother   . Asthma Mother   . Atrial fibrillation Mother   . Heart disease Father   . Alzheimer's  disease Maternal Grandmother   . Alzheimer's disease Maternal Aunt   . Breast cancer Neg Hx     ADVANCED DIRECTIVES (Y/N):  N  HEALTH MAINTENANCE: Social History   Tobacco Use  . Smoking status: Never Smoker  . Smokeless tobacco: Never Used  Vaping Use  . Vaping Use: Never used   Substance Use Topics  . Alcohol use: No  . Drug use: No     Colonoscopy:  PAP:  Bone density:  Lipid panel:  No Known Allergies  Current Outpatient Medications  Medication Sig Dispense Refill  . albuterol (VENTOLIN HFA) 108 (90 Base) MCG/ACT inhaler Inhale 2 puffs into the lungs every 6 (six) hours as needed for wheezing or shortness of breath. 18 g 3  . anastrozole (ARIMIDEX) 1 MG tablet Take 1 tablet (1 mg total) by mouth daily. 30 tablet 3  . aspirin-acetaminophen-caffeine (EXCEDRIN MIGRAINE) [id number] MG tablet Take 2 tablets by mouth every 6 (six) hours as needed for headache.    . phentermine (ADIPEX-P) 37.5 MG tablet Take 1 tablet (37.5 mg total) by mouth daily before breakfast. 30 tablet 1  . tretinoin (RETIN-A) 0.05 % cream Apply topically at bedtime. 45 g 0   No current facility-administered medications for this visit.    OBJECTIVE: Vitals:   09/03/20 1418  BP: 140/86  Pulse: 94  Resp: 20  Temp: 97.8 F (36.6 C)  SpO2: 100%     Body mass index is 31.9 kg/m.    ECOG FS:0 - Asymptomatic  General: Well-developed, well-nourished, no acute distress. Eyes: Pink conjunctiva, anicteric sclera. HEENT: Normocephalic, moist mucous membranes. Lungs: No audible wheezing or coughing. Heart: Regular rate and rhythm. Abdomen: Soft, nontender, no obvious distention. Musculoskeletal: No edema, cyanosis, or clubbing. Neuro: Alert, answering all questions appropriately. Cranial nerves grossly intact. Skin: No rashes or petechiae noted. Psych: Normal affect.  LAB RESULTS:  Lab Results  Component Value Date   NA 139 01/16/2020   K 3.8 01/16/2020   CL 106 01/16/2020   CO2 26 01/16/2020   GLUCOSE 102 (H) 01/16/2020   BUN 17 01/16/2020   CREATININE 0.79 01/16/2020   CALCIUM 8.6 (L) 01/16/2020   PROT 6.8 01/16/2020   ALBUMIN 4.0 01/16/2020   AST 16 01/16/2020   ALT 14 01/16/2020   ALKPHOS 72 01/16/2020   BILITOT 0.7 01/16/2020   GFRNONAA >60 01/16/2020   GFRAA >60  01/16/2020    Lab Results  Component Value Date   WBC 5.3 04/09/2020   NEUTROABS 2.8 01/16/2020   HGB 14.1 04/09/2020   HCT 41.3 04/09/2020   MCV 86.0 04/09/2020   PLT 191 04/09/2020     STUDIES: No results found.  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 Pathologic stage Ia ER/PR positive, HER-2 negative invasive carcinoma of the upper outer quadrant of the right breast: Given the low stage and low-grade of patient's tumor, she did not require Oncotype testing and did not require adjuvant chemotherapy.  Patient completed adjuvant XRT.  Previously, [place] and LH indicated that patient is postmenopausal.  She could not tolerate letrozole and was transitioned to anastrozole.  She will take a total of 5 years of treatment completing in June 2026.  Patient will require repeat mammogram in March 2022.  Return to clinic in 3 months for further evaluation and continuation of treatment.  Patient can likely be transitioned to evaluation every 6 months at that point.  2.  Bone health: Bone mineral density on March 10, 2020 reviewed independently with a reported T score of -0.3 which is considered normal.  Repeat in June 2023.    I spent a total of 30 minutes reviewing chart data, face-to-face evaluation with the patient, counseling and coordination of care as detailed above.   Patient expressed understanding and was in agreement with this plan. She also understands that She can call clinic at any time with any questions, concerns, or complaints.   Cancer Staging Malignant neoplasm of upper-outer quadrant of right female breast [facility]) Staging form: Breast, AJCC 8th Edition - Clinical stage from 01/03/2020: Stage IA (cT1b, cN0, cM0, G1, ER+, PR+, HER2-) - Signed by Lloyd Huger, MD on 01/03/2020   Lloyd Huger, MD   09/04/2020 3:59 PM

## 2020-09-03 ENCOUNTER — Other Ambulatory Visit: Payer: Self-pay

## 2020-09-03 ENCOUNTER — Inpatient Hospital Stay: Payer: Managed Care, Other (non HMO) | Attending: Oncology | Admitting: Oncology

## 2020-09-03 ENCOUNTER — Encounter: Payer: Self-pay | Admitting: Oncology

## 2020-09-03 VITALS — BP 140/86 | HR 94 | Temp 97.8°F | Resp 20 | Wt 216.0 lb

## 2020-09-03 DIAGNOSIS — J45909 Unspecified asthma, uncomplicated: Secondary | ICD-10-CM | POA: Diagnosis not present

## 2020-09-03 DIAGNOSIS — C50411 Malignant neoplasm of upper-outer quadrant of right female breast: Secondary | ICD-10-CM | POA: Insufficient documentation

## 2020-09-03 DIAGNOSIS — Z170 Estrogen receptor positive status [ER+]: Secondary | ICD-10-CM | POA: Diagnosis not present

## 2020-09-03 DIAGNOSIS — E042 Nontoxic multinodular goiter: Secondary | ICD-10-CM | POA: Insufficient documentation

## 2020-09-03 DIAGNOSIS — K219 Gastro-esophageal reflux disease without esophagitis: Secondary | ICD-10-CM | POA: Diagnosis not present

## 2020-09-03 DIAGNOSIS — Z79811 Long term (current) use of aromatase inhibitors: Secondary | ICD-10-CM | POA: Insufficient documentation

## 2020-09-03 DIAGNOSIS — Z79899 Other long term (current) drug therapy: Secondary | ICD-10-CM | POA: Diagnosis not present

## 2020-09-21 ENCOUNTER — Telehealth: Payer: Self-pay

## 2020-09-21 ENCOUNTER — Encounter: Payer: Self-pay | Admitting: Nurse Practitioner

## 2020-09-21 NOTE — Telephone Encounter (Signed)
Pt called in wanting to schedule an ultrasound appt that was ordered and cancelled twice in September and October. I explained to her that we would have to make her an appt to have the ultrasound reordered so insurance will cover the cost of it and she stated never mind she wasn't going to do all that and hung up the phone.

## 2020-09-22 NOTE — Telephone Encounter (Signed)
Can u go ahead and schedule this pt's [place]

## 2020-09-22 NOTE — Telephone Encounter (Signed)
US

## 2020-09-23 NOTE — Telephone Encounter (Signed)
Spoke with patient and apologized for any confusion and scheduled her thyroid ultrasound. Colleen Richard

## 2020-10-07 ENCOUNTER — Ambulatory Visit (INDEPENDENT_AMBULATORY_CARE_PROVIDER_SITE_OTHER): Payer: Managed Care, Other (non HMO)

## 2020-10-07 ENCOUNTER — Other Ambulatory Visit: Payer: Self-pay

## 2020-10-07 DIAGNOSIS — E040 Nontoxic diffuse goiter: Secondary | ICD-10-CM | POA: Diagnosis not present

## 2020-10-14 ENCOUNTER — Encounter: Payer: Self-pay | Admitting: Radiation Oncology

## 2020-10-14 ENCOUNTER — Ambulatory Visit
Admission: RE | Admit: 2020-10-14 | Discharge: 2020-10-14 | Disposition: A | Discharge status: Home / Self Care | Payer: Managed Care, Other (non HMO) | Source: Ambulatory Visit | Attending: Radiation Oncology | Admitting: Radiation Oncology

## 2020-10-14 ENCOUNTER — Other Ambulatory Visit: Payer: Self-pay

## 2020-10-14 VITALS — BP 155/102 | HR 84 | Temp 97.6°F | Wt 219.0 lb

## 2020-10-14 DIAGNOSIS — Z170 Estrogen receptor positive status [ER+]: Secondary | ICD-10-CM

## 2020-10-14 NOTE — Progress Notes (Signed)
Radiation Oncology Follow up Note  Name: Colleen Richard   Date:   10/14/2020 MRN:  [id number] DOB: 1966-06-14    This 55 y.o. 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  REFERRING PROVIDER: Ronnell Freshwater, NP  HPI: Patient is a 55 year old female now out 6 months having completed whole breast radiation to her right breast for stage Ia invasive mammary carcinoma ER/PR positive.  Seen today in routine follow-up she is doing well she specifically denies breast tenderness cough or bone pain.  She is currently on.  Arimidex tolerating it fairly well.  She is not yet had a mammogram that will be performed in February.  COMPLICATIONS OF TREATMENT: none  FOLLOW UP COMPLIANCE: keeps appointments   PHYSICAL EXAM:  BP (!) 155/102   Pulse 84   Temp 97.6 F (36.4 C) (Tympanic)   Wt 219 lb (99.3 kg)   BMI 32.34 kg/m  Lungs are clear to A&P cardiac examination essentially unremarkable with regular rate and rhythm. No dominant mass or nodularity is noted in either breast in 2 positions examined. Incision is well-healed. No axillary or supraclavicular adenopathy is appreciated. Cosmetic result is excellent.  Well-developed well-nourished patient in NAD. HEENT reveals PERLA, EOMI, discs not visualized.  Oral cavity is clear. No oral mucosal lesions are identified. Neck is clear without evidence of cervical or supraclavicular adenopathy. Lungs are clear to A&P. Cardiac examination is essentially unremarkable with regular rate and rhythm without murmur rub or thrill. Abdomen is benign with no organomegaly or masses noted. Motor sensory and DTR levels are equal and symmetric in the upper and lower extremities. Cranial nerves II through XII are grossly intact. Proprioception is intact. No peripheral adenopathy or edema is identified. No motor or sensory levels are noted.  Crude visual fields are within normal range.  RADIOLOGY RESULTS: No current films for review  PLAN: Present time patient is doing well 6 months out from whole breast radiation very low side effect profile.  She continues on Arimidex without side effect.  I have asked to see her back in 6 months for follow-up and then will start once year follow-up visits.  Patient knows to call with any concerns.  I would like to take this opportunity to thank you for allowing me to participate in the care of your patient.Noreene Filbert, MD

## 2020-10-27 ENCOUNTER — Ambulatory Visit (INDEPENDENT_AMBULATORY_CARE_PROVIDER_SITE_OTHER): Payer: Managed Care, Other (non HMO) | Admitting: Hospice and Palliative Medicine

## 2020-10-27 ENCOUNTER — Encounter: Payer: Self-pay | Admitting: Hospice and Palliative Medicine

## 2020-10-27 ENCOUNTER — Other Ambulatory Visit: Payer: Self-pay | Admitting: Hospice and Palliative Medicine

## 2020-10-27 VITALS — BP 120/80 | HR 93 | Temp 97.8°F | Resp 16 | Ht 69.0 in | Wt 220.0 lb

## 2020-10-27 DIAGNOSIS — Z124 Encounter for screening for malignant neoplasm of cervix: Secondary | ICD-10-CM

## 2020-10-27 DIAGNOSIS — Z0001 Encounter for general adult medical examination with abnormal findings: Secondary | ICD-10-CM | POA: Diagnosis not present

## 2020-10-27 DIAGNOSIS — C50411 Malignant neoplasm of upper-outer quadrant of right female breast: Secondary | ICD-10-CM

## 2020-10-27 DIAGNOSIS — F5101 Primary insomnia: Secondary | ICD-10-CM

## 2020-10-27 DIAGNOSIS — R300 Dysuria: Secondary | ICD-10-CM

## 2020-10-27 DIAGNOSIS — Z170 Estrogen receptor positive status [ER+]: Secondary | ICD-10-CM

## 2020-10-27 DIAGNOSIS — J4520 Mild intermittent asthma, uncomplicated: Secondary | ICD-10-CM | POA: Diagnosis not present

## 2020-10-27 MED ORDER — NYSTATIN 100000 UNIT/GM EX POWD: 1.0000 "application " | Freq: Three times a day (TID) | CUTANEOUS | 0 refills | Status: DC

## 2020-10-27 MED ORDER — ZOLPIDEM TARTRATE 10 MG PO TABS: 10.0000 mg | ORAL_TABLET | Freq: Every evening | ORAL | 1 refills | Status: DC | PRN

## 2020-10-27 NOTE — Progress Notes (Signed)
[facility] [place], [place] [address]  Internal MEDICINE  Office Visit Note  Patient Name: Colleen Richard  [id number]  [id number]  Date of Service: 10/31/2020  Chief Complaint  Patient presents with  . Annual Exam  . Hypothyroidism     HPI Pt is here for routine health maintenance examination Overall, things have been going well History of right breast cancer--invasive carcinoma, stage Ia ER/PR positive, HER-2 negative S/p radiation therapy--currently taking Anastozole for treatment Tolerating well, no changes in appetite or recent weight loss Scheduled for imaging later this month  Continues to use Ambien most nights for insomnia--with ambien sleep is well controlled Due for routine fasting labs--has labs drawn routinely at cancer center  PHM Colonoscopy completed 2019--next due 2029  Current Medication: Outpatient Encounter Medications as of 10/27/2020  Medication Sig  . albuterol (VENTOLIN HFA) 108 (90 Base) MCG/ACT inhaler Inhale 2 puffs into the lungs every 6 (six) hours as needed for wheezing or shortness of breath.  . anastrozole (ARIMIDEX) 1 MG tablet Take 1 tablet (1 mg total) by mouth daily.  Marland Kitchen aspirin-acetaminophen-caffeine (EXCEDRIN MIGRAINE) 250-250-65 MG tablet Take 2 tablets by mouth every 6 (six) hours as needed for headache.  . tretinoin (RETIN-A) 0.05 % cream Apply topically at bedtime.  Marland Kitchen zolpidem (AMBIEN) 10 MG tablet Take 1 tablet (10 mg total) by mouth at bedtime as needed for sleep.  . [DISCONTINUED] phentermine (ADIPEX-P) 37.5 MG tablet Take 1 tablet (37.5 mg total) by mouth daily before breakfast. (Patient not taking: Reported on 10/14/2020)   No facility-administered encounter medications on file as of 10/27/2020.    Surgical History: Past Surgical History:  Procedure Laterality Date  . ABDOMINAL HYSTERECTOMY  2004  . BREAST BIOPSY Right 08/26/2015   stereo, top hat clip   . BREAST BIOPSY Right 12/10/2019   affirm, x  marker pending path   . BREAST EXCISIONAL BIOPSY Bilateral 2010   neg  . BREAST LUMPECTOMY WITH RADIOFREQUENCY TAG IDENTIFICATION Right 12/25/2019   Procedure: BREAST LUMPECTOMY WITH RADIOFREQUENCY TAG IDENTIFICATION;  Surgeon: Ronny Bacon, MD;  Location: [facility] ORS;  Service: General;  Laterality: Right;  . CATARACT EXTRACTION, BILATERAL  01/2013, 2015  . COLONOSCOPY WITH PROPOFOL N/A 11/03/2017   Procedure: COLONOSCOPY WITH PROPOFOL;  Surgeon: Virgel Manifold, MD;  Location: [facility] ENDOSCOPY;  Service: Endoscopy;  Laterality: N/A;  . EYE SURGERY  2018   cataract and retinal detachment  . 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 PARTIAL HYSTERECTOMY  2004  . SENTINEL NODE BIOPSY Right 12/25/2019   Procedure: SENTINEL NODE BIOPSY;  Surgeon: Ronny Bacon, MD;  Location: [facility] ORS;  Service: General;  Laterality: Right;  . TUBAL LIGATION      Medical History: Past Medical History:  Diagnosis Date  . Abnormal mammogram   . Asthma   . Cancer ([place]) 12/2019   breast cancer on right  . Menorrhagia   . Migraines   . Multiple thyroid nodules 12/2019   biopsied in past. now just following with yearly ultrasounds  . Reflux esophagitis     Family History: Family History  Problem Relation Age of Onset  . COPD Mother   . Asthma Mother   . Atrial fibrillation Mother   . Heart disease Father   . Alzheimer's disease Maternal Grandmother   . Alzheimer's disease Maternal Aunt   . Breast cancer Neg Hx       Review of Systems  Constitutional: Negative for chills, diaphoresis and  fatigue.  HENT: Negative for ear pain, postnasal drip and sinus pressure.   Eyes: Negative for photophobia, discharge, redness, itching and visual disturbance.  Respiratory: Negative for cough, shortness of breath and wheezing.   Cardiovascular: Negative for chest pain, palpitations and leg swelling.  Gastrointestinal: Negative for abdominal pain, constipation, diarrhea, nausea and  vomiting.  Genitourinary: Negative for dysuria and flank pain.  Musculoskeletal: Negative for arthralgias, back pain, gait problem and neck pain.  Skin: Negative for color change.  Allergic/Immunologic: Negative for environmental allergies and food allergies.  Neurological: Negative for dizziness and headaches.  Hematological: Does not bruise/bleed easily.  Psychiatric/Behavioral: Negative for agitation, behavioral problems (depression) and hallucinations.     Vital Signs: BP 120/80   Pulse 93   Temp 97.8 F (36.6 C)   Resp 16   Ht '5\' 9"'  (1.753 m)   Wt 220 lb (99.8 kg)   SpO2 99%   BMI 32.49 kg/m    Physical Exam Vitals reviewed. Exam conducted with a chaperone present.  Constitutional:      Appearance: Normal appearance. She is normal weight.  Cardiovascular:     Rate and Rhythm: Normal rate and regular rhythm.     Pulses: Normal pulses.     Heart sounds: Normal heart sounds.  Pulmonary:     Effort: Pulmonary effort is normal.     Breath sounds: Normal breath sounds.  Chest:  Breasts:     Right: Normal.     Left: Normal.    Abdominal:     General: Abdomen is flat.     Palpations: Abdomen is soft.  Genitourinary:    Vagina: Normal.     Cervix: Normal.     Uterus: Normal.   Musculoskeletal:        General: Normal range of motion.     Cervical back: Normal range of motion.  Skin:    General: Skin is warm.  Neurological:     General: No focal deficit present.     Mental Status: She is alert and oriented to person, place, and time. Mental status is at baseline.  Psychiatric:        Mood and Affect: Mood normal.        Behavior: Behavior normal.        Thought Content: Thought content normal.        Judgment: Judgment normal.      LABS: Recent Results (from the past 2160 hour(s))  UA/M w/rflx Culture, Routine     Status: None   Collection Time: 10/27/20  4:39 PM   Specimen: Urine   Urine  Result Value Ref Range   Specific Gravity, UA 1.019 1.005 -  1.030   pH, UA 7.0 5.0 - 7.5   Color, UA Yellow Yellow   Appearance Ur Clear Clear   Leukocytes,UA Negative Negative   Protein,UA Negative Negative/Trace   Glucose, UA Negative Negative   Ketones, UA Negative Negative   RBC, UA Negative Negative   Bilirubin, UA Negative Negative   Urobilinogen, Ur 0.2 0.2 - 1.0 mg/dL   Nitrite, UA Negative Negative   Microscopic Examination Comment     Comment: Microscopic follows if indicated.   Microscopic Examination See below:     Comment: Microscopic was indicated and was performed.   Urinalysis Reflex Comment     Comment: This specimen will not reflex to a Urine Culture.  IGP, Aptima HPV     Status: None   Collection Time: 10/27/20  4:39 PM  Result Value Ref  Range   Interpretation NILM,QC     Comment: NEGATIVE FOR INTRAEPITHELIAL LESION OR MALIGNANCY. THIS SPECIMEN WAS RESCREENED AS PART OF OUR QUALITY CONTROL PROGRAM.    Category NIL     Comment: Negative for Intraepithelial Lesion   Adequacy SECNI,AOCX     Comment: Satisfactory for evaluation. No endocervical component is identified. The absence of an endocervical component was confirmed by an additional screening evaluation.    Clinician Provided ICD10 Comment     Comment: Z12.4   Performed by: Comment     Comment: Elgie Congo, Cytotechnologist (ASCP)   QC reviewed by: Comment     Comment: Neomia Dear, Supervisory Cytotechnologist (ASCP)   Note: Comment     Comment: The Pap smear is a screening test designed to aid in the detection of premalignant and malignant conditions of the uterine cervix.  It is not a diagnostic procedure and should not be used as the sole means of detecting cervical cancer.  Both false-positive and false-negative reports do occur.    Test Methodology Comment     Comment: This liquid based ThinPrep(R) pap test was screened with the use of an image guided system.    HPV Aptima Negative Negative    Comment: This nucleic acid amplification test detects  fourteen high-risk HPV types (16,18,31,33,35,39,45,51,52,56,58,59,66,68) without differentiation.   Microscopic Examination     Status: Abnormal   Collection Time: 10/27/20  4:39 PM   Urine  Result Value Ref Range   WBC, UA None seen 0 - 5 /hpf   RBC 3-10 (A) 0 - 2 /hpf   Epithelial Cells (non renal) None seen 0 - 10 /hpf   Casts None seen None seen /lpf   Bacteria, UA None seen None seen/Few    Assessment/Plan: 1. Encounter for routine adult health examination with abnormal findings Well appearing 55 year old female Lab slip given to have drawn with scheduled labs at cancer center Up to date on PHM--consider BMD  2. Mild intermittent asthma without complication Breathing remains well controlled--continue to monitor  3. Malignant neoplasm of upper-outer quadrant of right breast in female, estrogen receptor positive ([place]) Followed by oncology Due for repeat imaging later this month  4. Primary insomnia Continue with Ambien as needed [place] Controlled Substance Database was reviewed by me for overdose risk score (ORS) - zolpidem (AMBIEN) 10 MG tablet; Take 1 tablet (10 mg total) by mouth at bedtime as needed for sleep.  Dispense: 30 tablet; Refill: 1  5. Dysuria - UA/M w/rflx Culture, Routine - Microscopic Examination  6. Routine cervical smear - IGP, Aptima HPV  General Counseling: Jaleya verbalizes understanding of the findings of todays visit and agrees with plan of treatment. I have discussed any further diagnostic evaluation that may be needed or ordered today. We also reviewed her medications today. she has been encouraged to call the office with any questions or concerns that should arise related to todays visit.    Counseling:    Orders Placed This Encounter  Procedures  . Microscopic Examination  . UA/M w/rflx Culture, Routine    Meds ordered this encounter  Medications  . zolpidem (AMBIEN) 10 MG tablet    Sig: Take 1 tablet (10 mg total) by mouth at bedtime  as needed for sleep.    Dispense:  30 tablet    Refill:  1    Total time spent: 30 Minutes  Time spent includes review of chart, medications, test results, and follow up plan with the patient.   This  patient was seen by Theodoro Grist AGNP-C Collaboration with Dr Lavera Guise as a part of collaborative care agreement   Tanna Furry. [facility] Internal Medicine

## 2020-10-28 ENCOUNTER — Other Ambulatory Visit: Payer: Self-pay

## 2020-10-28 LAB — MICROSCOPIC EXAMINATION
Bacteria, UA: NONE SEEN
Casts: NONE SEEN /lpf
Epithelial Cells (non renal): NONE SEEN /hpf (ref 0–10)
WBC, UA: NONE SEEN /hpf (ref 0–5)

## 2020-10-28 LAB — UA/M W/RFLX CULTURE, ROUTINE
Bilirubin, UA: NEGATIVE
Glucose, UA: NEGATIVE
Ketones, UA: NEGATIVE
Leukocytes,UA: NEGATIVE
Nitrite, UA: NEGATIVE
Protein,UA: NEGATIVE
RBC, UA: NEGATIVE
Specific Gravity, UA: 1.019 (ref 1.005–1.030)
Urobilinogen, Ur: 0.2 mg/dL (ref 0.2–1.0)
pH, UA: 7 (ref 5.0–7.5)

## 2020-10-30 LAB — IGP, APTIMA HPV: HPV Aptima: NEGATIVE

## 2020-10-31 ENCOUNTER — Encounter: Payer: Self-pay | Admitting: Hospice and Palliative Medicine

## 2020-11-05 NOTE — Progress Notes (Signed)
This patient is seeing you next.

## 2020-11-06 ENCOUNTER — Other Ambulatory Visit: Payer: Self-pay | Admitting: Hospice and Palliative Medicine

## 2020-11-09 ENCOUNTER — Ambulatory Visit
Admission: RE | Admit: 2020-11-09 | Discharge: 2020-11-09 | Disposition: A | Discharge status: Home / Self Care | Payer: Managed Care, Other (non HMO) | Source: Ambulatory Visit | Attending: Oncology | Admitting: Oncology

## 2020-11-09 ENCOUNTER — Other Ambulatory Visit: Payer: Self-pay

## 2020-11-09 DIAGNOSIS — Z170 Estrogen receptor positive status [ER+]: Secondary | ICD-10-CM

## 2020-11-09 DIAGNOSIS — C50411 Malignant neoplasm of upper-outer quadrant of right female breast: Secondary | ICD-10-CM | POA: Diagnosis not present

## 2020-11-17 LAB — COMPREHENSIVE METABOLIC PANEL
ALT: 16 IU/L (ref 0–32)
AST: 19 IU/L (ref 0–40)
Albumin/Globulin Ratio: 2 (ref 1.2–2.2)
Albumin: 4.5 g/dL (ref 3.8–4.9)
Alkaline Phosphatase: 88 IU/L (ref 44–121)
BUN/Creatinine Ratio: 21 (ref 9–23)
BUN: 23 mg/dL (ref 6–24)
Bilirubin Total: 0.5 mg/dL (ref 0.0–1.2)
CO2: 20 mmol/L (ref 20–29)
Calcium: 9.3 mg/dL (ref 8.7–10.2)
Chloride: 104 mmol/L (ref 96–106)
Creatinine, Ser: 1.07 mg/dL — ABNORMAL HIGH (ref 0.57–1.00)
GFR calc Af Amer: 68 mL/min/{1.73_m2} (ref >59)
GFR calc non Af Amer: 59 mL/min/{1.73_m2} — ABNORMAL LOW (ref >59)
Globulin, Total: 2.2 g/dL (ref 1.5–4.5)
Glucose: 97 mg/dL (ref 65–99)
Potassium: 4.4 mmol/L (ref 3.5–5.2)
Sodium: 141 mmol/L (ref 134–144)
Total Protein: 6.7 g/dL (ref 6.0–8.5)

## 2020-11-17 LAB — CBC WITH DIFFERENTIAL/PLATELET
Basophils Absolute: 0 10*3/uL (ref 0.0–0.2)
Basos: 1 %
EOS (ABSOLUTE): 0.2 10*3/uL (ref 0.0–0.4)
Eos: 4 %
Hematocrit: 41 % (ref 34.0–46.6)
Hemoglobin: 13.4 g/dL (ref 11.1–15.9)
Immature Grans (Abs): 0 10*3/uL (ref 0.0–0.1)
Immature Granulocytes: 1 %
Lymphocytes Absolute: 1.2 10*3/uL (ref 0.7–3.1)
Lymphs: 29 %
MCH: 28.2 pg (ref 26.6–33.0)
MCHC: 32.7 g/dL (ref 31.5–35.7)
MCV: 86 fL (ref 79–97)
Monocytes Absolute: 0.4 10*3/uL (ref 0.1–0.9)
Monocytes: 8 %
Neutrophils Absolute: 2.5 10*3/uL (ref 1.4–7.0)
Neutrophils: 57 %
Platelets: 221 10*3/uL (ref 150–450)
RBC: 4.75 x10E6/uL (ref 3.77–5.28)
RDW: 12.8 % (ref 11.7–15.4)
WBC: 4.3 10*3/uL (ref 3.4–10.8)

## 2020-11-17 LAB — LIPID PANEL W/O CHOL/HDL RATIO
Cholesterol, Total: 212 mg/dL — ABNORMAL HIGH (ref 100–199)
HDL: 56 mg/dL (ref >39)
LDL Chol Calc (NIH): 134 mg/dL — ABNORMAL HIGH (ref 0–99)
Triglycerides: 121 mg/dL (ref 0–149)
VLDL Cholesterol Cal: 22 mg/dL (ref 5–40)

## 2020-11-17 LAB — TSH+FREE T4
Free T4 by Dialysis: 1.1 ng/dL
TSH: 1.6 uU/mL

## 2020-11-19 ENCOUNTER — Other Ambulatory Visit: Payer: Managed Care, Other (non HMO)

## 2020-11-19 NOTE — Progress Notes (Signed)
Labs reviewed, will be discussed at next office visit.

## 2020-12-03 ENCOUNTER — Inpatient Hospital Stay: Payer: Managed Care, Other (non HMO) | Attending: Nurse Practitioner | Admitting: Nurse Practitioner

## 2020-12-03 ENCOUNTER — Other Ambulatory Visit: Payer: Self-pay

## 2020-12-03 VITALS — BP 128/82 | HR 83 | Temp 99.4°F | Resp 18

## 2020-12-03 DIAGNOSIS — Z79899 Other long term (current) drug therapy: Secondary | ICD-10-CM | POA: Insufficient documentation

## 2020-12-03 DIAGNOSIS — R232 Flushing: Secondary | ICD-10-CM | POA: Diagnosis not present

## 2020-12-03 DIAGNOSIS — Z170 Estrogen receptor positive status [ER+]: Secondary | ICD-10-CM | POA: Insufficient documentation

## 2020-12-03 DIAGNOSIS — Z79811 Long term (current) use of aromatase inhibitors: Secondary | ICD-10-CM | POA: Insufficient documentation

## 2020-12-03 DIAGNOSIS — C50411 Malignant neoplasm of upper-outer quadrant of right female breast: Secondary | ICD-10-CM | POA: Diagnosis not present

## 2020-12-03 DIAGNOSIS — Z923 Personal history of irradiation: Secondary | ICD-10-CM | POA: Insufficient documentation

## 2020-12-03 DIAGNOSIS — J45909 Unspecified asthma, uncomplicated: Secondary | ICD-10-CM | POA: Insufficient documentation

## 2020-12-03 NOTE — Progress Notes (Signed)
[place]  [phone] [phone]) [phone]  ID: [name] OB: [birth year]  MR#: [id number]  WCB#:[id number]  Patient Care Team: Lavera Guise, MD as PCP - General (Internal Medicine) Theodore Demark, RN as Oncology Nurse Navigator Grayland Ormond, Kathlene November, MD as Consulting Physician (Oncology) Noreene Filbert, MD as Radiation Oncologist (Radiation Oncology)   CHIEF COMPLAINT: Pathologic stage Ia ER/PR positive, HER-2 negative invasive carcinoma of the upper outer quadrant of the right breast.  INTERVAL HISTORY: Patient returns to clinic for routine 71-monthevaluation.  She continues anastrozole and is tolerating it really well.  She continues to have bone pains and drenching night sweats.  Hot flashes are tolerable.  Overall she feels that symptoms are improved compared to letrozole.  She would like to continue anastrozole.  Otherwise she feels well.  No neurologic complaints.  She denies any recent fevers or illness.  She has a good appetite and denies weight loss.  She has no chest pain, shortness of breath, cough, hemoptysis.  She denies any nausea, vomiting, constipation, diarrhea.  She denies urinary complaints.  She offers no further specific complaints today.  REVIEW OF SYSTEMS:   Review of Systems  Constitutional: Negative.  Negative for diaphoresis, fever, malaise/fatigue and weight loss.  Respiratory: Negative.  Negative for cough, hemoptysis and shortness of breath.   Cardiovascular: Negative.  Negative for chest pain and leg swelling.  Gastrointestinal: Negative.  Negative for abdominal pain.  Genitourinary: Negative.  Negative for dysuria.  Musculoskeletal: Positive for joint pain and myalgias. Negative for back pain.  Skin: Negative.  Negative for rash.  Neurological: Negative.  Negative for dizziness, focal weakness, weakness and headaches.  Psychiatric/Behavioral: Negative.  The patient is not nervous/anxious.     As per HPI. Otherwise, a  complete review of systems is negative.  PAST MEDICAL HISTORY: Past Medical History:  Diagnosis Date  . Abnormal mammogram   . Asthma   . Cancer ([place] 12/2019   breast cancer on right  . Menorrhagia   . Migraines   . Multiple thyroid nodules 12/2019   biopsied in past. now just following with yearly ultrasounds  . Personal history of radiation therapy 2021   right breast ca  . Reflux esophagitis     PAST SURGICAL HISTORY: Past Surgical History:  Procedure Laterality Date  . ABDOMINAL HYSTERECTOMY  2004  . BREAST BIOPSY Right 08/26/2015   stereo, top hat clip   . BREAST BIOPSY Right 12/10/2019   affirm, x marker,[facility]  . BREAST EXCISIONAL BIOPSY Bilateral 2010   neg  . BREAST LUMPECTOMY Right 12/25/2019   [facility], low grade DCIS, Negative LNs  . BREAST LUMPECTOMY WITH RADIOFREQUENCY TAG IDENTIFICATION Right 12/25/2019   Procedure: BREAST LUMPECTOMY WITH RADIOFREQUENCY TAG IDENTIFICATION;  Surgeon: RRonny Bacon MD;  Location: [facility] ORS;  Service: General;  Laterality: Right;  . CATARACT EXTRACTION, BILATERAL  01/2013, 2015  . COLONOSCOPY WITH PROPOFOL N/A 11/03/2017   Procedure: COLONOSCOPY WITH PROPOFOL;  Surgeon: TVirgel Manifold MD;  Location: [facility] END[facility]COPY;  Service: Endoscopy;  Laterality: N/A;  . EYE SURGERY  2018   cataract and retinal detachment  . 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 PARTIAL HYSTERECTOMY  2004  . SENTINEL NODE BIOPSY Right 12/25/2019   Procedure: SENTINEL NODE BIOPSY;  Surgeon: RRonny Bacon MD;  Location: [facility] ORS;  Service: General;  Laterality: Right;  . TUBAL LIGATION      FAMILY HISTORY: Family History  Problem Relation Age of Onset  .  COPD Mother   . Asthma Mother   . Atrial fibrillation Mother   . Heart disease Father   . Alzheimer's disease Maternal Grandmother   . Alzheimer's disease Maternal Aunt   . Breast cancer Neg Hx     ADVANCED DIRECTIVES (Y/N):  N  HEALTH MAINTENANCE: Social History    Tobacco Use  . Smoking status: Never Smoker  . Smokeless tobacco: Never Used  Vaping Use  . Vaping Use: Never used  Substance Use Topics  . Alcohol use: No  . Drug use: No     Colonoscopy: Completed 2019.  Her next recommendation to repeat in 2029  PAP: pap in 2016. Hysterectomy for benign disease.   Bone density: see below  Lipid panel:   No Known Allergies  Current Outpatient Medications  Medication Sig Dispense Refill  . albuterol (VENTOLIN HFA) 108 (90 Base) MCG/ACT inhaler Inhale 2 puffs into the lungs every 6 (six) hours as needed for wheezing or shortness of breath. 18 g 3  . anastrozole (ARIMIDEX) 1 MG tablet Take 1 tablet (1 mg total) by mouth daily. 30 tablet 3  . aspirin-acetaminophen-caffeine (EXCEDRIN MIGRAINE) 470-962-83 MG tablet Take 2 tablets by mouth every 6 (six) hours as needed for headache.    . nystatin (MYCOSTATIN/NYSTOP) powder Apply 1 application topically 3 (three) times daily. 15 g 0  . tretinoin (RETIN-A) 0.05 % cream Apply topically at bedtime. 45 g 0  . zolpidem (AMBIEN) 10 MG tablet Take 1 tablet (10 mg total) by mouth at bedtime as needed for sleep. 30 tablet 1   No current facility-administered medications for this visit.    OBJECTIVE: Vitals:   12/03/20 1124  BP: 128/82  Pulse: 83  Resp: 18  Temp: 99.4 F (37.4 C)  SpO2: 100%     There is no height or weight on file to calculate BMI.     ECOG FS:0 - Asymptomatic  General: Well-developed, well-nourished, no acute distress. Eyes: Pink conjunctiva, anicteric sclera. Lungs: Clear to auscultation bilaterally.  No audible wheezing or coughing Heart: Regular rate and rhythm.  Abdomen: Soft, nontender, nondistended.  Musculoskeletal: No edema, cyanosis, or clubbing. Neuro: Alert, answering all questions appropriately. Cranial nerves grossly intact. Skin: No rashes or petechiae noted. Psych: Normal affect.  LAB RESULTS:  Lab Results  Component Value Date   NA 141 11/06/2020   K 4.4  11/06/2020   CL 104 11/06/2020   CO2 20 11/06/2020   GLUCOSE 97 11/06/2020   BUN 23 11/06/2020   CREATININE 1.07 (H) 11/06/2020   CALCIUM 9.3 11/06/2020   PROT 6.7 11/06/2020   ALBUMIN 4.5 11/06/2020   AST 19 11/06/2020   ALT 16 11/06/2020   ALKPHOS 88 11/06/2020   BILITOT 0.5 11/06/2020   GFRNONAA 59 (L) 11/06/2020   GFRAA 68 11/06/2020    Lab Results  Component Value Date   WBC 4.3 11/06/2020   NEUTROABS 2.5 11/06/2020   HGB 13.4 11/06/2020   HCT 41.0 11/06/2020   MCV 86 11/06/2020   PLT 221 11/06/2020     STUDIES: MM DIAG BREAST TOMO BILATERAL  Result Date: 11/09/2020 CLINICAL DATA:  History of RIGHT breast cancer status post lumpectomy in April of 2021. EXAM: DIGITAL DIAGNOSTIC BILATERAL MAMMOGRAM WITH TOMOSYNTHESIS AND CAD TECHNIQUE: Bilateral digital diagnostic mammography and breast tomosynthesis was performed. The images were evaluated with computer-aided detection. COMPARISON:  Previous exam(s). ACR Breast Density Category c: The breast tissue is heterogeneously dense, which may obscure small masses. FINDINGS: There are expected postsurgical changes within  the RIGHT breast. There are no new dominant masses, suspicious calcifications or secondary signs of malignancy within either breast. IMPRESSION: No evidence of malignancy within either breast. Expected postsurgical changes within the RIGHT breast. RECOMMENDATION: Bilateral diagnostic mammogram in 1 year. I have discussed the findings and recommendations with the patient. If applicable, a reminder letter will be sent to the patient regarding the next appointment. BI-RADS CATEGORY  2: Benign. Electronically Signed   By: Franki Cabot M.D.   On: 11/09/2020 15:29    ASSESSMENT: Pathologic stage Ia ER/PR positive, HER-2 negative invasive carcinoma of the upper outer quadrant of the right breast.ast  PLAN:    1. Pathologic stage Ia ER/PR positive, HER-2 negative invasive carcinoma of the upper outer quadrant of the right  breast: Given the low stage and low-grade of patient's tumor, she did not require Oncotype testing and did not require adjuvant chemotherapy.  Patient completed adjuvant XRT.  Previously, [place] and [facility] indicated that patient is postmenopausal.  She could not tolerate letrozole and was transitioned to anastrozole. If unable to tolerate arimidex recommend trial of Exemestane/Aromasin for third generation steroidal AI. She will take a total of 5 years of treatment completing in June 2026.  Mammogram reviewed today and I agree with findings. Patient will require repeat mammogram in March 2023.  Return to clinic in 6 months for further evaluation and continuation of treatment.   2.  Bone health: Bone mineral density on March 10, 2020 reviewed independently with a reported T score of -0.3 which is considered normal.  Repeat in June 2023.  Patient expressed understanding and was in agreement with this plan. She also understands that She can call clinic at any time with any questions, concerns, or complaints.   Cancer Staging Malignant neoplasm of upper-outer quadrant of right female breast [facility]) Staging form: Breast, AJCC 8th Edition - Clinical stage from 01/03/2020: Stage IA (cT1b, cN0, cM0, G1, ER+, PR+, HER2-) - Signed by Lloyd Huger, MD on 01/03/2020 Stage prefix: Initial diagnosis Histologic grading system: 3 grade system   Verlon Au, NP   12/03/2020 11:38 AM

## 2020-12-07 MED ORDER — ANASTROZOLE 1 MG PO TABS: 1.0000 mg | ORAL_TABLET | Freq: Every day | ORAL | 3 refills | Status: DC

## 2020-12-13 ENCOUNTER — Other Ambulatory Visit: Payer: Self-pay | Admitting: Oncology

## 2021-01-19 ENCOUNTER — Encounter: Payer: Self-pay | Admitting: Hospice and Palliative Medicine

## 2021-01-19 ENCOUNTER — Ambulatory Visit: Payer: Managed Care, Other (non HMO) | Admitting: Hospice and Palliative Medicine

## 2021-01-19 VITALS — Resp 16 | Ht 69.0 in | Wt 217.0 lb

## 2021-01-19 DIAGNOSIS — J0140 Acute pansinusitis, unspecified: Secondary | ICD-10-CM

## 2021-01-19 DIAGNOSIS — F5101 Primary insomnia: Secondary | ICD-10-CM

## 2021-01-19 MED ORDER — AZITHROMYCIN 250 MG PO TABS: ORAL_TABLET | ORAL | 0 refills | Status: AC

## 2021-01-19 MED ORDER — PREDNISONE 10 MG PO TABS: ORAL_TABLET | ORAL | 0 refills | Status: DC

## 2021-01-19 MED ORDER — ZOLPIDEM TARTRATE 10 MG PO TABS: 10.0000 mg | ORAL_TABLET | Freq: Every evening | ORAL | 1 refills | Status: DC | PRN

## 2021-01-19 MED ORDER — IPRATROPIUM-ALBUTEROL 0.5-2.5 (3) MG/3ML IN SOLN: 3.0000 mL | Freq: Four times a day (QID) | RESPIRATORY_TRACT | 0 refills | Status: AC | PRN

## 2021-01-19 NOTE — Progress Notes (Signed)
[facility] [place], [place] [address]  Internal MEDICINE  Telephone Visit  Patient Name: Colleen Richard  [id number]  [id number]  Date of Service: 01/21/2021  I connected with the patient at 1048 by telephone and verified the patients identity using two identifiers.   I discussed the limitations, risks, security and privacy concerns of performing an evaluation and management service by telephone and the availability of in person appointments. I also discussed with the patient that there may be a patient responsible charge related to the service.  The patient expressed understanding and agrees to proceed.    Chief Complaint  Patient presents with  . Acute Visit    at home covid test neg, no coivd exposure, poss sinus infection, otc meds not helping, discolored drainage coughing up fleam, right ear and throat pain, not vaccinated  . Telephone Assessment    Video or phone call   . Telephone Screen    [phone]     HPI Patient is being seen virtually for acute sick visit Complaining of sinus pain and pressure, congestion, cough and sore throat Symptoms started last week--has been taking OTC cold and sinus medication without much relief in her symptoms At home COVID testing negative Has experienced some shortness of breath and chest tightness--has been using inhaler and nebulizer but requesting refills of nebulizer medication  Current Medication: Outpatient Encounter Medications as of 01/19/2021  Medication Sig  . albuterol (VENTOLIN HFA) 108 (90 Base) MCG/ACT inhaler Inhale 2 puffs into the lungs every 6 (six) hours as needed for wheezing or shortness of breath.  . anastrozole (ARIMIDEX) 1 MG tablet Take 1 tablet (1 mg total) by mouth daily.  Marland Kitchen aspirin-acetaminophen-caffeine (EXCEDRIN MIGRAINE) 250-250-65 MG tablet Take 2 tablets by mouth every 6 (six) hours as needed for headache.  Marland Kitchen azithromycin (ZITHROMAX) 250 MG tablet Take 2 tablets on day 1, then 1  tablet daily on days 2 through 5  . predniSONE (DELTASONE) 10 MG tablet Take 1 tablet three times a day with a meal for three for three days, take 1 tablet by twice daily with a meal for 3 days, take 1 tablet once daily with a meal for 3 days  . [DISCONTINUED] ipratropium-albuterol (DUONEB) 0.5-2.5 (3) MG/3ML SOLN Take 3 mLs by nebulization every 6 (six) hours as needed.  Marland Kitchen ipratropium-albuterol (DUONEB) 0.5-2.5 (3) MG/3ML SOLN Take 3 mLs by nebulization every 6 (six) hours as needed.  . zolpidem (AMBIEN) 10 MG tablet Take 1 tablet (10 mg total) by mouth at bedtime as needed for sleep.  . [DISCONTINUED] nystatin (MYCOSTATIN/NYSTOP) powder Apply 1 application topically 3 (three) times daily. (Patient not taking: Reported on 12/03/2020)  . [DISCONTINUED] tretinoin (RETIN-A) 0.05 % cream Apply topically at bedtime. (Patient not taking: Reported on 12/03/2020)  . [DISCONTINUED] zolpidem (AMBIEN) 10 MG tablet Take 1 tablet (10 mg total) by mouth at bedtime as needed for sleep.   No facility-administered encounter medications on file as of 01/19/2021.    Surgical History: Past Surgical History:  Procedure Laterality Date  . ABDOMINAL HYSTERECTOMY  2004  . BREAST BIOPSY Right 08/26/2015   stereo, top hat clip   . BREAST BIOPSY Right 12/10/2019   affirm, x marker,[facility]  . BREAST EXCISIONAL BIOPSY Bilateral 2010   neg  . BREAST LUMPECTOMY Right 12/25/2019   [facility], low grade DCIS, Negative LNs  . BREAST LUMPECTOMY WITH RADIOFREQUENCY TAG IDENTIFICATION Right 12/25/2019   Procedure: BREAST LUMPECTOMY WITH RADIOFREQUENCY TAG IDENTIFICATION;  Surgeon: Ronny Bacon, MD;  Location: [facility] ORS;  Service: General;  Laterality: Right;  . CATARACT EXTRACTION, BILATERAL  01/2013, 2015  . COLONOSCOPY WITH PROPOFOL N/A 11/03/2017   Procedure: COLONOSCOPY WITH PROPOFOL;  Surgeon: Virgel Manifold, MD;  Location: [facility] ENDOSCOPY;  Service: Endoscopy;  Laterality: N/A;  . EYE SURGERY  2018   cataract and retinal  detachment  . 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 PARTIAL HYSTERECTOMY  2004  . SENTINEL NODE BIOPSY Right 12/25/2019   Procedure: SENTINEL NODE BIOPSY;  Surgeon: Ronny Bacon, MD;  Location: [facility] ORS;  Service: General;  Laterality: Right;  . TUBAL LIGATION      Medical History: Past Medical History:  Diagnosis Date  . Abnormal mammogram   . Asthma   . Cancer ([place]) 12/2019   breast cancer on right  . Menorrhagia   . Migraines   . Multiple thyroid nodules 12/2019   biopsied in past. now just following with yearly ultrasounds  . Personal history of radiation therapy 2021   right breast ca  . Reflux esophagitis     Family History: Family History  Problem Relation Age of Onset  . COPD Mother   . Asthma Mother   . Atrial fibrillation Mother   . Heart disease Father   . Alzheimer's disease Maternal Grandmother   . Alzheimer's disease Maternal Aunt   . Breast cancer Neg Hx     Social History   Socioeconomic History  . Marital status: Married    Spouse name: Linwood  . Number of children: Not on file  . Years of education: Not on file  . Highest education level: Not on file  Occupational History  . Not on file  Tobacco Use  . Smoking status: Never Smoker  . Smokeless tobacco: Never Used  Vaping Use  . Vaping Use: Never used  Substance and Sexual Activity  . Alcohol use: No  . Drug use: No  . Sexual activity: Yes  Other Topics Concern  . Not on file  Social History Narrative   Patient lives husband in her home.   Social Determinants of Health   Financial Resource Strain: Not on file  Food Insecurity: Not on file  Transportation Needs: Not on file  Physical Activity: Not on file  Stress: Not on file  Social Connections: Not on file  Intimate Partner Violence: Not on file      Review of Systems  Constitutional: Negative for chills, diaphoresis and fatigue.  HENT: Positive for congestion, sinus pressure and sinus pain.  Negative for ear pain and postnasal drip.   Eyes: Negative for photophobia, discharge, redness, itching and visual disturbance.  Respiratory: Positive for cough, shortness of breath and wheezing.   Cardiovascular: Negative for chest pain, palpitations and leg swelling.  Gastrointestinal: Negative for abdominal pain, constipation, diarrhea, nausea and vomiting.  Genitourinary: Negative for dysuria and flank pain.  Musculoskeletal: Negative for arthralgias, back pain, gait problem and neck pain.  Skin: Negative for color change.  Allergic/Immunologic: Negative for environmental allergies and food allergies.  Neurological: Negative for dizziness and headaches.  Hematological: Does not bruise/bleed easily.  Psychiatric/Behavioral: Negative for agitation, behavioral problems (depression) and hallucinations.    Vital Signs: Resp 16   Ht 5\' 9"  (1.753 m)   Wt 217 lb (98.4 kg)   BMI 32.05 kg/m    Observation/Objective: Alert and oriented, no evidence of acute distress.  Assessment/Plan: 1. Acute non-recurrent pansinusitis Treat with Zpak and prednisone Continue with nebulizer treatments as needed for  wheezing and shortness of breath Advised to contact office if symptoms worsen or have not improved within 10 days - azithromycin (ZITHROMAX) 250 MG tablet; Take 2 tablets on day 1, then 1 tablet daily on days 2 through 5  Dispense: 6 tablet; Refill: 0 - predniSONE (DELTASONE) 10 MG tablet; Take 1 tablet three times a day with a meal for three for three days, take 1 tablet by twice daily with a meal for 3 days, take 1 tablet once daily with a meal for 3 days  Dispense: 18 tablet; Refill: 0 - ipratropium-albuterol (DUONEB) 0.5-2.5 (3) MG/3ML SOLN; Take 3 mLs by nebulization every 6 (six) hours as needed.  Dispense: 360 mL; Refill: 0  2. Primary insomnia Requesting refills, continue as needed use  Controlled Substance Database was reviewed by me for overdose risk score (ORS) Reviewed risks and  possible side effects associated with taking opiates, benzodiazepines and other CNS depressants. Combination of these could cause dizziness and drowsiness. Advised patient not to drive or operate machinery when taking these medications, as patient's and other's life can be at risk and will have consequences. Patient verbalized understanding in this matter. Dependence and abuse for these drugs will be monitored closely. A Controlled substance policy and procedure is on file which allows [facility] to order a urine drug screen test at any visit. Patient understands and agrees with the plan - zolpidem (AMBIEN) 10 MG tablet; Take 1 tablet (10 mg total) by mouth at bedtime as needed for sleep.  Dispense: 30 tablet; Refill: 1  General Counseling: Zaide verbalizes understanding of the findings of today's phone visit and agrees with plan of treatment. I have discussed any further diagnostic evaluation that may be needed or ordered today. We also reviewed her medications today. she has been encouraged to call the office with any questions or concerns that should arise related to todays visit.   Meds ordered this encounter  Medications  . azithromycin (ZITHROMAX) 250 MG tablet    Sig: Take 2 tablets on day 1, then 1 tablet daily on days 2 through 5    Dispense:  6 tablet    Refill:  0  . predniSONE (DELTASONE) 10 MG tablet    Sig: Take 1 tablet three times a day with a meal for three for three days, take 1 tablet by twice daily with a meal for 3 days, take 1 tablet once daily with a meal for 3 days    Dispense:  18 tablet    Refill:  0  . zolpidem (AMBIEN) 10 MG tablet    Sig: Take 1 tablet (10 mg total) by mouth at bedtime as needed for sleep.    Dispense:  30 tablet    Refill:  1  . ipratropium-albuterol (DUONEB) 0.5-2.5 (3) MG/3ML SOLN    Sig: Take 3 mLs by nebulization every 6 (six) hours as needed.    Dispense:  360 mL    Refill:  0     Time spent:25 Minutes Time spent includes  review of chart, medications, test results and follow-up plan with the patient.  Tanna Furry Rena Hunke AGNP-C Internal medicine

## 2021-01-21 ENCOUNTER — Encounter: Payer: Self-pay | Admitting: Hospice and Palliative Medicine

## 2021-01-27 ENCOUNTER — Other Ambulatory Visit: Payer: Self-pay

## 2021-01-27 ENCOUNTER — Telehealth: Payer: Self-pay | Admitting: Emergency Medicine

## 2021-01-27 DIAGNOSIS — C50411 Malignant neoplasm of upper-outer quadrant of right female breast: Secondary | ICD-10-CM

## 2021-01-27 DIAGNOSIS — Z170 Estrogen receptor positive status [ER+]: Secondary | ICD-10-CM

## 2021-01-27 NOTE — Telephone Encounter (Signed)
WF [id number] Understanding and Predicting Breast Cancer Events after Treatment (UPBEAT)  Called patient to schedule 61-month research visit for this study.  Patient agreed to come in at 1:00pm on 04/15/21 prior to her appointment with Dr. Baruch Gouty.  Scheduled lab appointment at 1:00pm, and research visit at 1:15pm on 04/15/21.  Patient denied questions at this time.  Clabe Seal Clinical Research Coordinator I  01/27/21  11:13 AM

## 2021-04-14 ENCOUNTER — Telehealth: Payer: Self-pay | Admitting: Emergency Medicine

## 2021-04-14 NOTE — Telephone Encounter (Signed)
WF [id number] Understanding and Predicting Breast Cancer Events after Treatment (UPBEAT)  04/14/21 10:43am:  Called patient to remind her of her research visit tomorrow, 04/15/21, at 1:00pm.  Patient was reminded to fast for 3 hours prior to her lab appointment at 1:00pm, and to wear comfortable clothing.  Questionnaires for this time period have been completed by the patient online.    Patient denied questions at this time.  Clabe Seal Clinical Research Coordinator I  04/14/21  10:46 AM

## 2021-04-15 ENCOUNTER — Inpatient Hospital Stay: Payer: 59

## 2021-04-15 ENCOUNTER — Inpatient Hospital Stay: Payer: 59 | Attending: Oncology

## 2021-04-15 ENCOUNTER — Other Ambulatory Visit: Payer: Self-pay

## 2021-04-15 ENCOUNTER — Ambulatory Visit
Admission: RE | Admit: 2021-04-15 | Discharge: 2021-04-15 | Disposition: A | Discharge status: Home / Self Care | Payer: 59 | Source: Ambulatory Visit | Attending: Radiation Oncology | Admitting: Radiation Oncology

## 2021-04-15 ENCOUNTER — Encounter: Payer: Self-pay | Admitting: Radiation Oncology

## 2021-04-15 VITALS — BP 130/84 | HR 88 | Temp 97.3°F | Resp 16 | Wt 213.5 lb

## 2021-04-15 VITALS — Ht 69.0 in

## 2021-04-15 DIAGNOSIS — Z79811 Long term (current) use of aromatase inhibitors: Secondary | ICD-10-CM | POA: Insufficient documentation

## 2021-04-15 DIAGNOSIS — Z170 Estrogen receptor positive status [ER+]: Secondary | ICD-10-CM | POA: Insufficient documentation

## 2021-04-15 DIAGNOSIS — C50411 Malignant neoplasm of upper-outer quadrant of right female breast: Secondary | ICD-10-CM | POA: Insufficient documentation

## 2021-04-15 DIAGNOSIS — Z923 Personal history of irradiation: Secondary | ICD-10-CM | POA: Insufficient documentation

## 2021-04-15 NOTE — Research (Signed)
WF [id number] Understanding and Predicting Breast Cancer Events after Treatment (UPBEAT)  04/15/21 - 12 Month Research Visit  Questionnaires:  The patient has completed the required questionnaires (12 Month Self-Administered Questionnaire, KCCQ-12 Questionnaire, COVID-19 Questionnaire) online prior to visit on 04/13/2021.  Labs:  Research labs were collected by phlebotomist, Adline Potter, at 12:55pm.  Patient states she has fasted for at least 3 hours prior to lab collection.  Medication Review:  The patient reviewed her medication list with this clinical research coordinator for accuracy and updated as necessary.  CV Events: The patient denies visit to the ER since last research visit.  Patient also denies MI, PCI, CABG, catheterization, stroke, or heart failure diagnosis since last research visit.  Neurocognitive Testing: Neurocognitive testing was performed per protocol by this clinical research coordinator.    6-Minute Walk, Disability Measures, Expanded SPPB: 6-minute walk, disability measures, and expanded SPPB were performed per protocol by this clinical research coordinator.    Vitals: Vitals including height, weight, blood pressure and pulse were collected per protocol and recorded in Epic.  Blood pressure was checked two times after 5 minutes of rest with readings separated by 1 minute.  Waist was measured to be 38.5 inches.  BMI was calculated to be 31.5.  Gift Card: The patient was given a $25 [employer] gift card for completion of today's research assessments.  Plan:  Patient was made aware that next research visit would be due in 1 year.  Patient verbalized understanding and denied questions at this time.  The patient was thanked for her ongoing participation in this research study.   Clabe Seal Clinical Research Coordinator I  04/15/21 3:11 PM

## 2021-04-15 NOTE — Progress Notes (Signed)
Radiation Oncology Follow up Note  Name: Colleen Richard   Date:   04/15/2021 MRN:  [id number] DOB: 05/31/1966    This 55 y.o. 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  REFERRING PROVIDER: Ronnell Freshwater, NP  HPI: Patient is a 55 year old female now out 1 year having completed whole breast radiation to her right breast for stage Ia invasive mammary carcinoma.  Seen today in routine follow-up she is doing well.  She specifically denies breast tenderness cough or bone pain..  She is currently on Arimidex tolerating that well without side effect.  She had mammograms back in February which I have reviewed were BI-RADS 2 benign  COMPLICATIONS OF TREATMENT: none  FOLLOW UP COMPLIANCE: keeps appointments   PHYSICAL EXAM:  BP 130/84 (BP Location: Right Arm, Patient Position: Sitting, Cuff Size: Large)   Pulse 88   Temp (!) 97.3 F (36.3 C)   Resp 16   Wt 213 lb 8 oz (96.8 kg)   BMI 31.53 kg/m  Lungs are clear to A&P cardiac examination essentially unremarkable with regular rate and rhythm. No dominant mass or nodularity is noted in either breast in 2 positions examined. Incision is well-healed. No axillary or supraclavicular adenopathy is appreciated. Cosmetic result is excellent.  Well-developed well-nourished patient in NAD. HEENT reveals PERLA, EOMI, discs not visualized.  Oral cavity is clear. No oral mucosal lesions are identified. Neck is clear without evidence of cervical or supraclavicular adenopathy. Lungs are clear to A&P. Cardiac examination is essentially unremarkable with regular rate and rhythm without murmur rub or thrill. Abdomen is benign with no organomegaly or masses noted. Motor sensory and DTR levels are equal and symmetric in the upper and lower extremities. Cranial nerves II through XII are grossly intact. Proprioception is intact. No  peripheral adenopathy or edema is identified. No motor or sensory levels are noted. Crude visual fields are within normal range.  RADIOLOGY RESULTS: Mammograms reviewed compatible with above-stated findings  PLAN: Present time patient continues to do well 1 year out with no evidence of disease.  On pleased with her overall progress.  I have asked to see her back in 1 year for follow-up.  Patient knows to call with any concerns.  I would like to take this opportunity to thank you for allowing me to participate in the care of your patient.Noreene Filbert, MD

## 2021-04-20 DIAGNOSIS — Z170 Estrogen receptor positive status [ER+]: Secondary | ICD-10-CM

## 2021-04-20 DIAGNOSIS — C50411 Malignant neoplasm of upper-outer quadrant of right female breast: Secondary | ICD-10-CM

## 2021-04-20 NOTE — Research (Signed)
UPBEAT WF [id number] Protocol Labs:   Dr. Grayland Ormond reviewed patients labs from her recent visit here in the clinic with HDL 242 and LDL 161 reported. Dr. Grayland Ormond request the patients primary provider be sent a copy for review and follow up. Lab results were faxed to Dr. Clayborn Bigness, MD for review and follow up with In Basket message for notification at 1245 pm today. She stated she will follow up with the patient at her next upcoming visit.   Jeral Fruit, RN 04/20/21 2:32 PM

## 2021-04-27 ENCOUNTER — Encounter: Payer: Self-pay | Admitting: Nurse Practitioner

## 2021-04-27 ENCOUNTER — Ambulatory Visit: Payer: 59 | Admitting: Nurse Practitioner

## 2021-04-27 ENCOUNTER — Other Ambulatory Visit: Payer: Self-pay

## 2021-04-27 VITALS — BP 132/86 | HR 88 | Temp 98.8°F | Resp 16 | Ht 69.0 in | Wt 213.6 lb

## 2021-04-27 DIAGNOSIS — J4520 Mild intermittent asthma, uncomplicated: Secondary | ICD-10-CM | POA: Diagnosis not present

## 2021-04-27 DIAGNOSIS — C50411 Malignant neoplasm of upper-outer quadrant of right female breast: Secondary | ICD-10-CM

## 2021-04-27 DIAGNOSIS — E2839 Other primary ovarian failure: Secondary | ICD-10-CM

## 2021-04-27 DIAGNOSIS — F5101 Primary insomnia: Secondary | ICD-10-CM | POA: Diagnosis not present

## 2021-04-27 DIAGNOSIS — Z170 Estrogen receptor positive status [ER+]: Secondary | ICD-10-CM

## 2021-04-27 MED ORDER — ALBUTEROL SULFATE HFA 108 (90 BASE) MCG/ACT IN AERS: 2.0000 | INHALATION_SPRAY | Freq: Four times a day (QID) | RESPIRATORY_TRACT | 3 refills | Status: DC | PRN

## 2021-04-27 MED ORDER — ZOLPIDEM TARTRATE 10 MG PO TABS
10.0000 mg | ORAL_TABLET | Freq: Every evening | ORAL | 1 refills | Status: DC | PRN
Start: 2021-04-27 — End: 2021-11-02

## 2021-04-27 NOTE — Progress Notes (Signed)
[facility] [place], [place] [address]  Internal MEDICINE  Office Visit Note  Patient Name: Colleen Richard  [id number]  [id number]  Date of Service: 04/27/2021  Chief Complaint  Patient presents with   Follow-up    Med refill     HPI [place] presents for a follow up visit for refills. She has a history of asthma, insomnia, and breast cancer. She has been treated for breast cancer and is currently on her maintenance anastrozole.  She is requesting refills of ambien and her rescue inhaler. ORS is 160. BMD ??   Current Medication: Outpatient Encounter Medications as of 04/27/2021  Medication Sig   anastrozole (ARIMIDEX) 1 MG tablet Take 1 tablet (1 mg total) by mouth daily.   aspirin-acetaminophen-caffeine (EXCEDRIN MIGRAINE) 250-250-65 MG tablet Take 2 tablets by mouth every 6 (six) hours as needed for headache.   CALCIUM CARBONATE-VITAMIN D PO Take 2 tablets by mouth.   ipratropium-albuterol (DUONEB) 0.5-2.5 (3) MG/3ML SOLN Take 3 mLs by nebulization every 6 (six) hours as needed.   [DISCONTINUED] albuterol (VENTOLIN HFA) 108 (90 Base) MCG/ACT inhaler Inhale 2 puffs into the lungs every 6 (six) hours as needed for wheezing or shortness of breath.   albuterol (VENTOLIN HFA) 108 (90 Base) MCG/ACT inhaler Inhale 2 puffs into the lungs every 6 (six) hours as needed for wheezing or shortness of breath.   zolpidem (AMBIEN) 10 MG tablet Take 1 tablet (10 mg total) by mouth at bedtime as needed for sleep.   [DISCONTINUED] zolpidem (AMBIEN) 10 MG tablet Take 1 tablet (10 mg total) by mouth at bedtime as needed for sleep.   No facility-administered encounter medications on file as of 04/27/2021.    Surgical History: Past Surgical History:  Procedure Laterality Date   ABDOMINAL HYSTERECTOMY  2004   BREAST BIOPSY Right 08/26/2015   stereo, top hat clip    BREAST BIOPSY Right 12/10/2019   affirm, x [facility]   BREAST EXCISIONAL BIOPSY Bilateral 2010   neg   BREAST  LUMPECTOMY Right 12/25/2019   [facility], low grade DCIS, Negative LNs   BREAST LUMPECTOMY WITH RADIOFREQUENCY TAG IDENTIFICATION Right 12/25/2019   Procedure: BREAST LUMPECTOMY WITH RADIOFREQUENCY TAG IDENTIFICATION;  Surgeon: Ronny Bacon, MD;  Location: [facility] ORS;  Service: General;  Laterality: Right;   CATARACT EXTRACTION, BILATERAL  01/2013, 2015   COLONOSCOPY WITH PROPOFOL N/A 11/03/2017   Procedure: COLONOSCOPY WITH PROPOFOL;  Surgeon: Virgel Manifold, MD;  Location: [facility] ENDOSCOPY;  Service: Endoscopy;  Laterality: N/A;   EYE SURGERY  2018   cataract and retinal detachment   hole in macular and detached retina right eye  05/31/2017   PARTIAL HYSTERECTOMY  2004   SENTINEL NODE BIOPSY Right 12/25/2019   Procedure: SENTINEL NODE BIOPSY;  Surgeon: Ronny Bacon, MD;  Location: [facility] ORS;  Service: General;  Laterality: Right;   TUBAL LIGATION      Medical History: Past Medical History:  Diagnosis Date   Abnormal mammogram    Asthma    Cancer ([place]) 12/2019   breast cancer on right   Menorrhagia    Migraines    Multiple thyroid nodules 12/2019   biopsied in past. now just following with yearly ultrasounds   Personal history of radiation therapy 2021   right breast ca   Reflux esophagitis     Family History: Family History  Problem Relation Age of Onset   COPD Mother    Asthma Mother    Atrial fibrillation Mother    Heart  disease Father    Alzheimer's disease Maternal Grandmother    Alzheimer's disease Maternal Aunt    Breast cancer Neg Hx     Social History   Socioeconomic History   Marital status: Married    Spouse name: Linwood   Number of children: Not on file   Years of education: Not on file   Highest education level: Not on file  Occupational History   Not on file  Tobacco Use   Smoking status: Never   Smokeless tobacco: Never  Vaping Use   Vaping Use: Never used  Substance and Sexual Activity   Alcohol use: No   Drug use: No   Sexual activity: Yes   Other Topics Concern   Not on file  Social History Narrative   Patient lives husband in her home.   Social Determinants of Health   Financial Resource Strain: Not on file  Food Insecurity: Not on file  Transportation Needs: Not on file  Physical Activity: Not on file  Stress: Not on file  Social Connections: Not on file  Intimate Partner Violence: Not on file      Review of Systems  Constitutional:  Negative for chills, fatigue and unexpected weight change.  HENT:  Negative for congestion, rhinorrhea, sneezing and sore throat.   Eyes:  Negative for redness.  Respiratory:  Negative for cough, chest tightness and shortness of breath.   Cardiovascular:  Negative for chest pain and palpitations.  Gastrointestinal:  Negative for abdominal pain, constipation, diarrhea, nausea and vomiting.  Genitourinary:  Negative for dysuria and frequency.  Musculoskeletal:  Negative for arthralgias, back pain, joint swelling and neck pain.  Skin:  Negative for rash.  Neurological: Negative.  Negative for tremors and numbness.  Hematological:  Negative for adenopathy. Does not bruise/bleed easily.  Psychiatric/Behavioral:  Positive for sleep disturbance. Negative for behavioral problems (Depression), self-injury and suicidal ideas. The patient is not nervous/anxious.    Vital Signs: BP 132/86   Pulse 88   Temp 98.8 F (37.1 C)   Resp 16   Ht '5\' 9"'$  (1.753 m)   Wt 213 lb 9.6 oz (96.9 kg)   SpO2 96%   BMI 31.54 kg/m    Physical Exam Vitals reviewed.  Constitutional:      General: She is not in acute distress.    Appearance: Normal appearance. She is obese. She is not ill-appearing.  HENT:     Head: Normocephalic and atraumatic.  Cardiovascular:     Rate and Rhythm: Normal rate and regular rhythm.     Pulses: Normal pulses.     Heart sounds: Normal heart sounds. No murmur heard.   No friction rub. No gallop.  Pulmonary:     Effort: Pulmonary effort is normal. No respiratory  distress.     Breath sounds: Normal breath sounds. No wheezing.  Skin:    General: Skin is warm and dry.     Capillary Refill: Capillary refill takes less than 2 seconds.  Neurological:     Mental Status: She is alert and oriented to person, place, and time.  Psychiatric:        Mood and Affect: Mood normal.        Behavior: Behavior normal.    Assessment/Plan: 1. Primary insomnia Stable with ambien, refill ordered. ORS is 160 on PDMP.  - zolpidem (AMBIEN) 10 MG tablet; Take 1 tablet (10 mg total) by mouth at bedtime as needed for sleep.  Dispense: 30 tablet; Refill: 1  2. Mild intermittent asthma  without complication Stable, needs refill of rescue inhaler which she uses occasionally.  - albuterol (VENTOLIN HFA) 108 (90 Base) MCG/ACT inhaler; Inhale 2 puffs into the lungs every 6 (six) hours as needed for wheezing or shortness of breath.  Dispense: 18 g; Refill: 3  3. Malignant neoplasm of upper-outer quadrant of right breast in female, estrogen receptor positive ([place]) Stable, follow by oncology, taking anastrozole.   4. Other primary ovarian failure - DG Bone Density; Future   General Counseling: fredia bemus understanding of the findings of todays visit and agrees with plan of treatment. I have discussed any further diagnostic evaluation that may be needed or ordered today. We also reviewed her medications today. she has been encouraged to call the office with any questions or concerns that should arise related to todays visit.    No orders of the defined types were placed in this encounter.   Meds ordered this encounter  Medications   zolpidem (AMBIEN) 10 MG tablet    Sig: Take 1 tablet (10 mg total) by mouth at bedtime as needed for sleep.    Dispense:  30 tablet    Refill:  1   albuterol (VENTOLIN HFA) 108 (90 Base) MCG/ACT inhaler    Sig: Inhale 2 puffs into the lungs every 6 (six) hours as needed for wheezing or shortness of breath.    Dispense:  18 g    Refill:   3    Return in about 6 months (around 10/28/2021) for CPE, Greydis Stlouis PCP.   Total time spent:30 Minutes Time spent includes review of chart, medications, test results, and follow up plan with the patient.   [place] Controlled Substance Database was reviewed by me.  This patient was seen by Jonetta Osgood, FNP-C in collaboration with Dr. Clayborn Bigness as a part of collaborative care agreement.   Marcele Kosta R. Valetta Fuller, MSN, FNP-C Internal medicine

## 2021-05-06 ENCOUNTER — Telehealth: Payer: Self-pay

## 2021-05-06 NOTE — Telephone Encounter (Signed)
Thanks

## 2021-05-06 NOTE — Telephone Encounter (Signed)
Patient has been scheduled

## 2021-05-06 NOTE — Telephone Encounter (Signed)
I spoke with patient. She stated bone density has not been ordered by [facility]. I told her Colleen Richard had put order in for her. She will call [facility] to schedule-Toni

## 2021-05-11 ENCOUNTER — Encounter: Payer: Self-pay | Admitting: Oncology

## 2021-05-18 ENCOUNTER — Ambulatory Visit
Admission: RE | Admit: 2021-05-18 | Discharge: 2021-05-18 | Disposition: A | Discharge status: Home / Self Care | Payer: 59 | Source: Ambulatory Visit | Attending: Internal Medicine | Admitting: Internal Medicine

## 2021-05-18 ENCOUNTER — Other Ambulatory Visit: Payer: Self-pay

## 2021-05-18 DIAGNOSIS — E2839 Other primary ovarian failure: Secondary | ICD-10-CM | POA: Insufficient documentation

## 2021-05-29 NOTE — Progress Notes (Signed)
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  INTERVAL HISTORY: Patient returns to clinic today for routine 62-month evaluation.  She continues to have hot flashes with anastrozole, but no longer complains of any joint or bone pain.  She has no neurologic complaints.  She denies any recent fevers or illnesses.  She has a good appetite and denies weight loss.  She has no chest pain, shortness of breath, cough, or hemoptysis. She denies any nausea, vomiting, constipation, or diarrhea.  She has no urinary complaints.  Patient offers no further specific complaints today.  REVIEW OF SYSTEMS:   Review of Systems  Constitutional: Negative.  Negative for diaphoresis, fever, malaise/fatigue and weight loss.  Respiratory: Negative.  Negative for cough, hemoptysis and shortness of breath.   Cardiovascular: Negative.  Negative for chest pain and leg swelling.  Gastrointestinal: Negative.  Negative for abdominal pain.  Genitourinary: Negative.  Negative for dysuria.  Musculoskeletal: Negative.  Negative for back pain and myalgias.  Skin: Negative.  Negative for rash.  Neurological:  Positive for sensory change. Negative for dizziness, focal weakness, weakness and headaches.  Psychiatric/Behavioral: Negative.  The patient is not nervous/anxious.    As per HPI. Otherwise, a complete review of systems is negative.  PAST MEDICAL HISTORY: Past Medical History:  Diagnosis Date   Abnormal mammogram    Asthma    Cancer ([place])  12/2019   breast cancer on right   Menorrhagia    Migraines    Multiple thyroid nodules 12/2019   biopsied in past. 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   BREAST LUMPECTOMY WITH RADIOFREQUENCY TAG IDENTIFICATION Right 12/25/2019   Procedure: BREAST LUMPECTOMY WITH RADIOFREQUENCY TAG IDENTIFICATION;  Surgeon: Ronny Bacon, MD;  Location: [facility];  Service: General;  Laterality: Right;   CATARACT EXTRACTION, BILATERAL  01/2013, 2015   COLONOSCOPY WITH PROPOFOL N/A 11/03/2017   Procedure: COLONOSCOPY WITH PROPOFOL;  Surgeon: Virgel Manifold, MD;  Location: [facility];  Service: Endoscopy;  Laterality: N/A;   EYE SURGERY  2018   cataract and retinal detachment   hole in macular and detached retina right eye  05/31/2017   PARTIAL HYSTERECTOMY  2004   SENTINEL NODE BIOPSY Right 12/25/2019   Procedure: SENTINEL NODE BIOPSY;  Surgeon: Ronny Bacon, MD;  Location: [facility];  Service: General;  Laterality: Right;   TUBAL LIGATION      FAMILY HISTORY: Family History  Problem Relation Age of Onset   COPD Mother    Asthma Mother    Atrial fibrillation Mother    Heart disease Father    Alzheimer's disease  Maternal Grandmother    Alzheimer's disease Maternal Aunt    Breast cancer Neg Hx     ADVANCED DIRECTIVES (Y/N):  N  HEALTH MAINTENANCE: Social History   Tobacco Use   Smoking status: Never   Smokeless tobacco: Never  Vaping Use   Vaping Use: Never used  Substance Use Topics   Alcohol use: No   Drug use: No      Colonoscopy:  PAP:  Bone density:  Lipid panel:  No Known Allergies  Current Outpatient Medications  Medication Sig Dispense Refill   albuterol (VENTOLIN HFA) 108 (90 Base) MCG/ACT inhaler Inhale 2 puffs into the lungs every 6 (six) hours as needed for wheezing or shortness of breath. 18 g 3   anastrozole (ARIMIDEX) 1 MG tablet Take 1 tablet (1 mg total) by mouth daily. 90 tablet 3   aspirin-acetaminophen-caffeine (EXCEDRIN MIGRAINE) 250-250-65 MG tablet Take 2 tablets by mouth every 6 (six) hours as needed for headache.     CALCIUM CARBONATE-VITAMIN D PO Take 2 tablets by mouth.     ipratropium-albuterol (DUONEB) 0.5-2.5 (3) MG/3ML SOLN Take 3 mLs by nebulization every 6 (six) hours as needed. 360 mL 0   zolpidem (AMBIEN) 10 MG tablet Take 1 tablet (10 mg total) by mouth at bedtime as needed for sleep. 30 tablet 1   No current facility-administered medications for this visit.    OBJECTIVE: Vitals:   06/03/21 1411  BP: 129/83  Pulse: 93  Resp: 18  Temp: 98.3 F (36.8 C)  SpO2: 99%     Body mass index is 30.86 kg/m.    ECOG FS:0 - Asymptomatic  General: Well-developed, well-nourished, no acute distress. Eyes: Pink conjunctiva, anicteric sclera. HEENT: Normocephalic, moist mucous membranes. Breasts: Exam deferred today. Lungs: No audible wheezing or coughing. Heart: Regular rate and rhythm. Abdomen: Soft, nontender, no obvious distention. Musculoskeletal: No edema, cyanosis, or clubbing. Neuro: Alert, answering all questions appropriately. Cranial nerves grossly intact. Skin: No rashes or petechiae noted. Psych: Normal affect.   LAB RESULTS:  Lab Results  Component Value Date   NA 141 11/06/2020   K 4.4 11/06/2020   CL 104 11/06/2020   CO2 20 11/06/2020   GLUCOSE 97 11/06/2020   BUN 23 11/06/2020   CREATININE 1.07 (H) 11/06/2020   CALCIUM 9.3 11/06/2020   PROT 6.7 11/06/2020   ALBUMIN 4.5 11/06/2020   AST 19 11/06/2020   ALT 16 11/06/2020   ALKPHOS 88  11/06/2020   BILITOT 0.5 11/06/2020   GFRNONAA 59 (L) 11/06/2020   GFRAA 68 11/06/2020    Lab Results  Component Value Date   WBC 4.3 11/06/2020   NEUTROABS 2.5 11/06/2020   HGB 13.4 11/06/2020   HCT 41.0 11/06/2020   MCV 86 11/06/2020   PLT 221 11/06/2020     STUDIES: DG Bone Density  Result Date: 05/18/2021 EXAM: DUAL X-RAY ABSORPTIOMETRY (DXA) FOR BONE MINERAL DENSITY IMPRESSION: Your patient Colleen Richard completed a BMD test on 05/18/2021 using the [employer] (software version: 14.10) manufactured by [employer]. The following summarizes the results of our evaluation. Technologist: [facility] PATIENT BIOGRAPHICAL: Name: Betsy, Rosello Patient ID: [id number] Birth Date: [birth year] Height: 68.5 in. Gender: Female Exam Date: 05/18/2021 Weight: 210.8 lbs. Indications: Caucasian, History of Breast Cancer, History of Radiation, Hysterectomy, Postmenopausal Fractures: Treatments: Albuterol, Anastrozole, Calcium, Vitamin D DENSITOMETRY RESULTS: Site         Region     Measured Date Measured Age WHO Classification Young Adult T-score BMD         %  Change vs. Previous Significant Change (*) AP Spine L1-L4 05/18/2021 54.3 Normal 0.8 1.295 g/cm2 - - DualFemur Neck Right 05/18/2021 54.3 Normal -0.4 0.989 g/cm2 -0.7% - DualFemur Neck Right 03/10/2020 53.1 Normal -0.3 0.996 g/cm2 - - DualFemur Total Mean 05/18/2021 54.3 Normal 0.3 1.047 g/cm2 -4.3% Yes DualFemur Total Mean 03/10/2020 53.1 Normal 0.7 1.094 g/cm2 - - Left Forearm Radius 33% 05/18/2021 54.3 Normal -0.1 0.864 g/cm2 -0.7% - Left Forearm Radius 33% 03/10/2020 53.1 Normal -0.1 0.870 g/cm2 - - ASSESSMENT: The BMD measured at Femur Neck Right is 0.989 g/cm2 with a T-score of -0.4. This patient is considered normal according to [facility]) criteria. The scan quality is good. Compared with prior study, there has been no significant change in the spine. Compared with prior study, there has been significant decrease  in the total hip. [facility]) criteria for post-menopausal, Caucasian Women: Normal:                   T-score at or above -1 SD Osteopenia/low bone mass: T-score between -1 and -2.5 SD Osteoporosis:             T-score at or below -2.5 SD RECOMMENDATIONS: 1. All patients should optimize calcium and vitamin D intake. 2. Consider FDA-approved medical therapies in postmenopausal women and men aged 60 years and older, based on the following: a. A hip or vertebral(clinical or morphometric) fracture b. T-score < -2.5 at the femoral neck or spine after appropriate evaluation to exclude secondary causes c. Low bone mass (T-score between -1.0 and -2.5 at the femoral neck or spine) and a 10-year probability of a hip fracture > 3% or a 10-year probability of a major osteoporosis-related fracture > 20% based on the [place]-adapted WHO algorithm 3. Clinician judgment and/or patient preferences may indicate treatment for people with 10-year fracture probabilities above or below these levels FOLLOW-UP: People with diagnosed cases of osteoporosis or at high risk for fracture should have regular bone mineral density tests. For patients eligible for Medicare, routine testing is allowed once every 2 years. The testing frequency can be increased to one year for patients who have rapidly progressing disease, those who are receiving or discontinuing medical therapy to restore bone mass, or have additional risk factors. I have reviewed this report, and agree with the above findings. Mark A. Thornton Papas, M.D. [facility] Electronically Signed   By: Lavonia Dana M.D.   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  Patient completed adjuvant XRT.  Previously, [facility] and [facility] indicated that patient is postmenopausal.  She could not tolerate letrozole and was transitioned to anastrozole.  She will take a total of 5 years of treatment completing in June 2026.  Patient's most recent mammogram in March 2022 was reported as BI-RADS 2.  Return to clinic in 6 months with video assisted telemedicine visit.   2.  Bone health: Bone mineral density on May 18, 2021 reported T score of -0.4 which is essentially unchanged from previous and continues to be within normal limits.  Repeat bone mineral density in August 2024.    I spent  a total of 20 minutes reviewing chart data, face-to-face evaluation with the patient, counseling and coordination of care as detailed above.   Patient expressed understanding and was in agreement with this plan. She also understands that She can call clinic at any time with any questions, concerns, or complaints.   Cancer Staging Malignant neoplasm of upper-outer quadrant of right female breast [facility]) Staging form: Breast, AJCC 8th Edition - Clinical stage from 01/03/2020: Stage IA (cT1b, cN0, cM0, G1, ER+, PR+, HER2-) - Signed by Lloyd Huger, MD on 01/03/2020 Stage prefix: Initial diagnosis Histologic grading system: 3 grade system   Lloyd Huger, MD   06/04/2021 5:49 PM

## 2021-06-03 ENCOUNTER — Other Ambulatory Visit: Payer: Self-pay

## 2021-06-03 ENCOUNTER — Inpatient Hospital Stay: Payer: 59 | Attending: Nurse Practitioner | Admitting: Oncology

## 2021-06-03 VITALS — BP 129/83 | HR 93 | Temp 98.3°F | Resp 18 | Wt 209.0 lb

## 2021-06-03 DIAGNOSIS — C50411 Malignant neoplasm of upper-outer quadrant of right female breast: Secondary | ICD-10-CM | POA: Diagnosis present

## 2021-06-03 DIAGNOSIS — R232 Flushing: Secondary | ICD-10-CM | POA: Diagnosis not present

## 2021-06-03 DIAGNOSIS — Z79811 Long term (current) use of aromatase inhibitors: Secondary | ICD-10-CM | POA: Insufficient documentation

## 2021-06-03 DIAGNOSIS — M810 Age-related osteoporosis without current pathological fracture: Secondary | ICD-10-CM | POA: Insufficient documentation

## 2021-06-03 DIAGNOSIS — Z79899 Other long term (current) drug therapy: Secondary | ICD-10-CM | POA: Insufficient documentation

## 2021-06-03 DIAGNOSIS — Z923 Personal history of irradiation: Secondary | ICD-10-CM | POA: Insufficient documentation

## 2021-06-03 DIAGNOSIS — Z170 Estrogen receptor positive status [ER+]: Secondary | ICD-10-CM | POA: Insufficient documentation

## 2021-06-03 NOTE — Progress Notes (Signed)
Pt has questions about bone density scan results but has no other concerns/complaints at this time.

## 2021-06-04 ENCOUNTER — Ambulatory Visit: Payer: Managed Care, Other (non HMO) | Admitting: Nurse Practitioner

## 2021-06-07 ENCOUNTER — Encounter: Payer: Self-pay | Admitting: Oncology

## 2021-07-15 IMAGING — MG DIGITAL DIAGNOSTIC BILAT W/ TOMO W/ CAD
6 of 12 series · 6 of 36 positions shown · non-contrast
Comparison: Previous exam(s).

CLINICAL DATA: 53-year-old female for further evaluation of
possible RIGHT breast distortion of possible LEFT breast mass on
screening mammogram.

EXAM:
DIGITAL DIAGNOSTIC BILATERAL MAMMOGRAM WITH CAD AND TOMO
ULTRASOUND BILATERAL BREAST

[R ML synth-2D]
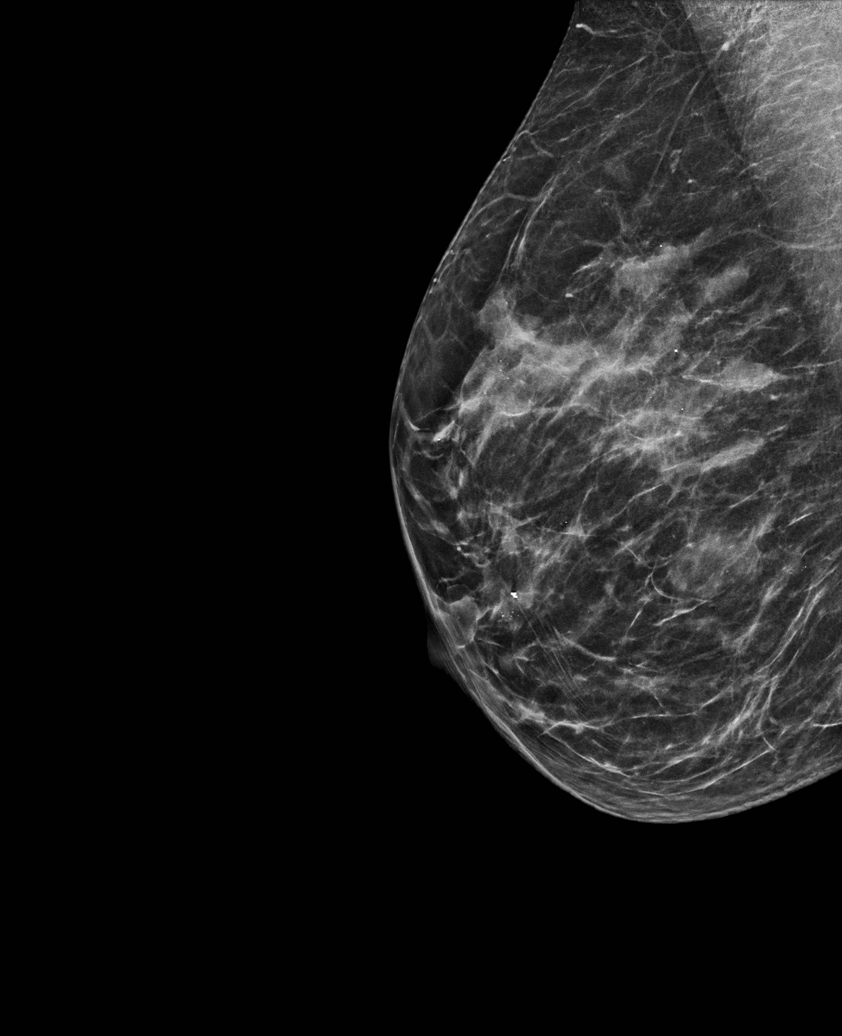

[L MLO synth-2D]
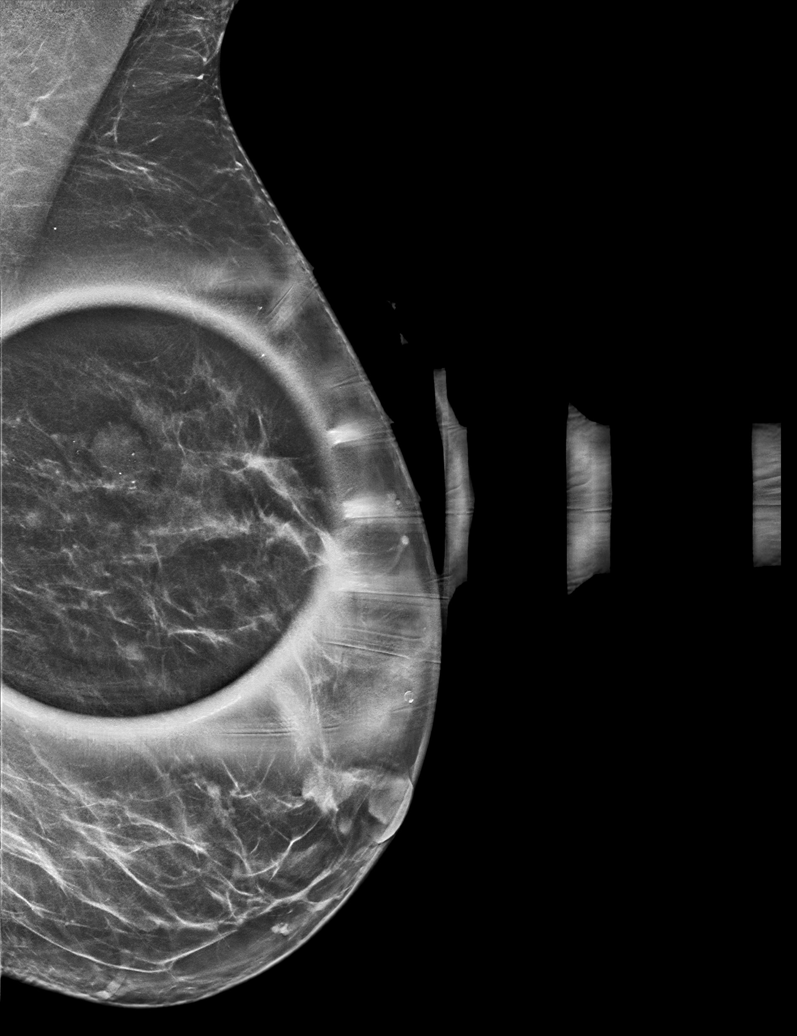

[R CC synth-2D (1 of 3)]
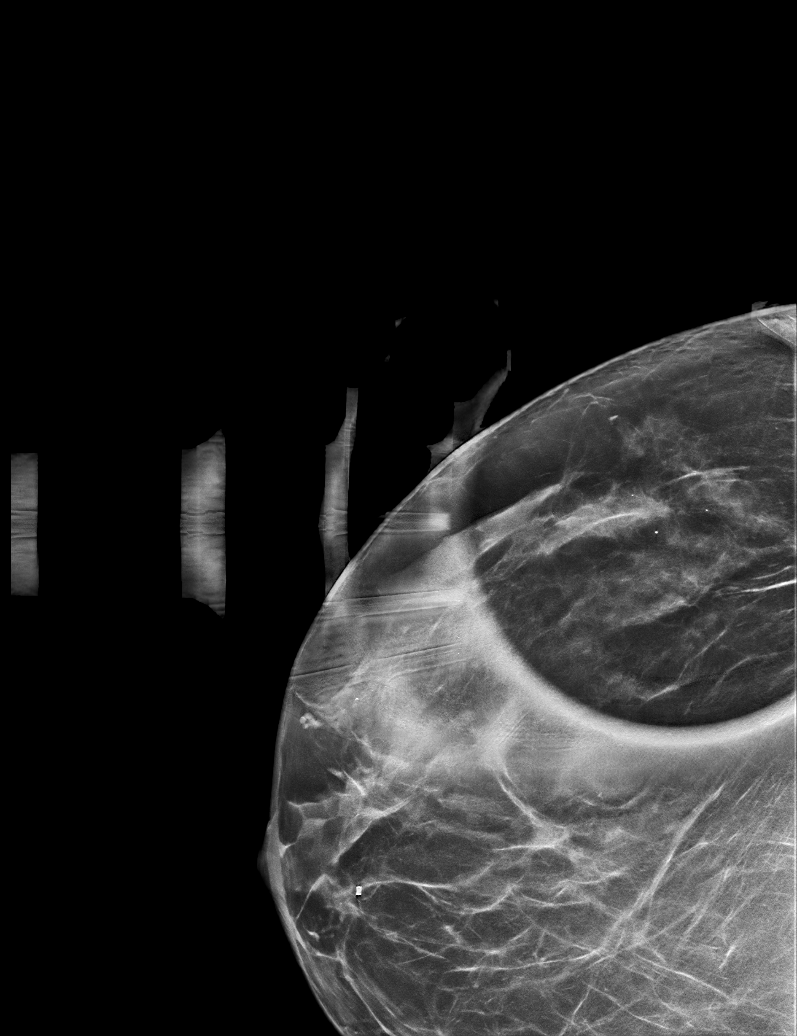

[L CC synth-2D]
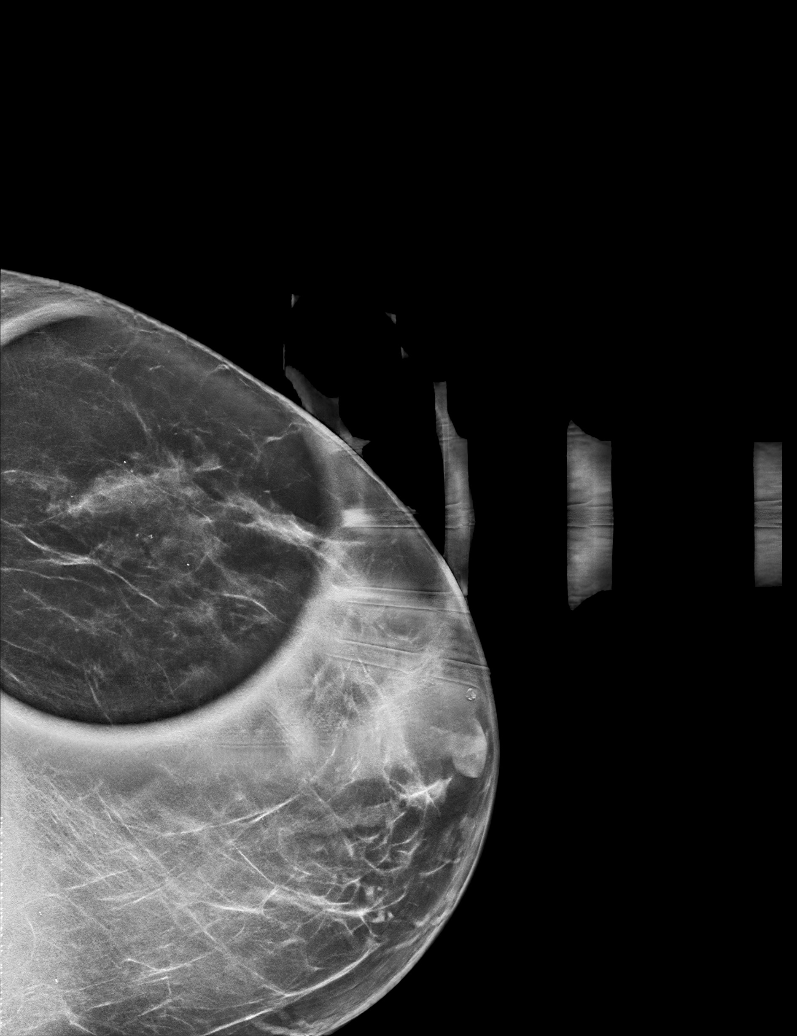

[R CC synth-2D (2 of 3)]
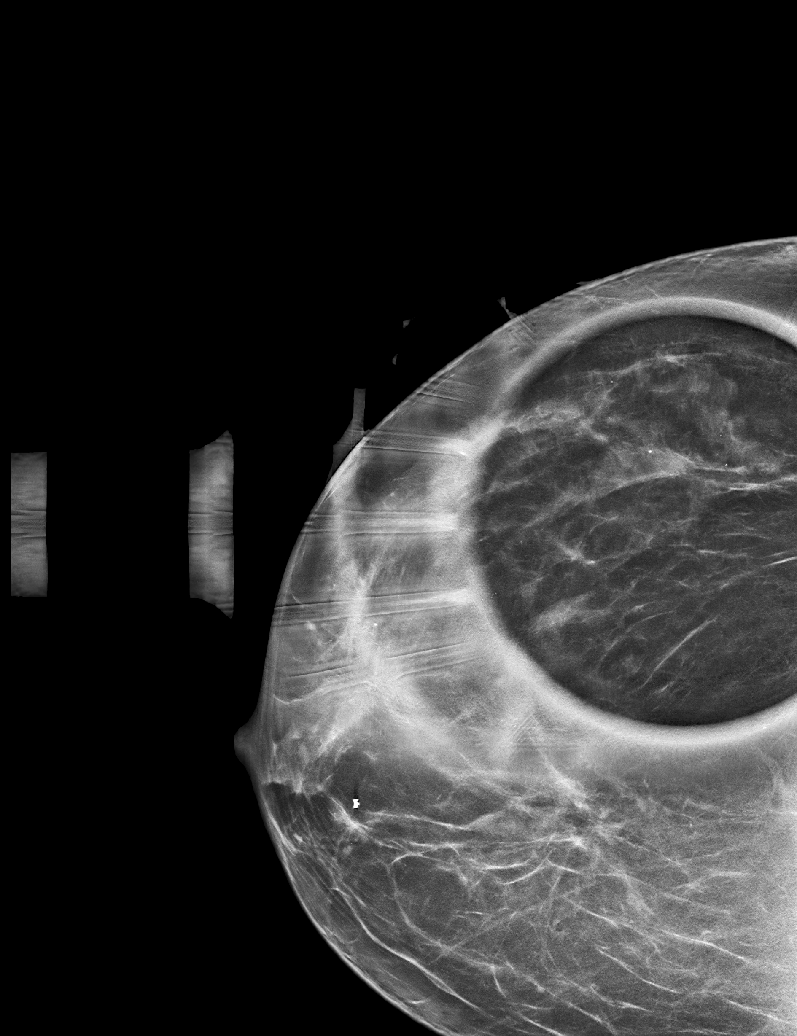

[R CC synth-2D (3 of 3)]
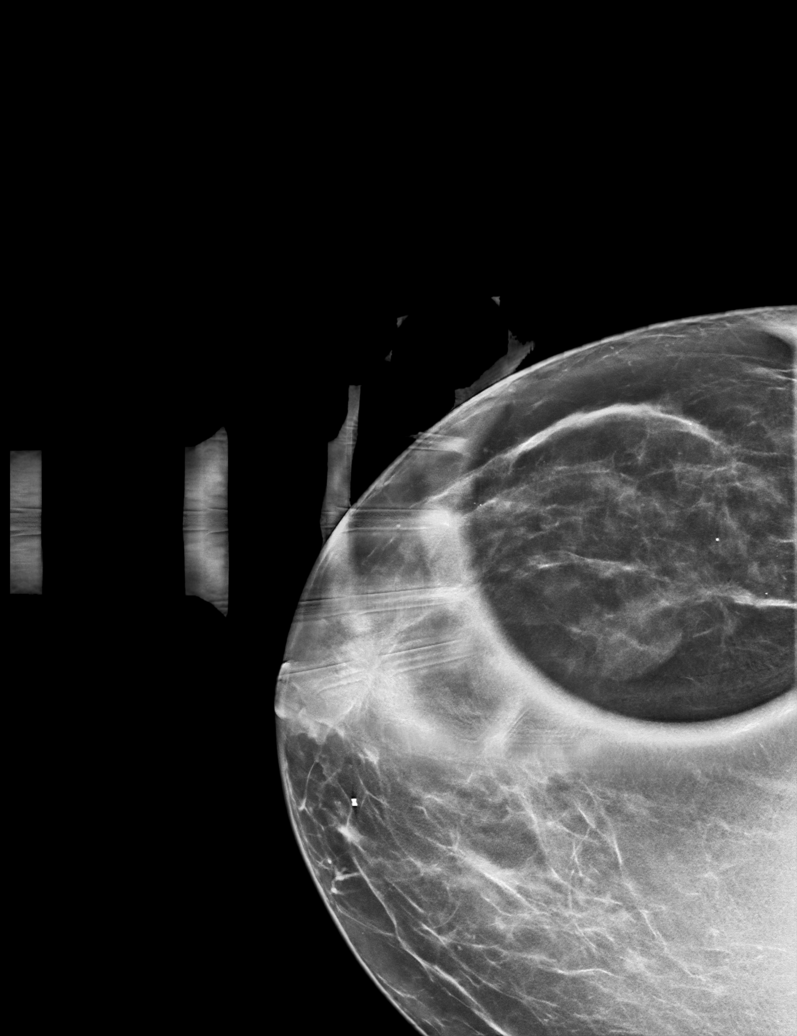

[6 of 36 positions shown; findings below may reference images not displayed]

ACR Breast Density Category c: The breast tissue is heterogeneously
dense, which may obscure small masses.
FINDINGS: 2D/3D full field and spot compression views of the RIGHT breast and
spot compression views of the LEFT breast are performed.

There is a persistent 0.6 cm irregular mass within the UPPER OUTER
RIGHT breast on multiple spot compression views.

A persistent 1.3 cm circumscribed oval mass is identified within the
UPPER-OUTER LEFT breast.

Mammographic images were processed with CAD.

Targeted ultrasound is performed, showing a 0.5 x 0.6 x 0.5 cm
irregular hypoechoic shadowing mass at the [DATE] position of the
RIGHT breast 8 cm from the nipple corresponding to the mammographic
mass.

No abnormal RIGHT axillary lymph nodes are identified.

A 1 x 0.6 x 1.3 cm benign simple cyst at the [DATE] position of the
LEFT breast 7 cm from the nipple is identified, corresponding to the
LEFT breast screening study finding.
IMPRESSION: 1. 0.6 cm suspicious mass within the UPPER-OUTER RIGHT breast.
Tissue sampling is recommended.
2. No abnormal RIGHT axillary lymph nodes.
3. Benign cyst within the UPPER-OUTER LEFT breast, corresponding to
the LEFT breast screening study finding.

RECOMMENDATION:
Ultrasound-guided RIGHT breast biopsy, which will be arranged.

I have discussed the findings and recommendations with the patient.
If applicable, a reminder letter will be sent to the patient
regarding the next appointment.

BI-RADS CATEGORY  4: Suspicious.

## 2021-07-15 IMAGING — US US BREAST*L* LIMITED INC AXILLA
1 series · 7 of 7 positions shown · non-contrast
Comparison: Previous exam(s).

CLINICAL DATA: 53-year-old female for further evaluation of
possible RIGHT breast distortion of possible LEFT breast mass on
screening mammogram.

EXAM:
DIGITAL DIAGNOSTIC BILATERAL MAMMOGRAM WITH CAD AND TOMO
ULTRASOUND BILATERAL BREAST

[Series 1: us breast*left* limited inc axilla · 0.06mm/px · 7 of 7 slices shown]
[im 1/7]
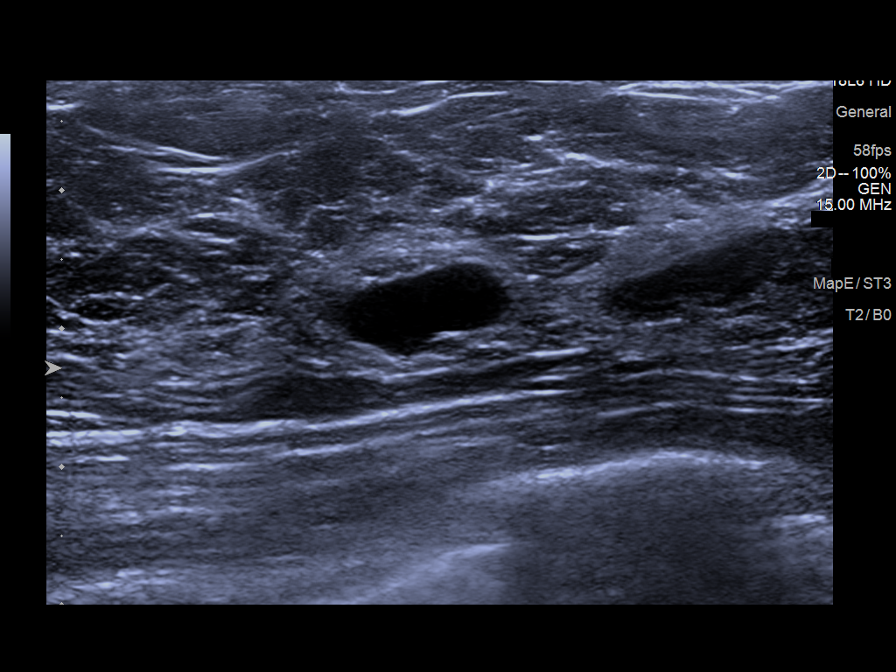
[im 2/7]
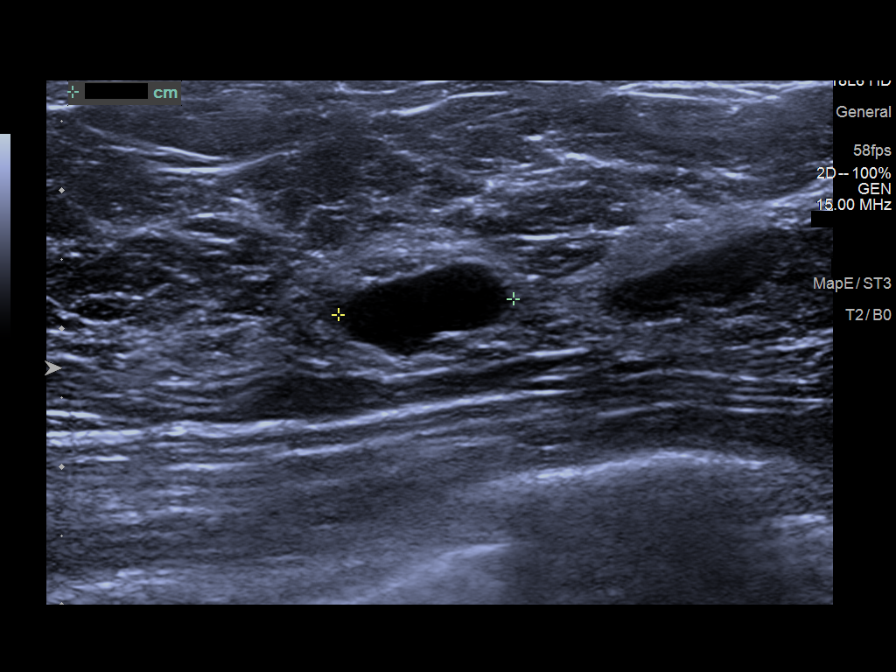
[im 3/7]
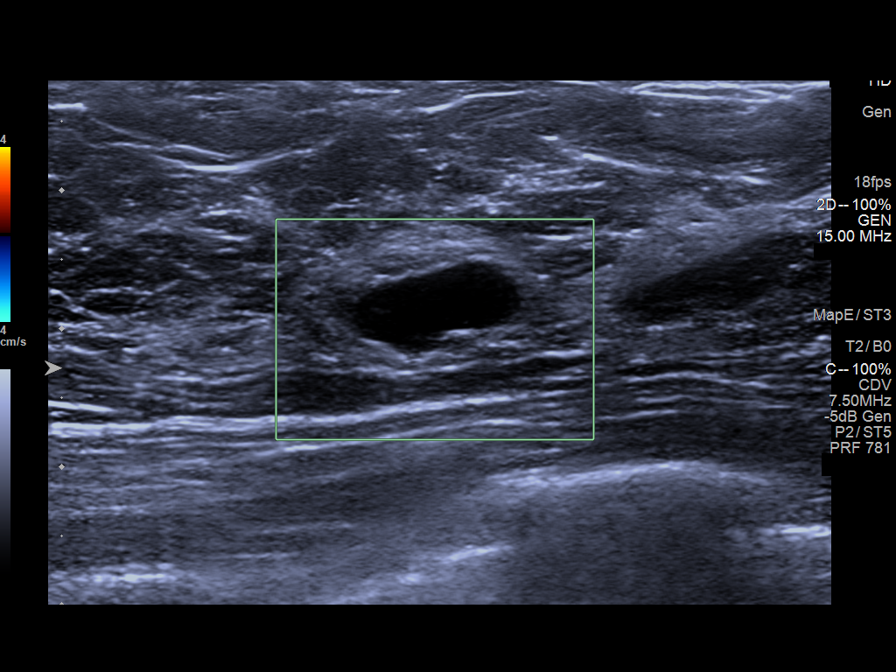
[im 4/7]
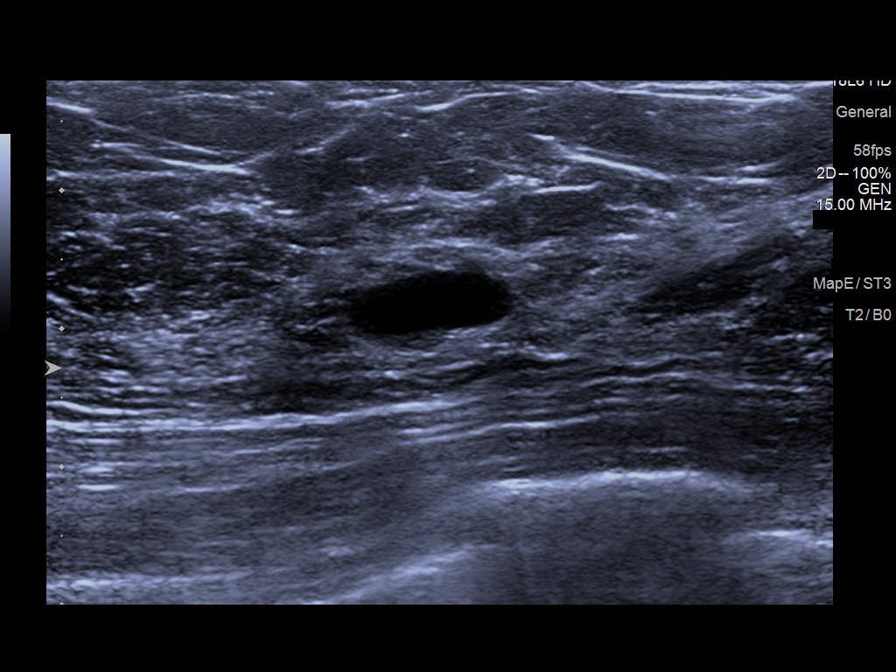
[im 5/7]
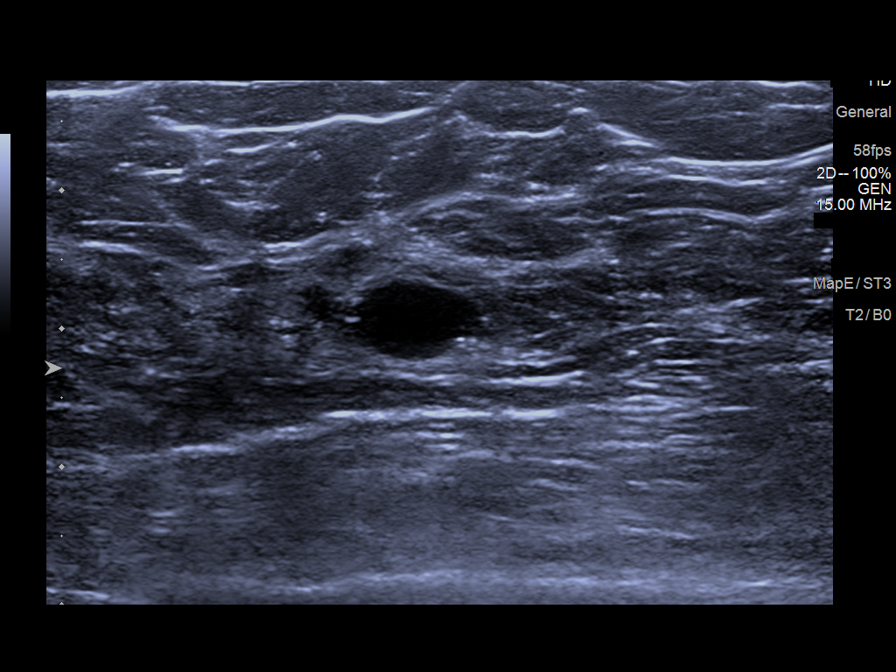
[im 6/7]
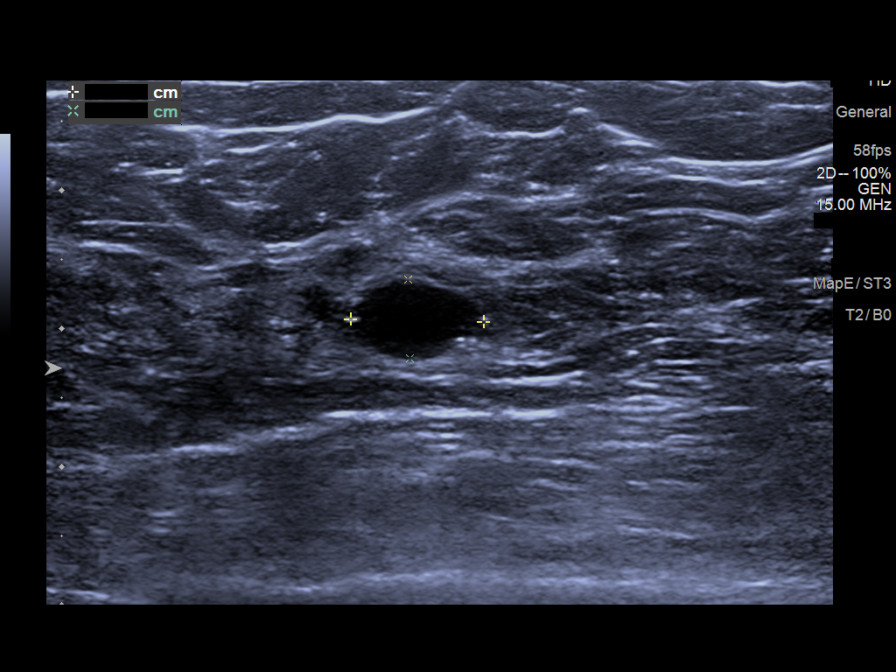
[im 7/7]
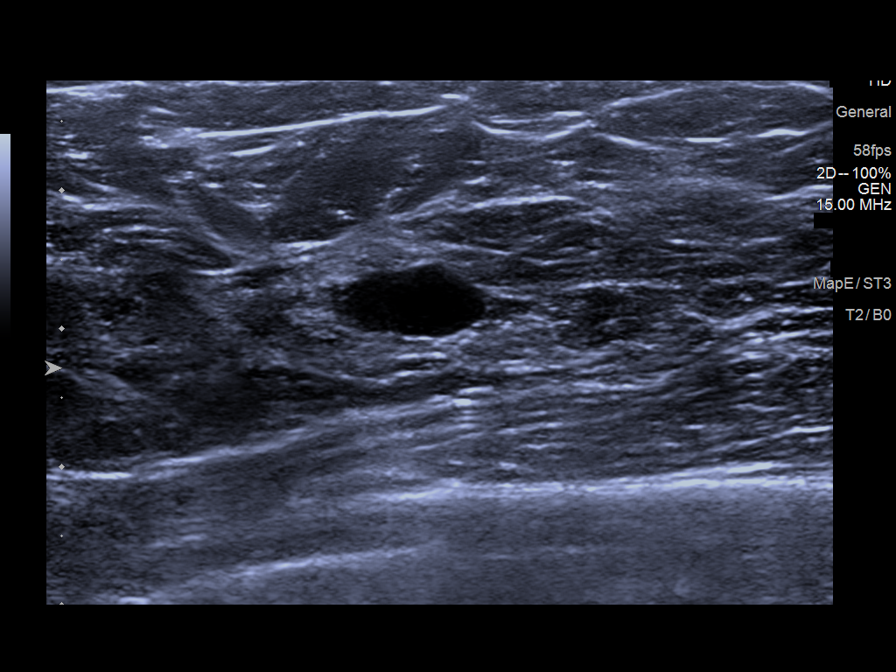

[7 of 7 positions shown; findings below may reference images not displayed]

ACR Breast Density Category c: The breast tissue is heterogeneously
dense, which may obscure small masses.
FINDINGS: 2D/3D full field and spot compression views of the RIGHT breast and
spot compression views of the LEFT breast are performed.

There is a persistent 0.6 cm irregular mass within the UPPER OUTER
RIGHT breast on multiple spot compression views.

A persistent 1.3 cm circumscribed oval mass is identified within the
UPPER-OUTER LEFT breast.

Mammographic images were processed with CAD.

Targeted ultrasound is performed, showing a 0.5 x 0.6 x 0.5 cm
irregular hypoechoic shadowing mass at the [DATE] position of the
RIGHT breast 8 cm from the nipple corresponding to the mammographic
mass.

No abnormal RIGHT axillary lymph nodes are identified.

A 1 x 0.6 x 1.3 cm benign simple cyst at the [DATE] position of the
LEFT breast 7 cm from the nipple is identified, corresponding to the
LEFT breast screening study finding.
IMPRESSION: 1. 0.6 cm suspicious mass within the UPPER-OUTER RIGHT breast.
Tissue sampling is recommended.
2. No abnormal RIGHT axillary lymph nodes.
3. Benign cyst within the UPPER-OUTER LEFT breast, corresponding to
the LEFT breast screening study finding.

RECOMMENDATION:
Ultrasound-guided RIGHT breast biopsy, which will be arranged.

I have discussed the findings and recommendations with the patient.
If applicable, a reminder letter will be sent to the patient
regarding the next appointment.

BI-RADS CATEGORY  4: Suspicious.

## 2021-07-15 IMAGING — US US BREAST*R* LIMITED INC AXILLA
1 series · 10 of 10 positions shown · non-contrast
Comparison: Previous exam(s).

CLINICAL DATA: 53-year-old female for further evaluation of
possible RIGHT breast distortion of possible LEFT breast mass on
screening mammogram.

EXAM:
DIGITAL DIAGNOSTIC BILATERAL MAMMOGRAM WITH CAD AND TOMO
ULTRASOUND BILATERAL BREAST

[Series 1: us breast*right* limited inc axilla · 0.06mm/px · 10 of 10 slices shown]
[im 1/10]
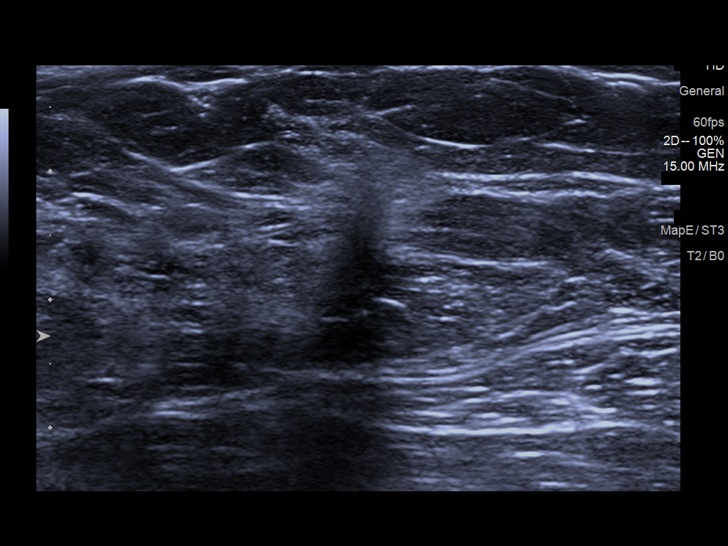
[im 2/10]
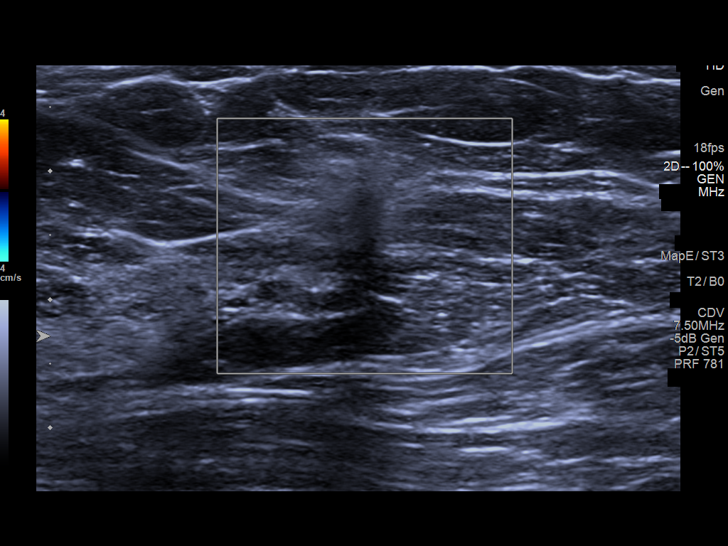
[im 3/10]
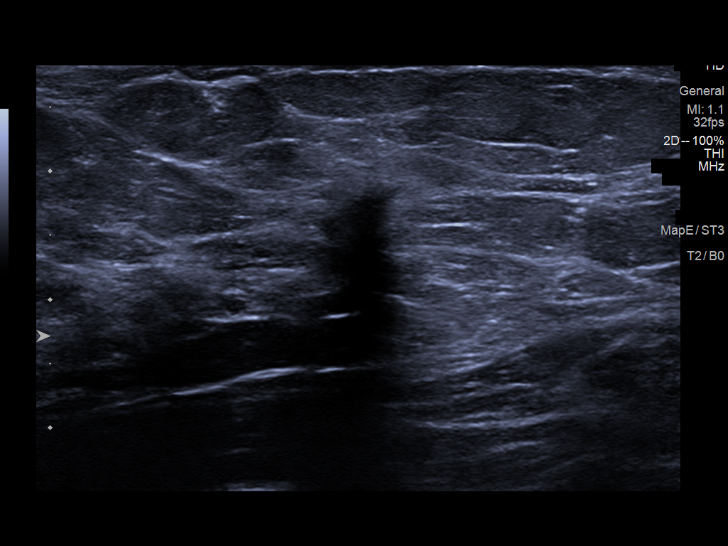
[im 4/10]
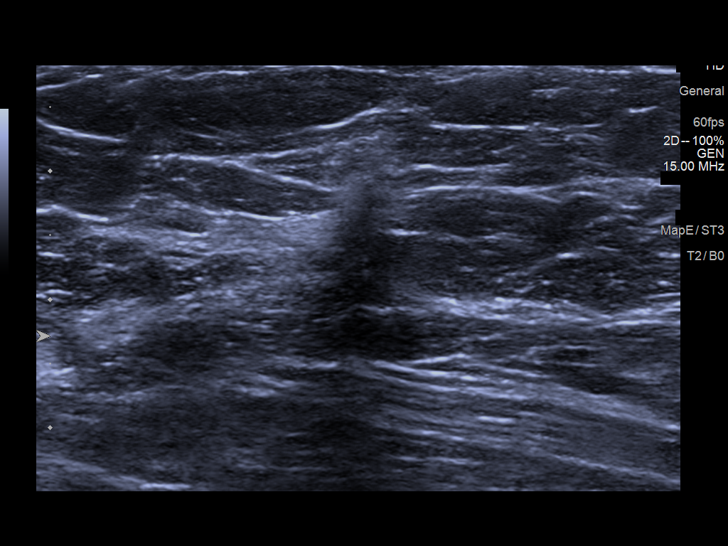
[im 5/10]
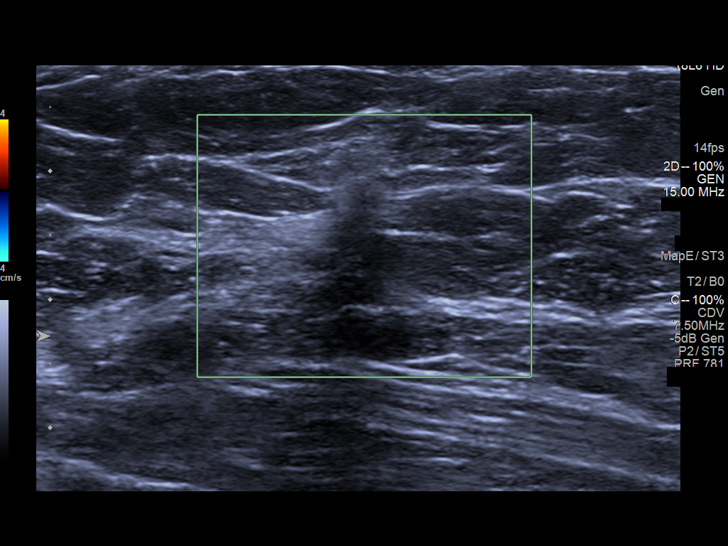
[im 6/10]
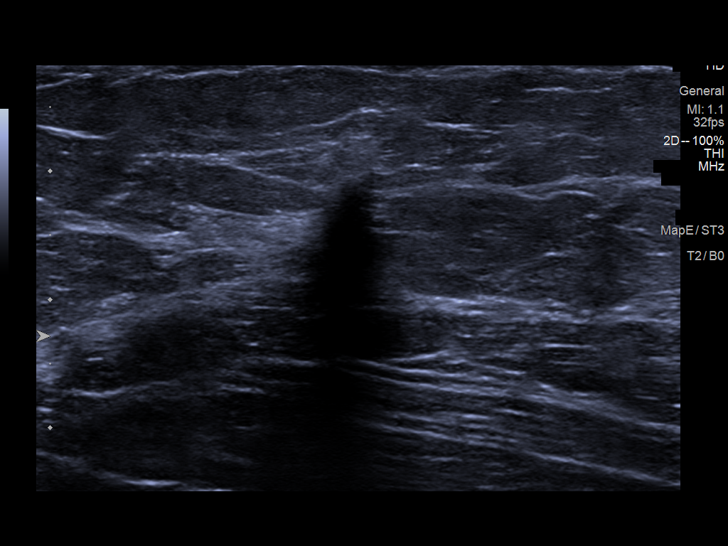
[im 7/10]
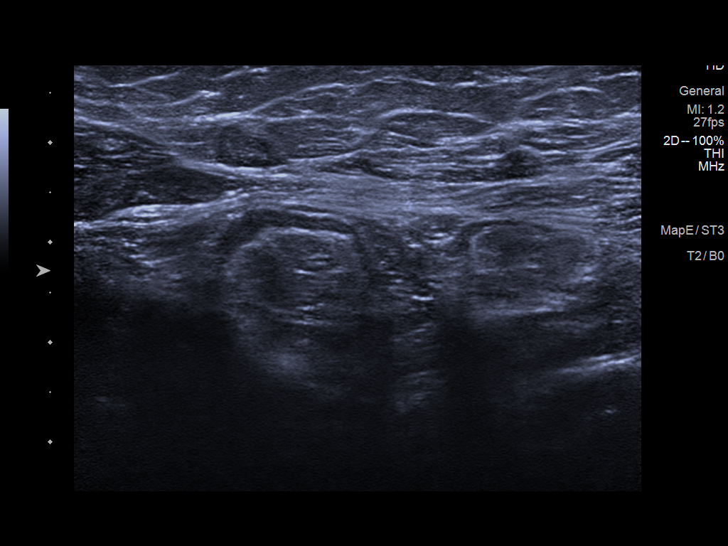
[im 8/10]
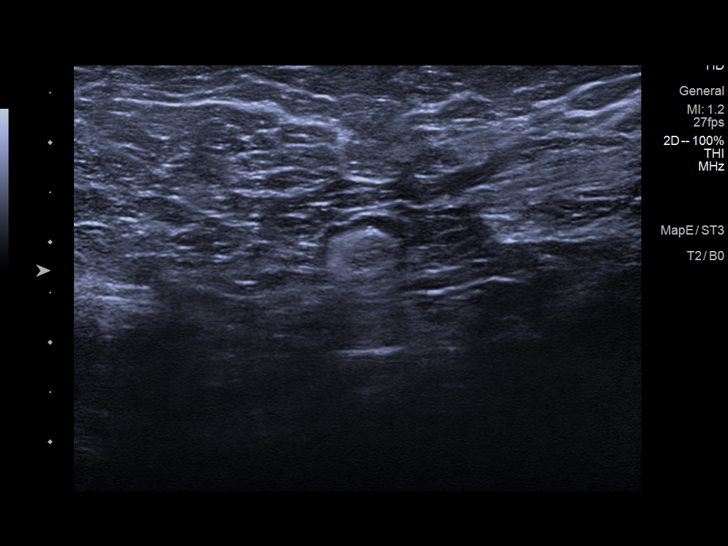
[im 9/10]
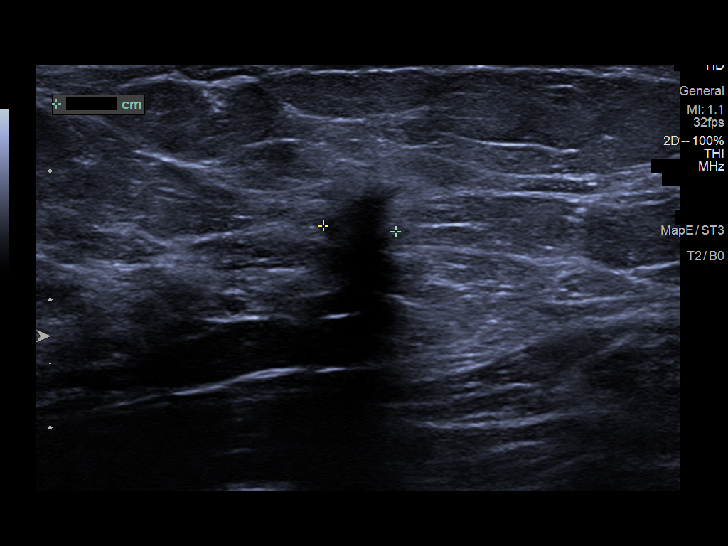
[im 10/10]
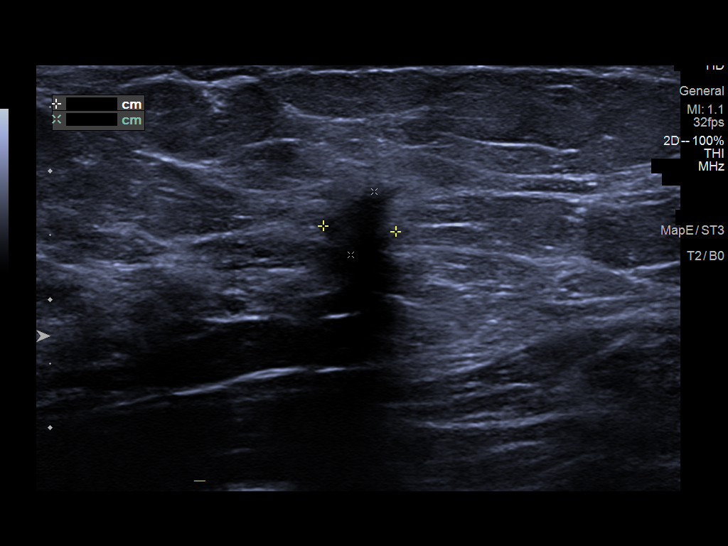

[10 of 10 positions shown; findings below may reference images not displayed]

ACR Breast Density Category c: The breast tissue is heterogeneously
dense, which may obscure small masses.
FINDINGS: 2D/3D full field and spot compression views of the RIGHT breast and
spot compression views of the LEFT breast are performed.

There is a persistent 0.6 cm irregular mass within the UPPER OUTER
RIGHT breast on multiple spot compression views.

A persistent 1.3 cm circumscribed oval mass is identified within the
UPPER-OUTER LEFT breast.

Mammographic images were processed with CAD.

Targeted ultrasound is performed, showing a 0.5 x 0.6 x 0.5 cm
irregular hypoechoic shadowing mass at the [DATE] position of the
RIGHT breast 8 cm from the nipple corresponding to the mammographic
mass.

No abnormal RIGHT axillary lymph nodes are identified.

A 1 x 0.6 x 1.3 cm benign simple cyst at the [DATE] position of the
LEFT breast 7 cm from the nipple is identified, corresponding to the
LEFT breast screening study finding.
IMPRESSION: 1. 0.6 cm suspicious mass within the UPPER-OUTER RIGHT breast.
Tissue sampling is recommended.
2. No abnormal RIGHT axillary lymph nodes.
3. Benign cyst within the UPPER-OUTER LEFT breast, corresponding to
the LEFT breast screening study finding.

RECOMMENDATION:
Ultrasound-guided RIGHT breast biopsy, which will be arranged.

I have discussed the findings and recommendations with the patient.
If applicable, a reminder letter will be sent to the patient
regarding the next appointment.

BI-RADS CATEGORY  4: Suspicious.

## 2021-08-04 HISTORY — PX: EYE SURGERY: SHX253

## 2021-09-29 ENCOUNTER — Encounter: Payer: Self-pay | Admitting: Nurse Practitioner

## 2021-09-29 ENCOUNTER — Ambulatory Visit: Payer: 59 | Admitting: Nurse Practitioner

## 2021-09-29 ENCOUNTER — Other Ambulatory Visit: Payer: Self-pay

## 2021-09-29 VITALS — BP 122/85 | HR 72 | Temp 98.1°F | Resp 16 | Ht 69.0 in | Wt 211.2 lb

## 2021-09-29 DIAGNOSIS — J018 Other acute sinusitis: Secondary | ICD-10-CM

## 2021-09-29 DIAGNOSIS — F5101 Primary insomnia: Secondary | ICD-10-CM

## 2021-09-29 DIAGNOSIS — E782 Mixed hyperlipidemia: Secondary | ICD-10-CM

## 2021-09-29 DIAGNOSIS — E2839 Other primary ovarian failure: Secondary | ICD-10-CM

## 2021-09-29 DIAGNOSIS — E559 Vitamin D deficiency, unspecified: Secondary | ICD-10-CM

## 2021-09-29 DIAGNOSIS — H6501 Acute serous otitis media, right ear: Secondary | ICD-10-CM | POA: Diagnosis not present

## 2021-09-29 DIAGNOSIS — E040 Nontoxic diffuse goiter: Secondary | ICD-10-CM | POA: Diagnosis not present

## 2021-09-29 MED ORDER — AMOXICILLIN-POT CLAVULANATE 875-125 MG PO TABS: 1.0000 | ORAL_TABLET | Freq: Two times a day (BID) | ORAL | 0 refills | Status: DC

## 2021-09-29 NOTE — Progress Notes (Signed)
[facility] [place], [place] [address]  Internal MEDICINE  Office Visit Note  Patient Name: Colleen Richard  [id number]  [id number]  Date of Service: 09/29/2021  Chief Complaint  Patient presents with   Acute Visit   Ear Pain    Right is worse than left, congestion since after Christmas, clear mucus, OTC meds have helped everything except ears     HPI Colleen Richard presents for an acute sick visit for possible symptoms of sinusitis and/or ear infection. She just got over a recent upper respiratory infection. She feels like her ears are still stopped up, muffled hearing, cracking and popping. She took theraflu but no other medications. She denies any ear pain. She did have nasal congestion, cough, sinus pressure, and runny nose.    Current Medication:  Outpatient Encounter Medications as of 09/29/2021  Medication Sig   albuterol (VENTOLIN HFA) 108 (90 Base) MCG/ACT inhaler Inhale 2 puffs into the lungs every 6 (six) hours as needed for wheezing or shortness of breath.   amoxicillin-clavulanate (AUGMENTIN) 875-125 MG tablet Take 1 tablet by mouth 2 (two) times daily.   anastrozole (ARIMIDEX) 1 MG tablet Take 1 tablet (1 mg total) by mouth daily.   aspirin-acetaminophen-caffeine (EXCEDRIN MIGRAINE) 250-250-65 MG tablet Take 2 tablets by mouth every 6 (six) hours as needed for headache.   azithromycin (ZITHROMAX) 250 MG tablet Take 2 tablets on day 1, then 1 tablet daily on days 2 through 5   CALCIUM CARBONATE-VITAMIN D PO Take 2 tablets by mouth.   ipratropium-albuterol (DUONEB) 0.5-2.5 (3) MG/3ML SOLN Take 3 mLs by nebulization every 6 (six) hours as needed.   zolpidem (AMBIEN) 10 MG tablet Take 1 tablet (10 mg total) by mouth at bedtime as needed for sleep.   No facility-administered encounter medications on file as of 09/29/2021.      Medical History: Past Medical History:  Diagnosis Date   Abnormal mammogram    Asthma    Cancer ([place]) 12/2019   breast  cancer on right   Menorrhagia    Migraines    Multiple thyroid nodules 12/2019   biopsied in past. now just following with yearly ultrasounds   Personal history of radiation therapy 2021   right breast ca   Reflux esophagitis      Vital Signs: BP 122/85    Pulse 72    Temp 98.1 F (36.7 C)    Resp 16    Ht '5\' 9"'  (1.753 m)    Wt 211 lb 3.2 oz (95.8 kg)    SpO2 98%    BMI 31.19 kg/m    Review of Systems  Constitutional:  Negative for chills, fatigue and fever.  HENT:  Positive for congestion, ear pain (stopped up, muffled hearing and pressure), postnasal drip, rhinorrhea, sinus pressure and sinus pain. Negative for sore throat.   Eyes:  Negative for pain.  Respiratory:  Positive for cough. Negative for chest tightness, shortness of breath and wheezing.   Cardiovascular: Negative.  Negative for chest pain and palpitations.  Gastrointestinal:  Negative for abdominal pain, constipation, diarrhea, nausea and vomiting.  Musculoskeletal: Negative.  Negative for myalgias.  Skin: Negative.  Negative for rash.   Physical Exam Vitals reviewed.  Constitutional:      Appearance: Normal appearance. She is obese. She is not ill-appearing.  HENT:     Head: Normocephalic and atraumatic.     Right Ear: Ear canal normal. Decreased hearing noted. Swelling present. Tympanic membrane is injected.  Left Ear: Tympanic membrane normal. No decreased hearing noted. No swelling.     Nose: Congestion present. No rhinorrhea.     Mouth/Throat:     Mouth: Mucous membranes are moist.     Pharynx: Posterior oropharyngeal erythema present. No oropharyngeal exudate.  Eyes:     Pupils: Pupils are equal, round, and reactive to light.  Cardiovascular:     Rate and Rhythm: Normal rate and regular rhythm.  Pulmonary:     Effort: Pulmonary effort is normal. No respiratory distress.  Neurological:     Mental Status: She is alert and oriented to person, place, and time.     Cranial Nerves: No cranial nerve  deficit.     Gait: Gait normal.  Psychiatric:        Mood and Affect: Mood normal.        Behavior: Behavior normal.      Assessment/Plan: 1. Acute non-recurrent sinusitis of other sinus Empiric antibiotic treatment prescribed. Patient did not tolerate augmentin, azithromycin was sent instead.  - amoxicillin-clavulanate (AUGMENTIN) 875-125 MG tablet; Take 1 tablet by mouth 2 (two) times daily.  Dispense: 14 tablet; Refill: 0 - azithromycin (ZITHROMAX) 250 MG tablet; Take 2 tablets on day 1, then 1 tablet daily on days 2 through 5  Dispense: 6 tablet; Refill: 0  2. Non-recurrent acute serous otitis media of right ear Empiric antibiotic treatment prescribed - amoxicillin-clavulanate (AUGMENTIN) 875-125 MG tablet; Take 1 tablet by mouth 2 (two) times daily.  Dispense: 14 tablet; Refill: 0 - azithromycin (ZITHROMAX) 250 MG tablet; Take 2 tablets on day 1, then 1 tablet daily on days 2 through 5  Dispense: 6 tablet; Refill: 0  3. Vitamin D deficiency Routine labs ordered - CBC with Differential/Platelet - CMP14+EGFR - Vitamin D (25 hydroxy)  4. Goiter diffuse, nontoxic Routine labs ordered - CBC with Differential/Platelet - CMP14+EGFR - TSH + free T4  5. Other primary ovarian failure Routine labs ordered - CBC with Differential/Platelet - CMP14+EGFR  6. Primary insomnia Routine labs ordered - CBC with Differential/Platelet - CMP14+EGFR  7. Mixed hyperlipidemia Routine labs ordered - CBC with Differential/Platelet - CMP14+EGFR - Lipid Profile   General Counseling: Colleen Richard verbalizes understanding of the findings of todays visit and agrees with plan of treatment. I have discussed any further diagnostic evaluation that may be needed or ordered today. We also reviewed her medications today. she has been encouraged to call the office with any questions or concerns that should arise related to todays visit.    Counseling:    Orders Placed This Encounter  Procedures   CBC  with Differential/Platelet   CMP14+EGFR   Vitamin D (25 hydroxy)   Lipid Profile   TSH + free T4    Meds ordered this encounter  Medications   amoxicillin-clavulanate (AUGMENTIN) 875-125 MG tablet    Sig: Take 1 tablet by mouth 2 (two) times daily.    Dispense:  14 tablet    Refill:  0   azithromycin (ZITHROMAX) 250 MG tablet    Sig: Take 2 tablets on day 1, then 1 tablet daily on days 2 through 5    Dispense:  6 tablet    Refill:  0    Return if symptoms worsen or fail to improve, for and also has an upcoming annual physical .  [place] Controlled Substance Database was reviewed by me for overdose risk score (ORS)  Time spent:30 Minutes Time spent with patient included reviewing progress notes, labs, imaging studies, and discussing plan for follow  up.   This patient was seen by Jonetta Osgood, FNP-C in collaboration with Dr. Clayborn Bigness as a part of collaborative care agreement.  Ashleigh Arya R. Valetta Fuller, MSN, FNP-C Internal Medicine

## 2021-09-30 ENCOUNTER — Encounter: Payer: Self-pay | Admitting: Nurse Practitioner

## 2021-09-30 MED ORDER — AZITHROMYCIN 250 MG PO TABS: ORAL_TABLET | ORAL | 0 refills | Status: AC

## 2021-10-04 ENCOUNTER — Encounter: Payer: Self-pay | Admitting: Nurse Practitioner

## 2021-10-08 LAB — LIPID PANEL
Chol/HDL Ratio: 3.4 ratio (ref 0.0–4.4)
Cholesterol, Total: 226 mg/dL — ABNORMAL HIGH (ref 100–199)
HDL: 67 mg/dL (ref >39)
LDL Chol Calc (NIH): 141 mg/dL — ABNORMAL HIGH (ref 0–99)
Triglycerides: 104 mg/dL (ref 0–149)
VLDL Cholesterol Cal: 18 mg/dL (ref 5–40)

## 2021-10-08 LAB — CMP14+EGFR
ALT: 16 IU/L (ref 0–32)
AST: 15 IU/L (ref 0–40)
Albumin/Globulin Ratio: 1.9 (ref 1.2–2.2)
Albumin: 4.3 g/dL (ref 3.8–4.9)
Alkaline Phosphatase: 85 IU/L (ref 44–121)
BUN/Creatinine Ratio: 24 — ABNORMAL HIGH (ref 9–23)
BUN: 21 mg/dL (ref 6–24)
Bilirubin Total: 0.5 mg/dL (ref 0.0–1.2)
CO2: 23 mmol/L (ref 20–29)
Calcium: 9.4 mg/dL (ref 8.7–10.2)
Chloride: 103 mmol/L (ref 96–106)
Creatinine, Ser: 0.86 mg/dL (ref 0.57–1.00)
Globulin, Total: 2.3 g/dL (ref 1.5–4.5)
Glucose: 92 mg/dL (ref 70–99)
Potassium: 4.7 mmol/L (ref 3.5–5.2)
Sodium: 140 mmol/L (ref 134–144)
Total Protein: 6.6 g/dL (ref 6.0–8.5)
eGFR: 80 mL/min/{1.73_m2} (ref >59)

## 2021-10-08 LAB — TSH+FREE T4
Free T4: 1.2 ng/dL (ref 0.82–1.77)
TSH: 1.52 u[IU]/mL (ref 0.450–4.500)

## 2021-10-08 LAB — CBC WITH DIFFERENTIAL/PLATELET
Basophils Absolute: 0 10*3/uL (ref 0.0–0.2)
Basos: 1 %
EOS (ABSOLUTE): 0.1 10*3/uL (ref 0.0–0.4)
Eos: 3 %
Hematocrit: 41.4 % (ref 34.0–46.6)
Hemoglobin: 13.9 g/dL (ref 11.1–15.9)
Immature Grans (Abs): 0 10*3/uL (ref 0.0–0.1)
Immature Granulocytes: 0 %
Lymphocytes Absolute: 1.3 10*3/uL (ref 0.7–3.1)
Lymphs: 31 %
MCH: 28.4 pg (ref 26.6–33.0)
MCHC: 33.6 g/dL (ref 31.5–35.7)
MCV: 85 fL (ref 79–97)
Monocytes Absolute: 0.3 10*3/uL (ref 0.1–0.9)
Monocytes: 7 %
Neutrophils Absolute: 2.4 10*3/uL (ref 1.4–7.0)
Neutrophils: 58 %
Platelets: 248 10*3/uL (ref 150–450)
RBC: 4.89 x10E6/uL (ref 3.77–5.28)
RDW: 12.5 % (ref 11.7–15.4)
WBC: 4.2 10*3/uL (ref 3.4–10.8)

## 2021-10-08 LAB — VITAMIN D 25 HYDROXY (VIT D DEFICIENCY, FRACTURES): Vit D, 25-Hydroxy: 23.2 ng/mL — ABNORMAL LOW (ref 30.0–100.0)

## 2021-10-08 NOTE — Progress Notes (Signed)
I have reviewed the lab results. There are no critically abnormal values requiring immediate intervention but there are some abnormals that will be discussed at the next office visit.  

## 2021-11-02 ENCOUNTER — Other Ambulatory Visit: Payer: Self-pay

## 2021-11-02 ENCOUNTER — Encounter: Payer: Self-pay | Admitting: Nurse Practitioner

## 2021-11-02 ENCOUNTER — Ambulatory Visit (INDEPENDENT_AMBULATORY_CARE_PROVIDER_SITE_OTHER): Payer: 59 | Admitting: Nurse Practitioner

## 2021-11-02 VITALS — BP 122/82 | HR 74 | Temp 98.0°F | Resp 16 | Ht 69.0 in | Wt 217.0 lb

## 2021-11-02 DIAGNOSIS — Z0001 Encounter for general adult medical examination with abnormal findings: Secondary | ICD-10-CM | POA: Diagnosis not present

## 2021-11-02 DIAGNOSIS — R300 Dysuria: Secondary | ICD-10-CM

## 2021-11-02 DIAGNOSIS — C50411 Malignant neoplasm of upper-outer quadrant of right female breast: Secondary | ICD-10-CM

## 2021-11-02 DIAGNOSIS — E782 Mixed hyperlipidemia: Secondary | ICD-10-CM | POA: Diagnosis not present

## 2021-11-02 DIAGNOSIS — E040 Nontoxic diffuse goiter: Secondary | ICD-10-CM | POA: Diagnosis not present

## 2021-11-02 DIAGNOSIS — E559 Vitamin D deficiency, unspecified: Secondary | ICD-10-CM | POA: Diagnosis not present

## 2021-11-02 DIAGNOSIS — Z170 Estrogen receptor positive status [ER+]: Secondary | ICD-10-CM

## 2021-11-02 DIAGNOSIS — F5101 Primary insomnia: Secondary | ICD-10-CM

## 2021-11-02 MED ORDER — ZOLPIDEM TARTRATE 10 MG PO TABS: 10.0000 mg | ORAL_TABLET | Freq: Every evening | ORAL | 1 refills | Status: DC | PRN

## 2021-11-02 NOTE — Progress Notes (Unsigned)
[facility] [place], [place] [address]  Internal MEDICINE  Office Visit Note  Patient Name: Colleen Richard  [id number]  [id number]  Date of Service: 11/02/2021  Chief Complaint  Patient presents with   Annual Exam   Hypothyroidism    HPI Colleen Richard presents for an annual well visit and physical exam. she has a history of ____.  -age appropriate screenings and immunizations as appropriate -COVID vacc status -current problems, concerns.  Medication refills Routine labs Work and home life:  Pain: Discussed labs Vit d supp Fish oil      Current Medication: Outpatient Encounter Medications as of 11/02/2021  Medication Sig   albuterol (VENTOLIN HFA) 108 (90 Base) MCG/ACT inhaler Inhale 2 puffs into the lungs every 6 (six) hours as needed for wheezing or shortness of breath.   anastrozole (ARIMIDEX) 1 MG tablet Take 1 tablet (1 mg total) by mouth daily.   aspirin-acetaminophen-caffeine (EXCEDRIN MIGRAINE) 250-250-65 MG tablet Take 2 tablets by mouth every 6 (six) hours as needed for headache.   CALCIUM CARBONATE-VITAMIN D PO Take 2 tablets by mouth.   ipratropium-albuterol (DUONEB) 0.5-2.5 (3) MG/3ML SOLN Take 3 mLs by nebulization every 6 (six) hours as needed.   [DISCONTINUED] amoxicillin-clavulanate (AUGMENTIN) 875-125 MG tablet Take 1 tablet by mouth 2 (two) times daily.   zolpidem (AMBIEN) 10 MG tablet Take 1 tablet (10 mg total) by mouth at bedtime as needed for sleep.   [DISCONTINUED] zolpidem (AMBIEN) 10 MG tablet Take 1 tablet (10 mg total) by mouth at bedtime as needed for sleep.   No facility-administered encounter medications on file as of 11/02/2021.    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   BREAST LUMPECTOMY WITH RADIOFREQUENCY TAG IDENTIFICATION Right 12/25/2019   Procedure: BREAST LUMPECTOMY WITH RADIOFREQUENCY TAG IDENTIFICATION;  Surgeon: Ronny Bacon, MD;  Location: [facility] ORS;  Service: General;  Laterality: Right;   CATARACT EXTRACTION, BILATERAL  01/2013, 2015   COLONOSCOPY WITH PROPOFOL N/A 11/03/2017   Procedure: COLONOSCOPY WITH PROPOFOL;  Surgeon: Virgel Manifold, MD;  Location: [facility] ENDOSCOPY;  Service: Endoscopy;  Laterality: N/A;   EYE SURGERY  2018   cataract and retinal detachment   EYE SURGERY Left 08/04/2021   hole in macular and detached retina right eye  05/31/2017   PARTIAL HYSTERECTOMY  2004   SENTINEL NODE BIOPSY Right 12/25/2019   Procedure: SENTINEL NODE BIOPSY;  Surgeon: Ronny Bacon, MD;  Location: [facility] ORS;  Service: General;  Laterality: Right;   TUBAL LIGATION      Medical History: Past Medical History:  Diagnosis Date   Abnormal mammogram    Asthma    Cancer ([place]) 12/2019   breast cancer on right   Menorrhagia    Migraines    Multiple thyroid nodules 12/2019   biopsied in past. now just following with yearly ultrasounds   Personal history of radiation therapy 2021   right breast ca   Reflux esophagitis     Family History: Family History  Problem Relation Age of Onset   COPD Mother    Asthma Mother    Atrial fibrillation Mother    Heart disease Father    Alzheimer's  disease Maternal Grandmother    Alzheimer's disease Maternal Aunt    Breast cancer Neg Hx     Social History   Socioeconomic History   Marital status: Married    Spouse name: Linwood   Number of children: Not on file   Years of education: Not on file   Highest education level: Not on file  Occupational History   Not on file  Tobacco Use   Smoking status: Never   Smokeless tobacco: Never  Vaping Use   Vaping Use: Never used  Substance and Sexual Activity   Alcohol use: No   Drug use: No    Sexual activity: Yes  Other Topics Concern   Not on file  Social History Narrative   Patient lives husband in her home.   Social Determinants of Health   Financial Resource Strain: Not on file  Food Insecurity: Not on file  Transportation Needs: Not on file  Physical Activity: Not on file  Stress: Not on file  Social Connections: Not on file  Intimate Partner Violence: Not on file      Review of Systems  Constitutional:  Negative for activity change, appetite change, chills, fatigue, fever and unexpected weight change.  HENT: Negative.  Negative for congestion, ear pain, rhinorrhea, sore throat and trouble swallowing.   Eyes: Negative.   Respiratory: Negative.  Negative for cough, chest tightness, shortness of breath and wheezing.   Cardiovascular: Negative.  Negative for chest pain.  Gastrointestinal: Negative.  Negative for abdominal pain, blood in stool, constipation, diarrhea, nausea and vomiting.  Endocrine: Negative.   Genitourinary: Negative.  Negative for difficulty urinating, dysuria, frequency, hematuria and urgency.  Musculoskeletal: Negative.  Negative for arthralgias, back pain, joint swelling, myalgias and neck pain.  Skin: Negative.  Negative for rash and wound.  Allergic/Immunologic: Negative.  Negative for immunocompromised state.  Neurological: Negative.  Negative for dizziness, seizures, numbness and headaches.  Hematological: Negative.   Psychiatric/Behavioral: Negative.  Negative for behavioral problems, self-injury and suicidal ideas. The patient is not nervous/anxious.    Vital Signs: BP 122/82    Pulse 74    Temp 98 F (36.7 C)    Resp 16    Ht 5\' 9"  (1.753 m)    Wt 217 lb (98.4 kg)    SpO2 96%    BMI 32.05 kg/m    Physical Exam Vitals reviewed.  Constitutional:      General: She is not in acute distress.    Appearance: She is well-developed. She is not diaphoretic.  HENT:     Head: Normocephalic and atraumatic.     Right Ear: External ear  normal.     Left Ear: External ear normal.     Nose: Nose normal.     Mouth/Throat:     Pharynx: No oropharyngeal exudate.  Eyes:     General: No scleral icterus.       Right eye: No discharge.        Left eye: No discharge.     Conjunctiva/sclera: Conjunctivae normal.     Pupils: Pupils are equal, round, and reactive to light.  Neck:     Thyroid: No thyromegaly.     Vascular: No JVD.     Trachea: No tracheal deviation.  Cardiovascular:     Rate and Rhythm: Normal rate and regular rhythm.     Heart sounds: Normal heart sounds. No murmur heard.   No friction rub. No gallop.  Pulmonary:     Effort: Pulmonary effort is normal.  No respiratory distress.     Breath sounds: Normal breath sounds. No stridor. No wheezing or rales.  Chest:     Chest wall: No tenderness.  Abdominal:     General: Bowel sounds are normal. There is no distension.     Palpations: Abdomen is soft. There is no mass.     Tenderness: There is no abdominal tenderness. There is no guarding or rebound.  Musculoskeletal:        General: No tenderness or deformity. Normal range of motion.     Cervical back: Normal range of motion and neck supple.  Lymphadenopathy:     Cervical: No cervical adenopathy.  Skin:    General: Skin is warm and dry.     Capillary Refill: Capillary refill takes less than 2 seconds.     Coloration: Skin is not pale.     Findings: No erythema or rash.  Neurological:     Mental Status: She is alert and oriented to person, place, and time.     Cranial Nerves: No cranial nerve deficit.     Motor: No abnormal muscle tone.     Coordination: Coordination normal.     Gait: Gait normal.     Deep Tendon Reflexes: Reflexes are normal and symmetric.  Psychiatric:        Mood and Affect: Mood normal.        Behavior: Behavior normal.        Thought Content: Thought content normal.        Judgment: Judgment normal.       Assessment/Plan: 1. Dysuria - UA/M w/rflx Culture, Routine  2.  Primary insomnia - zolpidem (AMBIEN) 10 MG tablet; Take 1 tablet (10 mg total) by mouth at bedtime as needed for sleep.  Dispense: 30 tablet; Refill: 1  3. Goiter diffuse, nontoxic - US THYROID; Future     General Counseling: Colleen Richard verbalizes understanding of the findings of todays visit and agrees with plan of treatment. I have discussed any further diagnostic evaluation that may be needed or ordered today. We also reviewed her medications today. she has been encouraged to call the office with any questions or concerns that should arise related to todays visit.    Orders Placed This Encounter  Procedures   US THYROID   UA/M w/rflx Culture, Routine    Meds ordered this encounter  Medications   zolpidem (AMBIEN) 10 MG tablet    Sig: Take 1 tablet (10 mg total) by mouth at bedtime as needed for sleep.    Dispense:  30 tablet    Refill:  1    Return in about 6 months (around 05/02/2022) for F/U, med refill, Gisel Vipond PCP.   Total time spent:*** Minutes Time spent includes review of chart, medications, test results, and follow up plan with the patient.   [place] Controlled Substance Database was reviewed by me.  This patient was seen by Jonetta Osgood, FNP-C in collaboration with Dr. Clayborn Bigness as a part of collaborative care agreement.  Elgie Landino R. Valetta Fuller, MSN, FNP-C Internal medicine

## 2021-11-03 LAB — UA/M W/RFLX CULTURE, ROUTINE
Bilirubin, UA: NEGATIVE
Glucose, UA: NEGATIVE
Ketones, UA: NEGATIVE
Leukocytes,UA: NEGATIVE
Nitrite, UA: NEGATIVE
Protein,UA: NEGATIVE
RBC, UA: NEGATIVE
Specific Gravity, UA: 1.016 (ref 1.005–1.030)
Urobilinogen, Ur: 0.2 mg/dL (ref 0.2–1.0)
pH, UA: 8 — ABNORMAL HIGH (ref 5.0–7.5)

## 2021-11-03 LAB — MICROSCOPIC EXAMINATION
Bacteria, UA: NONE SEEN
Casts: NONE SEEN /lpf
RBC, Urine: NONE SEEN /hpf (ref 0–2)
WBC, UA: NONE SEEN /hpf (ref 0–5)

## 2021-11-10 ENCOUNTER — Ambulatory Visit
Admission: RE | Admit: 2021-11-10 | Discharge: 2021-11-10 | Disposition: A | Discharge status: Home / Self Care | Payer: 59 | Source: Ambulatory Visit | Attending: Oncology | Admitting: Oncology

## 2021-11-10 ENCOUNTER — Other Ambulatory Visit: Payer: Self-pay

## 2021-11-10 DIAGNOSIS — C50411 Malignant neoplasm of upper-outer quadrant of right female breast: Secondary | ICD-10-CM | POA: Diagnosis not present

## 2021-11-10 DIAGNOSIS — Z170 Estrogen receptor positive status [ER+]: Secondary | ICD-10-CM | POA: Diagnosis present

## 2021-11-25 ENCOUNTER — Other Ambulatory Visit: Payer: Self-pay | Admitting: Nurse Practitioner

## 2021-11-29 ENCOUNTER — Inpatient Hospital Stay: Payer: 59 | Attending: Oncology | Admitting: Oncology

## 2021-11-29 ENCOUNTER — Other Ambulatory Visit: Payer: Self-pay

## 2021-11-29 DIAGNOSIS — T451X5A Adverse effect of antineoplastic and immunosuppressive drugs, initial encounter: Secondary | ICD-10-CM | POA: Diagnosis not present

## 2021-11-29 DIAGNOSIS — C50411 Malignant neoplasm of upper-outer quadrant of right female breast: Secondary | ICD-10-CM | POA: Diagnosis not present

## 2021-11-29 DIAGNOSIS — Z170 Estrogen receptor positive status [ER+]: Secondary | ICD-10-CM | POA: Diagnosis not present

## 2021-11-29 DIAGNOSIS — R232 Flushing: Secondary | ICD-10-CM | POA: Diagnosis not present

## 2021-11-29 MED ORDER — ANASTROZOLE 1 MG PO TABS: 1.0000 mg | ORAL_TABLET | Freq: Every day | ORAL | 3 refills | Status: DC

## 2021-11-29 NOTE — Progress Notes (Signed)
[place]  [phone] [phone]  ID: Colleen Richard OB: [birth year]  MR#: [id number]  JSH#:[id number]  Patient Care Team: Lavera Guise, MD as PCP - General (Internal Medicine) Theodore Demark, RN as Oncology Nurse Navigator Grayland Ormond, Kathlene November, MD as Consulting Physician (Oncology) Noreene Filbert, MD as Radiation Oncologist (Radiation Oncology)  I connected with Colleen Richard on 11/29/21 at  3:00 PM EDT by telephone visit and verified that I am speaking with the correct person using two identifiers.   I discussed the limitations, risks, security and privacy concerns of performing an evaluation and management service by telemedicine and the availability of in-person appointments. I also discussed with the patient that there may be a patient responsible charge related to this service. The patient expressed understanding and agreed to proceed.   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  INTERVAL HISTORY: Patient returns for telephone visit for routine 65-monthfollow-up.  She continues anastrozole.  Has hot flashes, joint pain and fatigue that has been manageable.  Tolerating anastrozole better than letrozole.  Notes weight gain.  States she is going to try and stay on the medication as long as possible although it does not make her feel well.  Takes in the morning.  Has insomnia and is currently on Ambien.  REVIEW OF SYSTEMS:   Review of Systems  Constitutional:  Positive for diaphoresis and malaise/fatigue.  Musculoskeletal:  Positive for joint pain.  Psychiatric/Behavioral:  The patient has insomnia.    As per HPI. Otherwise, a complete review of systems is negative.  PAST MEDICAL HISTORY: Past  Medical History:  Diagnosis Date   Abnormal mammogram    Asthma    [place]   breast cancer on right   Menorrhagia    Migraines    Multiple thyroid nodules 12/2019   biopsied in past. 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   BREAST LUMPECTOMY WITH RADIOFREQUENCY TAG IDENTIFICATION Right 12/25/2019   Procedure: BREAST LUMPECTOMY WITH RADIOFREQUENCY TAG IDENTIFICATION;  Surgeon: RRonny Bacon MD;  [facility];  Service: General;  Laterality: Right;   CATARACT EXTRACTION, BILATERAL  01/2013, 2015   COLONOSCOPY WITH PROPOFOL N/A 11/03/2017   Procedure: COLONOSCOPY WITH PROPOFOL;  Surgeon: TVirgel Manifold MD;  Location: [facility];  Service: Endoscopy;  Laterality: N/A;   EYE SURGERY  2018   cataract and retinal detachment   EYE SURGERY Left 08/04/2021   hole in macular and detached retina right eye  05/31/2017   PARTIAL HYSTERECTOMY  2004   SENTINEL NODE BIOPSY Right 12/25/2019   Procedure: SENTINEL NODE BIOPSY;  Surgeon: RRonny Bacon MD;  Location: [facility];  Service: General;  Laterality: Right;   TUBAL LIGATION      FAMILY HISTORY: Family  History  Problem Relation Age of Onset   COPD Mother    Asthma Mother    Atrial fibrillation Mother    Heart disease Father    Alzheimer's disease Maternal Grandmother    Alzheimer's disease Maternal Aunt    Breast cancer Neg Hx     ADVANCED DIRECTIVES (Y/N):  N  HEALTH MAINTENANCE: Social History   Tobacco Use   Smoking status: Never   Smokeless tobacco: Never   Vaping Use   Vaping Use: Never used  Substance Use Topics   Alcohol use: No   Drug use: No     Colonoscopy:  PAP:  Bone density:  Lipid panel:  No Known Allergies  Current Outpatient Medications  Medication Sig Dispense Refill   albuterol (VENTOLIN HFA) 108 (90 Base) MCG/ACT inhaler Inhale 2 puffs into the lungs every 6 (six) hours as needed for wheezing or shortness of breath. 18 g 3   anastrozole (ARIMIDEX) 1 MG tablet Take 1 tablet (1 mg total) by mouth daily. 90 tablet 3   aspirin-acetaminophen-caffeine (EXCEDRIN MIGRAINE) 250-250-65 MG tablet Take 2 tablets by mouth every 6 (six) hours as needed for headache.     CALCIUM CARBONATE-VITAMIN D PO Take 2 tablets by mouth.     ipratropium-albuterol (DUONEB) 0.5-2.5 (3) MG/3ML SOLN Take 3 mLs by nebulization every 6 (six) hours as needed. 360 mL 0   zolpidem (AMBIEN) 10 MG tablet Take 1 tablet (10 mg total) by mouth at bedtime as needed for sleep. 30 tablet 1   No current facility-administered medications for this visit.    OBJECTIVE: There were no vitals filed for this visit.    There is no height or weight on file to calculate BMI.    ECOG FS:0 - Asymptomatic  Physical Exam Neurological:     Mental Status: She is alert and oriented to person, place, and time.     LAB RESULTS:  Lab Results  Component Value Date   NA 140 10/07/2021   K 4.7 10/07/2021   CL 103 10/07/2021   CO2 23 10/07/2021   GLUCOSE 92 10/07/2021   BUN 21 10/07/2021   CREATININE 0.86 10/07/2021   CALCIUM 9.4 10/07/2021   PROT 6.6 10/07/2021   ALBUMIN 4.3 10/07/2021   AST 15 10/07/2021   ALT 16 10/07/2021   ALKPHOS 85 10/07/2021   BILITOT 0.5 10/07/2021   GFRNONAA 59 (L) 11/06/2020   GFRAA 68 11/06/2020    Lab Results  Component Value Date   WBC 4.2 10/07/2021   NEUTROABS 2.4 10/07/2021   HGB 13.9 10/07/2021   HCT 41.4 10/07/2021   MCV 85 10/07/2021   PLT 248 10/07/2021     STUDIES: MM DIAG BREAST TOMO BILATERAL  Result Date:  11/10/2021 CLINICAL DATA:  Right lumpectomy.  Annual mammography. EXAM: DIGITAL DIAGNOSTIC BILATERAL MAMMOGRAM WITH TOMOSYNTHESIS AND CAD TECHNIQUE: Bilateral digital diagnostic mammography and breast tomosynthesis was performed. The images were evaluated with computer-aided detection. COMPARISON:  Previous exam(s). ACR Breast Density Category b: There are scattered areas of fibroglandular density. FINDINGS: The right lumpectomy site appears as expected. IMPRESSION: Stable right lumpectomy.  No evidence of malignancy. RECOMMENDATION: Per protocol, as the patient is now 2 or more years status post lumpectomy, she may return to annual screening mammography in 1 year. However, given the history of breast cancer, the patient remains eligible for annual diagnostic mammography if preferred. I have discussed the findings and recommendations with the patient. If applicable, a reminder letter will be sent to the patient regarding  the next appointment. BI-RADS CATEGORY  2: Benign. Electronically Signed   By: Dorise Bullion III M.D.   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  Did not require Oncotype testing or adjuvant chemotherapy.  Completed adjuvant XRT.  She is postmenopausal.  She could not tolerate letrozole and was transition to anastrozole.  She will complete 5 years of treatment in June 2026.  Most recent mammogram from 11/10/2021 shows BI-RADS Category 2 benign findings.  Had baseline bone density in August 2022 which showed a T score of -0.4.  Hot flashes/joint pain and fatigue-secondary to aromatase inhibitor.  She has already tried letrozole and is currently on anastrozole.  States symptoms are better on anastrozole although still occurring several times daily.  Recommend switching time of day she take the medication from morning to nighttime to see if  this helps with her symptoms.  If unable to tolerate, could switch to Aromasin or stop taking altogether.  She will discuss with Dr. FGrayland Ormondat next visit.  Disposition - Continue anastrozole.  Will need repeat bone density in August 2024.  Follow-up in 6 months.  Mammogram in February 2024.  Refill sent to trial drug for anastrozole.  I provided 20 minutes of non face-to-face telephone visit time during this encounter, and > 50% was spent counseling as documented under my assessment & plan.   Patient expressed understanding and was in agreement with this plan. She also understands that She can call clinic at any time with any questions, concerns, or complaints.    Cancer Staging  Malignant neoplasm of upper-outer quadrant of right female breast ([facility] Staging form: Breast, AJCC 8th Edition - Clinical stage from 01/03/2020: Stage IA (cT1b, cN0, cM0, G1, ER+, PR+, HER2-) - Signed by FLloyd Huger MD on 01/03/2020 Stage prefix: Initial diagnosis Histologic grading system: 3 grade system   JJacquelin Hawking NP   11/29/2021 1:58 PM

## 2021-12-01 ENCOUNTER — Telehealth: Payer: Self-pay

## 2021-12-01 NOTE — Telephone Encounter (Signed)
Left vm to confirm 12/08/21 appointment-Toni ?

## 2021-12-08 ENCOUNTER — Other Ambulatory Visit: Payer: Self-pay

## 2021-12-08 ENCOUNTER — Ambulatory Visit (INDEPENDENT_AMBULATORY_CARE_PROVIDER_SITE_OTHER): Payer: 59

## 2021-12-08 DIAGNOSIS — E040 Nontoxic diffuse goiter: Secondary | ICD-10-CM | POA: Diagnosis not present

## 2022-01-23 ENCOUNTER — Encounter: Payer: Self-pay | Admitting: Oncology

## 2022-01-31 ENCOUNTER — Telehealth: Payer: Self-pay | Admitting: Medical Oncology

## 2022-01-31 NOTE — Telephone Encounter (Signed)
WF [id number] Understanding and Predicting Breast Cancer Events after Treatment (UPBEAT)  ? ?Outgoing call ? ?Spoke with patient regarding her 24 months on study has arrived and informed her that I need to schedule her study cardiac MRI prior to July 31st. Inquired with patient a few available dates. Patient provided me with four dates in June. Informed patient I will call her back with an appointment time based on one of those dates. Patient also informed that physical assessment needs to be completed as well. Patient will be in clinic for an appointment with Dr. Donella Stade on July 31st, inquired with patient if this is a good day for her to complete that portion of the study, patient agreed that it was. Patient thanked for her time and knows to expect call with cardiac MRI appointment time.  ?Patient denies questions at this time.  ?Maxwell Marion, RN, BSN, [place] ?Clinical Research Nurse Lead ?01/31/2022 4:00 PM ? ?

## 2022-02-01 ENCOUNTER — Other Ambulatory Visit (HOSPITAL_COMMUNITY): Payer: Self-pay | Admitting: Nephrology

## 2022-02-01 DIAGNOSIS — C50919 Malignant neoplasm of unspecified site of unspecified female breast: Secondary | ICD-10-CM

## 2022-02-03 ENCOUNTER — Telehealth: Payer: Self-pay | Admitting: Medical Oncology

## 2022-02-03 NOTE — Telephone Encounter (Signed)
WF [id number] Understanding and Predicting Breast Cancer Events after Treatment (UPBEAT)   Outgoing call: regarding month 24 assessments  Spoke with patient regarding cardiac MRI appointment, which has been scheduled for June 20th @ 10 AM, patient knows to arrive at 0930 at [facility] for this appointment. I confirmed that she would still be good with completely the remaining assessments after her appointment with Dr. Baruch Gouty on July 31st and that she will have study research labs collected after her RT appointment and prior to her assessment with me. Patient informed me that in the past she has completed the study questionnaires online, informed patient that I will have those sent to her email closer to her July appointment. Patient gave verbal understanding and denies questions at this time. Patient thanked and encouraged to call in the meantime.   Maxwell Marion, RN, BSN, [facility] Clinical Research Nurse Lead 02/03/2022 2:40 PM

## 2022-03-07 ENCOUNTER — Telehealth: Payer: Self-pay | Admitting: Medical Oncology

## 2022-03-07 NOTE — Telephone Encounter (Signed)
WF [id number] Understanding and Predicting Breast Cancer Events after Treatment (UPBEAT)   Outgoing call: [phone]  Confirmed with patient study cardiac MRI appointment scheduled for tomorrow at [facility], patient to arrive at 930. Patient informed where to check in at arrival.  Patient thanked.  Maxwell Marion, RN, BSN, [facility] Clinical Research Nurse Lead 03/07/2022 4:05 PM

## 2022-03-08 ENCOUNTER — Ambulatory Visit (HOSPITAL_COMMUNITY)
Admission: RE | Admit: 2022-03-08 | Discharge: 2022-03-08 | Disposition: A | Discharge status: Home / Self Care | Payer: 59 | Source: Ambulatory Visit | Attending: Nephrology | Admitting: Nephrology

## 2022-03-08 DIAGNOSIS — C50919 Malignant neoplasm of unspecified site of unspecified female breast: Secondary | ICD-10-CM | POA: Insufficient documentation

## 2022-03-09 ENCOUNTER — Encounter: Payer: Self-pay | Admitting: Oncology

## 2022-03-09 ENCOUNTER — Other Ambulatory Visit: Payer: Self-pay | Admitting: *Deleted

## 2022-03-09 DIAGNOSIS — C50411 Malignant neoplasm of upper-outer quadrant of right female breast: Secondary | ICD-10-CM

## 2022-03-09 NOTE — Progress Notes (Signed)
ry

## 2022-03-14 ENCOUNTER — Other Ambulatory Visit: Payer: Self-pay | Admitting: Nurse Practitioner

## 2022-03-14 DIAGNOSIS — F5101 Primary insomnia: Secondary | ICD-10-CM

## 2022-03-15 MED ORDER — ZOLPIDEM TARTRATE 10 MG PO TABS: 10.0000 mg | ORAL_TABLET | Freq: Every evening | ORAL | 1 refills | Status: DC | PRN

## 2022-04-06 NOTE — Progress Notes (Signed)
REFERRING PROVIDER: Finnegan, Timothy J, MD [address] [place],  [place] [address]  PRIMARY PROVIDER:  Khan, Fozia M, MD  PRIMARY REASON FOR VISIT:  1. Malignant neoplasm of upper-outer quadrant of right breast in female, estrogen receptor positive (HCC)      HISTORY OF PRESENT ILLNESS:   Ms. Heathcock, a 56 y.o. female, was seen for a [facility] cancer genetics consultation at the request of Dr. Finnegan due to a personal history of breast cancer.  Ms. Ivanoff presents to clinic today to discuss the possibility of a hereditary predisposition to cancer, genetic testing, and to further clarify her future cancer risks, as well as potential cancer risks for family members.   CANCER HISTORY:  In 2021, at the age of 54, Ms. Crysler was diagnosed with right breast cancer, ER/PR+, HER2-. This was treated with lumpectomy, adjuvant radiation and anastrozole.  Oncology History  Malignant neoplasm of upper-outer quadrant of right female breast (HCC)  12/14/2019 Initial Diagnosis   Malignant neoplasm of upper-outer quadrant of right female breast (HCC)   01/03/2020 Cancer Staging   Staging form: Breast, AJCC 8th Edition - Clinical stage from 01/03/2020: Stage IA (cT1b, cN0, cM0, G1, ER+, PR+, HER2-) - Signed by Finnegan, Timothy J, MD on 01/03/2020     RISK FACTORS:  Menarche was at age 13.  First live birth at age 18.  OCP use for approximately 10 years.  Ovaries intact: yes.  Hysterectomy: yes.  Menopausal status: postmenopausal.  HRT use: 6 months Colonoscopy: yes; normal.  Past Medical History:  Diagnosis Date   Abnormal mammogram    Asthma    Cancer (HCC) 12/2019   breast cancer on right   Menorrhagia    Migraines    Multiple thyroid nodules 12/2019   biopsied in past. now just following with yearly ultrasounds   Personal history of radiation therapy 2021   right breast ca   Reflux esophagitis     Past Surgical History:  Procedure Laterality Date   ABDOMINAL  HYSTERECTOMY  2004   BREAST BIOPSY Right 08/26/2015   stereo, top hat clip    BREAST BIOPSY Right 12/10/2019   affirm, x marker,[facility]   BREAST EXCISIONAL BIOPSY Bilateral 2010   neg   BREAST LUMPECTOMY Right 12/25/2019   [facility], low grade DCIS, Negative LNs   BREAST LUMPECTOMY WITH RADIOFREQUENCY TAG IDENTIFICATION Right 12/25/2019   Procedure: BREAST LUMPECTOMY WITH RADIOFREQUENCY TAG IDENTIFICATION;  Surgeon: Rodenberg, Denny, MD;  Location: [facility];  Service: General;  Laterality: Right;   CATARACT EXTRACTION, BILATERAL  01/2013, 2015   COLONOSCOPY WITH PROPOFOL N/A 11/03/2017   Procedure: COLONOSCOPY WITH PROPOFOL;  Surgeon: Tahiliani, Varnita B, MD;  Location: [facility];  Service: Endoscopy;  Laterality: N/A;   EYE SURGERY  2018   cataract and retinal detachment   EYE SURGERY Left 08/04/2021   hole in macular and detached retina right eye  05/31/2017   PARTIAL HYSTERECTOMY  2004   SENTINEL NODE BIOPSY Right 12/25/2019   Procedure: SENTINEL NODE BIOPSY;  Surgeon: Rodenberg, Denny, MD;  Location: [facility];  Service: General;  Laterality: Right;   TUBAL LIGATION      FAMILY HISTORY:  We obtained a detailed, 4-generation family history.  Significant diagnoses are listed below: Family History  Problem Relation Age of Onset   COPD Mother    Asthma Mother    Atrial fibrillation Mother    Heart disease Father    Alzheimer's disease Maternal Grandmother    Alzheimer's disease Maternal Aunt      Breast cancer Neg Hx    Ms. Lorensen has 1 daughter, 28, and 1 son, 38, no cancers. She has 1 brother, 54.   Ms. Fors's mother is living at 72. Maternal grandfather's sister had breast cancer, unknown age of diagnosis, passed in her 80s. No other known cancers on this side of the family. No known cancers on paternal side of the family.  Ms. Hansson is unaware of previous family history of genetic testing for hereditary cancer risks. There is no reported Ashkenazi Jewish ancestry. There is  no known consanguinity.    GENETIC COUNSELING ASSESSMENT: Ms. Whitham is a 56 y.o. female with a personal history of breast cancer which is somewhat suggestive of a hereditary cancer syndrome and predisposition to cancer. We, therefore, discussed and recommended the following at today's visit.   DISCUSSION: We discussed that approximately 10% of breast cancer is hereditary. Most cases of hereditary breast cancer are associated with BRCA1/BRCA2 genes, although there are other genes associated with hereditary cancer as well. Cancers and risks are gene specific. We discussed that testing is beneficial for several reasons including knowing about cancer risks, identifying potential screening and risk-reduction options that may be appropriate, and to understand if other family members could be at risk for cancer and allow them to undergo genetic testing.   We reviewed the characteristics, features and inheritance patterns of hereditary cancer syndromes. We also discussed genetic testing, including the appropriate family members to test, the process of testing, insurance coverage and turn-around-time for results. We discussed the implications of a negative, positive and/or variant of uncertain significant result. We recommended Ms. Avakian pursue genetic testing for the [facility] Multi-Cancer+RNA gene panel.   Based on Ms. Lutter's personal history of cancer, she meets medical criteria for genetic testing. Despite that she meets criteria, she may still have an out of pocket cost. We discussed that if her out of pocket cost for testing is over $100, the laboratory will call and confirm whether she wants to proceed with testing.  If the out of pocket cost of testing is less than $100 she will be billed by the genetic testing laboratory.   PLAN: After considering the risks, benefits, and limitations, Ms. Venditto provided informed consent to pursue genetic testing and the blood sample was sent to [facility] for analysis of the Multi-Cancer+RNA panel. Results should be available within approximately 2-3 weeks' time, at which point they will be disclosed by telephone to Ms. Mccormack, as will any additional recommendations warranted by these results. Ms. Dahms will receive a summary of her genetic counseling visit and a copy of her results once available. This information will also be available in Epic.   Ms. Guion's questions were answered to her satisfaction today. Our contact information was provided should additional questions or concerns arise. Thank you for the referral and allowing us to share in the care of your patient.   Brianna Cowan, MS, LCGC Genetic Counselor [email or web address] Phone: [phone]  The patient was seen for a total of 25 minutes in face-to-face genetic counseling.  Dr. Finnegan was available for discussion regarding this case.   _______________________________________________________________________ For Office Staff:  Number of people involved in session: 1 Was an Intern/ student involved with case: no  

## 2022-04-07 ENCOUNTER — Inpatient Hospital Stay: Payer: 59 | Attending: Oncology | Admitting: Licensed Clinical Social Worker

## 2022-04-07 ENCOUNTER — Encounter: Payer: Self-pay | Admitting: Licensed Clinical Social Worker

## 2022-04-07 ENCOUNTER — Inpatient Hospital Stay: Payer: 59

## 2022-04-07 DIAGNOSIS — C50411 Malignant neoplasm of upper-outer quadrant of right female breast: Secondary | ICD-10-CM | POA: Diagnosis not present

## 2022-04-07 DIAGNOSIS — Z170 Estrogen receptor positive status [ER+]: Secondary | ICD-10-CM

## 2022-04-07 DIAGNOSIS — Z803 Family history of malignant neoplasm of breast: Secondary | ICD-10-CM | POA: Diagnosis not present

## 2022-04-15 ENCOUNTER — Other Ambulatory Visit: Payer: Self-pay | Admitting: Medical Oncology

## 2022-04-15 ENCOUNTER — Telehealth: Payer: Self-pay | Admitting: Medical Oncology

## 2022-04-15 DIAGNOSIS — Z170 Estrogen receptor positive status [ER+]: Secondary | ICD-10-CM

## 2022-04-15 NOTE — Telephone Encounter (Signed)
WF [id number] Understanding and Predicting Breast Cancer Events after Treatment (UPBEAT)   Outgoing call: 35  LVMOM with patient reminding her of her scheduled appointment for Monday, July 31st at 3pm for labs and research assessment to follow. Patient was reminded to fast for three hours prior to her lab appointment and was asked to wear comfortable clothing and shoes for the Physical Performance assessment. Patient was thanked and encouraged to call with questions in the meantime.  Call back number provided.   Maxwell Marion, RN, BSN, [place] Clinical Research Nurse Lead 04/15/2022 2:32 PM

## 2022-04-18 ENCOUNTER — Telehealth: Payer: Self-pay | Admitting: Pharmacy Technician

## 2022-04-18 ENCOUNTER — Inpatient Hospital Stay: Payer: 59

## 2022-04-18 ENCOUNTER — Inpatient Hospital Stay: Payer: 59 | Admitting: Medical Oncology

## 2022-04-18 ENCOUNTER — Ambulatory Visit
Admission: RE | Admit: 2022-04-18 | Discharge: 2022-04-18 | Disposition: A | Discharge status: Home / Self Care | Payer: 59 | Source: Ambulatory Visit | Attending: Radiation Oncology | Admitting: Radiation Oncology

## 2022-04-18 ENCOUNTER — Encounter: Payer: Self-pay | Admitting: Medical Oncology

## 2022-04-18 VITALS — Temp 98.1°F | Resp 18 | Ht 69.0 in | Wt 226.8 lb

## 2022-04-18 DIAGNOSIS — Z170 Estrogen receptor positive status [ER+]: Secondary | ICD-10-CM | POA: Diagnosis not present

## 2022-04-18 DIAGNOSIS — Z923 Personal history of irradiation: Secondary | ICD-10-CM | POA: Diagnosis not present

## 2022-04-18 DIAGNOSIS — C50411 Malignant neoplasm of upper-outer quadrant of right female breast: Secondary | ICD-10-CM | POA: Diagnosis present

## 2022-04-18 DIAGNOSIS — Z79811 Long term (current) use of aromatase inhibitors: Secondary | ICD-10-CM | POA: Diagnosis not present

## 2022-04-18 NOTE — Progress Notes (Signed)
Radiation Oncology Follow up Note  Name: Colleen Richard   Date:   04/18/2022 MRN:  [id number] DOB: [birth year]    This 56 y.o. female presents to the clinic today for 2-year follow-up status post whole breast radiation to her right breast for stage Ia ER/PR positive invasive mammary carcinoma.  REFERRING PROVIDER: Lavera Guise, MD  HPI: Patient is a 56 year old female now out 2 years having completed whole breast radiation to her right breast for stage I ER positive invasive mammary carcinoma seen today in routine follow-up she is doing well.  She specifically denies breast tenderness cough or bone pain.  She is currently on Arimidex tolerating it well without side effect.  Her last mammogram was last February which BI-RADS 2 benign..  COMPLICATIONS OF TREATMENT: none  FOLLOW UP COMPLIANCE: keeps appointments   PHYSICAL EXAM:  Temp 98.1 F (36.7 C)   Resp 18   Ht '5\' 9"'$  (1.753 m)   Wt 226 lb 12.8 oz (102.9 kg)   BMI 33.49 kg/m  Lungs are clear to A&P cardiac examination essentially unremarkable with regular rate and rhythm. No dominant mass or nodularity is noted in either breast in 2 positions examined. Incision is well-healed. No axillary or supraclavicular adenopathy is appreciated. Cosmetic result is excellent.  Well-developed well-nourished patient in NAD. HEENT reveals PERLA, EOMI, discs not visualized.  Oral cavity is clear. No oral mucosal lesions are identified. Neck is clear without evidence of cervical or supraclavicular adenopathy. Lungs are clear to A&P. Cardiac examination is essentially unremarkable with regular rate and rhythm without murmur rub or thrill. Abdomen is benign with no organomegaly or masses noted. Motor sensory and DTR levels are equal and symmetric in the upper and lower extremities. Cranial nerves II through XII are grossly intact. Proprioception is intact. No peripheral adenopathy or edema is identified. No motor or sensory levels are noted. Crude visual  fields are within normal range.  RADIOLOGY RESULTS: Mammograms reviewed compatible with above-stated findings  PLAN: Present time patient is doing well with no evidence of disease now at 2 years she continues on Arimidex without side effect.  I have asked to see her back in 1 year for follow-up.  Then will discontinue follow-up appointments.  Patient is to call me or contact us should she have any concerns.  I would like to take this opportunity to thank you for allowing me to participate in the care of your patient.Colleen Filbert, MD

## 2022-04-18 NOTE — Progress Notes (Signed)
WF [id number] Understanding and Predicting Breast Cancer Events after Treatment (UPBEAT)  04/18/22 - 24 Month Research Visit  Questionnaires:  The patient has completed the required questionnaires, with the Self-Administered and Social Determinants Questionnaires via a direct link from study. Today in clinic patient completed the COVID-19 and KCCQ-12 questionnaires.   Vitals: Vital signs, including blood pressure, pulse, height and weight were collected per study protocol.  Waist circumference measured 44 1/4 inches. BMI 33.4 1st set @ 1437 PM vitals - 146/85, HR 69 2nd set @ 1439 PM vitals - 124/81, HR 72 Weight - 226 lbs Height - 69 inches  Labs:  Research labs were collected at 14:25 PM.  Patient confirms she had fasted for at least 3 hours prior to lab collection.  Medication Review:  The patient medication list was reviewed with this clinical research nurse for accuracy and updated as necessary.  Cardiovascular Events: The patient denies hospitalization or visit to the ER since last research visit.  Patient also denies MI, PCI, CABG, catheterization, stroke, or heart failure diagnosis since last research visit.  Cardiac MRI: Completed 03/08/2022  Neurocognitive Testing: Neurocognitive testing was completed per protocol by Research Specialist, Mauricio Po.    Physical Assessments: 6-minute walk, disability measures, and expanded SPPB assessments were completed, per protocol, by this research nurse.    Gift Card: The patient was given the study provided $25 [employer] gift card for completion of today's research assessments.  Plan:  The patient was informed that the next study assessments are in follow-up and will be completed every 12 months via a telephone call and will ask questions regarding any new cardiac concerns, with the first follow-up call approximately this time next year, 2024. Patient denies having any questions today. I thanked patient for her time and continued  support of study and encouraged her to contact me with any questions she may have related to the study.    Maxwell Marion, RN, BSN, [place] Clinical Research Nurse Lead 04/18/2022 3:44 PM

## 2022-04-18 NOTE — Telephone Encounter (Signed)
Clarified gift card amout

## 2022-04-27 ENCOUNTER — Telehealth: Payer: Self-pay | Admitting: Medical Oncology

## 2022-04-27 NOTE — Telephone Encounter (Signed)
[id number] Understanding and Predicting Breast Cancer Events after Treatment (UPBEAT)   Outgoing call: [phone]  Call to patient to inform her of her lab results from the study blood draw for the 93-monthvisit. Patient informed the lipid panel is elevated. Patient states that this has been ongoing. I asked patient if she would like a copy of results mailed to her, patient stated she would. Patient states she has an appointment with her PCP next week and would like to share the results. Patient denies further questions.  Patient informed copy of lab results with be placed in mail to go out tomorrow.  Patient thanked for her time and encouraged to call with questions.   MMaxwell Marion RN, BSN, CBraxtonClinical Research Nurse Lead 04/27/2022 4:44 PM

## 2022-05-03 ENCOUNTER — Encounter: Payer: Self-pay | Admitting: Nurse Practitioner

## 2022-05-03 ENCOUNTER — Ambulatory Visit: Payer: Self-pay | Admitting: Licensed Clinical Social Worker

## 2022-05-03 ENCOUNTER — Encounter: Payer: Self-pay | Admitting: Licensed Clinical Social Worker

## 2022-05-03 ENCOUNTER — Ambulatory Visit: Payer: 59 | Admitting: Nurse Practitioner

## 2022-05-03 ENCOUNTER — Telehealth: Payer: Self-pay | Admitting: Licensed Clinical Social Worker

## 2022-05-03 VITALS — BP 140/80 | HR 70 | Temp 98.3°F | Resp 16 | Ht 69.0 in | Wt 227.8 lb

## 2022-05-03 DIAGNOSIS — E040 Nontoxic diffuse goiter: Secondary | ICD-10-CM

## 2022-05-03 DIAGNOSIS — Z1379 Encounter for other screening for genetic and chromosomal anomalies: Secondary | ICD-10-CM | POA: Insufficient documentation

## 2022-05-03 DIAGNOSIS — J4520 Mild intermittent asthma, uncomplicated: Secondary | ICD-10-CM

## 2022-05-03 DIAGNOSIS — F5101 Primary insomnia: Secondary | ICD-10-CM | POA: Diagnosis not present

## 2022-05-03 DIAGNOSIS — E782 Mixed hyperlipidemia: Secondary | ICD-10-CM | POA: Diagnosis not present

## 2022-05-03 MED ORDER — ALBUTEROL SULFATE HFA 108 (90 BASE) MCG/ACT IN AERS: 2.0000 | INHALATION_SPRAY | Freq: Four times a day (QID) | RESPIRATORY_TRACT | 3 refills | Status: DC | PRN

## 2022-05-03 MED ORDER — ZOLPIDEM TARTRATE 10 MG PO TABS: 10.0000 mg | ORAL_TABLET | Freq: Every evening | ORAL | 1 refills | Status: DC | PRN

## 2022-05-03 NOTE — Progress Notes (Signed)
[facility] [place], [place] [address]  Internal MEDICINE  Office Visit Note  Patient Name: Colleen Richard  [id number]  [id number]  Date of Service: 05/03/2022  Chief Complaint  Patient presents with   Follow-up    HPI Iyah presents for a follow up visit for thyroid ultrasound results, insomnia and hyperlipidemia. --Not currently on levothyroxine, no significant change in thyroid ultrasound. --Reports research study that she is enrolled and has completed, also reports side effects of anastrozole --Needs refills of albuterol inhaler --Takes Ambien for sleep, needs refills.  No issues with Ambien, denies adverse side effects.    Current Medication: Outpatient Encounter Medications as of 05/03/2022  Medication Sig Note   anastrozole (ARIMIDEX) 1 MG tablet Take 1 tablet (1 mg total) by mouth daily.    aspirin-acetaminophen-caffeine (EXCEDRIN MIGRAINE) 250-250-65 MG tablet Take 2 tablets by mouth every 6 (six) hours as needed for headache.    CALCIUM CARBONATE-VITAMIN D PO Take 2 tablets by mouth.    [DISCONTINUED] albuterol (VENTOLIN HFA) 108 (90 Base) MCG/ACT inhaler Inhale 2 puffs into the lungs every 6 (six) hours as needed for wheezing or shortness of breath.    [DISCONTINUED] zolpidem (AMBIEN) 10 MG tablet Take 1 tablet (10 mg total) by mouth at bedtime as needed for sleep.    albuterol (VENTOLIN HFA) 108 (90 Base) MCG/ACT inhaler Inhale 2 puffs into the lungs every 6 (six) hours as needed for wheezing or shortness of breath.    ipratropium-albuterol (DUONEB) 0.5-2.5 (3) MG/3ML SOLN Take 3 mLs by nebulization every 6 (six) hours as needed. 05/03/2022: Do not send any more refills unless patient asks, she has many   zolpidem (AMBIEN) 10 MG tablet Take 1 tablet (10 mg total) by mouth at bedtime as needed for sleep.    No facility-administered encounter medications on file as of 05/03/2022.    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   BREAST LUMPECTOMY WITH RADIOFREQUENCY TAG IDENTIFICATION Right 12/25/2019   Procedure: BREAST LUMPECTOMY WITH RADIOFREQUENCY TAG IDENTIFICATION;  Surgeon: Ronny Bacon, MD;  Location: [facility] ORS;  Service: General;  Laterality: Right;   CATARACT EXTRACTION, BILATERAL  01/2013, 2015   COLONOSCOPY WITH PROPOFOL N/A 11/03/2017   Procedure: COLONOSCOPY WITH PROPOFOL;  Surgeon: Virgel Manifold, MD;  Location: [facility] ENDOSCOPY;  Service: Endoscopy;  Laterality: N/A;   EYE SURGERY  2018   cataract and retinal detachment   EYE SURGERY Left 08/04/2021   hole in macular and detached retina right eye  05/31/2017   PARTIAL HYSTERECTOMY  2004   SENTINEL NODE BIOPSY Right 12/25/2019   Procedure: SENTINEL NODE BIOPSY;  Surgeon: Ronny Bacon, MD;  Location: [facility] ORS;  Service: General;  Laterality: Right;   TUBAL LIGATION      Medical History: Past Medical History:  Diagnosis Date   Abnormal mammogram    Asthma    Cancer ([place]) 12/2019   breast cancer on right   Menorrhagia    Migraines    Multiple thyroid nodules 12/2019   biopsied in past. now just following with yearly ultrasounds   Personal history of radiation therapy 2021   right breast ca   Reflux esophagitis  Family History: Family History  Problem Relation Age of Onset   COPD Mother    Asthma Mother    Atrial fibrillation Mother    Heart disease Father    Alzheimer's disease Maternal Aunt    Alzheimer's disease Maternal Grandmother    Breast cancer Other     Social History   Socioeconomic History   Marital status: Married    Spouse name: Linwood   Number of children: Not on file   Years of education: Not on file   Highest  education level: Not on file  Occupational History   Not on file  Tobacco Use   Smoking status: Never   Smokeless tobacco: Never  Vaping Use   Vaping Use: Never used  Substance and Sexual Activity   Alcohol use: No   Drug use: No   Sexual activity: Yes  Other Topics Concern   Not on file  Social History Narrative   Patient lives husband in her home.   Social Determinants of Health   Financial Resource Strain: Not on file  Food Insecurity: Not on file  Transportation Needs: Not on file  Physical Activity: Not on file  Stress: Not on file  Social Connections: Not on file  Intimate Partner Violence: Not on file      Review of Systems  Constitutional:  Positive for diaphoresis and fatigue. Negative for chills and unexpected weight change.  HENT:  Positive for postnasal drip. Negative for congestion, rhinorrhea, sneezing and sore throat.   Eyes:  Negative for redness.  Respiratory:  Negative for cough, chest tightness and shortness of breath.   Cardiovascular: Negative.  Negative for chest pain and palpitations.  Gastrointestinal:  Negative for abdominal pain, constipation, diarrhea, nausea and vomiting.  Genitourinary:  Negative for dysuria and frequency.  Musculoskeletal: Negative.  Negative for arthralgias, back pain, joint swelling and neck pain.  Skin:  Negative for rash.  Neurological: Negative.  Negative for tremors and numbness.  Hematological:  Negative for adenopathy. Does not bruise/bleed easily.  Psychiatric/Behavioral:  Positive for decreased concentration and sleep disturbance. Negative for behavioral problems (Depression), self-injury and suicidal ideas. The patient is not nervous/anxious.     Vital Signs: BP (!) 140/80   Pulse 70   Temp 98.3 F (36.8 C)   Resp 16   Ht '5\' 9"'$  (1.753 m)   Wt 227 lb 12.8 oz (103.3 kg)   SpO2 97%   BMI 33.64 kg/m    Physical Exam Vitals reviewed.  Constitutional:      General: She is not in acute distress.     Appearance: Normal appearance. She is obese. She is not ill-appearing.  HENT:     Head: Normocephalic and atraumatic.  Eyes:     Pupils: Pupils are equal, round, and reactive to light.  Cardiovascular:     Rate and Rhythm: Normal rate and regular rhythm.  Pulmonary:     Effort: Pulmonary effort is normal. No respiratory distress.  Neurological:     Mental Status: She is alert and oriented to person, place, and time.  Psychiatric:        Mood and Affect: Mood normal.        Behavior: Behavior normal.        Assessment/Plan: 1. Goiter diffuse, nontoxic Continue annual thyroid ultrasounds  2. Mixed hyperlipidemia Cholesterol levels are normal, continue to monitor periodically, at risk for elevated cholesterol levels due to anastrozole use  3. Mild intermittent asthma without complication Stable, albuterol inhaler refill done - albuterol (  VENTOLIN HFA) 108 (90 Base) MCG/ACT inhaler; Inhale 2 puffs into the lungs every 6 (six) hours as needed for wheezing or shortness of breath.  Dispense: 18 g; Refill: 3  4. Primary insomnia Stable, takes Ambien, refills ordered. - zolpidem (AMBIEN) 10 MG tablet; Take 1 tablet (10 mg total) by mouth at bedtime as needed for sleep.  Dispense: 30 tablet; Refill: 1    General Counseling: Icyss verbalizes understanding of the findings of todays visit and agrees with plan of treatment. I have discussed any further diagnostic evaluation that may be needed or ordered today. We also reviewed her medications today. she has been encouraged to call the office with any questions or concerns that should arise related to todays visit.    No orders of the defined types were placed in this encounter.   Meds ordered this encounter  Medications   zolpidem (AMBIEN) 10 MG tablet    Sig: Take 1 tablet (10 mg total) by mouth at bedtime as needed for sleep.    Dispense:  30 tablet    Refill:  1    For future refills   albuterol (VENTOLIN HFA) 108 (90 Base)  MCG/ACT inhaler    Sig: Inhale 2 puffs into the lungs every 6 (six) hours as needed for wheezing or shortness of breath.    Dispense:  18 g    Refill:  3    Return for previously scheduled, CPE, Augustus Zurawski PCP in february.   Total time spent:30 Minutes Time spent includes review of chart, medications, test results, and follow up plan with the patient.    Controlled Substance Database was reviewed by me.  This patient was seen by Jonetta Osgood, FNP-C in collaboration with Dr. Clayborn Bigness as a part of collaborative care agreement.   Amonte Brookover R. Valetta Fuller, MSN, FNP-C Internal medicine

## 2022-05-03 NOTE — Telephone Encounter (Signed)
I contacted [name] to discuss her genetic testing results. No pathogenic variants were identified in the 84 genes analyzed. Detailed clinic note to follow.   The test report has been scanned into EPIC and is located under the Molecular Pathology section of the Results Review tab.  A portion of the result report is included below for reference.     Colleen Rogue, MS, [facility] Genetic Counselor [email or web address] Phone: [phone]

## 2022-05-03 NOTE — Progress Notes (Signed)
HPI:   Colleen Richard was previously seen in the [facility] due to a personal and family history of cancer and concerns regarding a hereditary predisposition to cancer. Please refer to our prior cancer genetics clinic note for more information regarding our discussion, assessment and recommendations, at the time. Colleen Richard's recent genetic test results were disclosed to her, as were recommendations warranted by these results. These results and recommendations are discussed in more detail below.  CANCER HISTORY:  Oncology History  Malignant neoplasm of upper-outer quadrant of right female breast ([place])  12/14/2019 Initial Diagnosis   Malignant neoplasm of upper-outer quadrant of right female breast ([place])   01/03/2020 Cancer Staging   Staging form: Breast, AJCC 8th Edition - Clinical stage from 01/03/2020: Stage IA (cT1b, cN0, cM0, G1, ER+, PR+, HER2-) - Signed by Lloyd Huger, MD on 01/03/2020     FAMILY HISTORY:  We obtained a detailed, 4-generation family history.  Significant diagnoses are listed below: Family History  Problem Relation Age of Onset   COPD Mother    Asthma Mother    Atrial fibrillation Mother    Heart disease Father    Alzheimer's disease Maternal Aunt    Alzheimer's disease Maternal Grandmother    Breast cancer Other     Colleen Richard has 1 daughter, 54, and 1 son, 8, no cancers. She has 1 brother, 69.    Colleen Richard's mother is living at 51. Maternal grandfather's sister had breast cancer, unknown age of diagnosis, passed in her 24s. No other known cancers on this side of the family. No known cancers on paternal side of the family.   Colleen Richard is unaware of previous family history of genetic testing for hereditary cancer risks. There is no reported Ashkenazi Jewish ancestry. There is no known consanguinity.     GENETIC TEST RESULTS:  The [facility] Multi-Cancer+RNA Panel found no pathogenic mutations.   The Multi-Cancer Panel + RNA  offered by [facility] includes sequencing and/or deletion duplication testing of the following 84 genes: AIP, ALK, APC, ATM, AXIN2,BAP1,  BARD1, BLM, BMPR1A, BRCA1, BRCA2, BRIP1, CASR, CDC73, CDH1, CDK4, CDKN1B, CDKN1C, CDKN2A (p14ARF), CDKN2A (p16INK4a), CEBPA, CHEK2, CTNNA1, DICER1, DIS3L2, EGFR (c.2369C>T, p.Thr790Met variant only), EPCAM (Deletion/duplication testing only), FH, FLCN, GATA2, GPC3, GREM1 (Promoter region deletion/duplication testing only), HOXB13 (c.251G>A, p.Gly84Glu), HRAS, KIT, MAX, MEN1, MET, MITF (c.952G>A, p.Glu318Lys variant only), MLH1, MSH2, MSH3, MSH6, MUTYH, NBN, NF1, NF2, NTHL1, PALB2, PDGFRA, PHOX2B, PMS2, POLD1, POLE, POT1, PRKAR1A, PTCH1, PTEN, RAD50, RAD51C, RAD51D, RB1, RECQL4, RET, RUNX1, SDHAF2, SDHA (sequence changes only), SDHB, SDHC, SDHD, SMAD4, SMARCA4, SMARCB1, SMARCE1, STK11, SUFU, TERC, TERT, TMEM127, TP53, TSC1, TSC2, VHL, WRN and WT1.   The test report has been scanned into EPIC and is located under the Molecular Pathology section of the Results Review tab.  A portion of the result report is included below for reference. Genetic testing reported out on 04/22/2022.     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  At this time, it is unknown if this variant is associated with an increased risk for cancer or if it is benign, but most uncertain variants are reclassified to benign. It should not be used to make medical management decisions. With time, we suspect the laboratory will determine the significance of this variant, if any. If the laboratory reclassifies this variant, we will attempt to contact Colleen Richard to discuss it further.   Even though a pathogenic variant was not  identified, possible explanations for the cancer in the family may include: There may be no hereditary risk for cancer in the family. The cancers in Colleen Richard and/or her family may be sporadic/familial or due to other genetic and  environmental factors. There may be a gene mutation in one of these genes that current testing methods cannot detect but that chance is small. There could be another gene that has not yet been discovered, or that we have not yet tested, that is responsible for the cancer diagnoses in the family.  It is also possible there is a hereditary cause for the cancer in the family that Colleen Richard did not inherit. The variant of uncertain significance detected in the HOXB13 gene may be reclassified as a pathogenic variant in the future. At this time, we do not know if this variant increases the risk for cancer.  Therefore, it is important to remain in touch with cancer genetics in the future so that we can continue to offer Colleen Richard   ADDITIONAL GENETIC TESTING:  We discussed with Colleen Richard that her genetic testing was fairly extensive.  If there are additional relevant genes identified to increase cancer risk that can be analyzed in the future, we would be happy to discuss and coordinate this testing at that time.    CANCER SCREENING RECOMMENDATIONS:  Colleen Richard's test result is considered negative (normal).  This means that we have not identified a hereditary cause for her personal and family history of cancer at this time.   An individual's cancer risk and medical management are not determined by genetic test results alone. Overall cancer risk assessment incorporates additional factors, including personal medical history, family history, and any available genetic information that may result in a personalized plan for cancer prevention and surveillance. Therefore, it is recommended she continue to follow the cancer management and screening guidelines provided by her oncology and primary healthcare provider.  RECOMMENDATIONS FOR FAMILY MEMBERS:   Since she did not inherit a identifiable mutation in a cancer predisposition gene included on this panel, her children could not have inherited a known  mutation from her in one of these genes. Individuals in this family might be at some increased risk of developing cancer, over the general population risk, due to the family history of cancer.  Individuals in the family should notify their providers of the family history of cancer. We recommend women in this family have a yearly mammogram beginning at age 73, or 11 years younger than the earliest onset of cancer, an annual clinical breast exam, and perform monthly breast self-exams.  Family members should have colonoscopies by at age 47, or earlier, as recommended by their providers. We do not recommend familial testing for the YYQM25 variant of uncertain significance (VUS).  FOLLOW-UP:  Lastly, we discussed with Colleen Richard that cancer genetics is a rapidly advancing field and it is possible that new genetic tests will be appropriate for her and/or her family members in the future. We encouraged her to remain in contact with cancer genetics on an annual basis so we can update her personal and family histories and let her know of advances in cancer genetics that may benefit this family.   Our contact number was provided. Colleen Richard's questions were answered to her satisfaction, and she knows she is welcome to call us at anytime with additional questions or concerns.    Faith Rogue, MS, [facility] Genetic Counselor [email or web address] Phone: [phone]

## 2022-06-01 NOTE — Progress Notes (Signed)
[place]  [phone]) [phone] Fax:(336) [phone]  ID: Colleen Richard OB: Nov 02, 1965  MR#: [id number]  LEX#:[id number]  Patient Care Team: Lavera Guise, MD as PCP - General (Internal Medicine) Theodore Demark, RN (Inactive) as Oncology Nurse Navigator Lloyd Huger, MD as Consulting Physician (Oncology) Noreene Filbert, MD as Radiation Oncologist (Radiation Oncology)   I connected with Colleen Richard on 06/02/22 at  2:45 PM EDT by video enabled telemedicine visit and verified that I am speaking with the correct person using two identifiers.   I discussed the limitations, risks, security and privacy concerns of performing an evaluation and management service by telemedicine and the availability of in-person appointments. I also discussed with the patient that there may be a patient responsible charge related to this service. The patient expressed understanding and agreed to proceed.   Other persons participating in the visit and their role in the encounter: Patient, MD.  Patient's location: Home. Provider's location: Clinic.  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  INTERVAL HISTORY: Patient agreed to video assisted telemedicine visit for routine 21-monthevaluation.  She continues to have significant hot flashes with anastrozole that disrupt sleep and occur on a daily basis. She has no neurologic complaints.  She denies any recent fevers or illnesses.  She has a good appetite and denies weight loss.  She has no chest pain, shortness of breath, cough, or hemoptysis. She denies any nausea, vomiting, constipation, or diarrhea.  She has no urinary complaints.  Patient offers no further specific complaints today.  REVIEW OF SYSTEMS:   Review of Systems  Constitutional: Negative.  Negative for diaphoresis, fever, malaise/fatigue and weight loss.  Respiratory:  Negative.  Negative for cough, hemoptysis and shortness of breath.   Cardiovascular: Negative.  Negative for chest pain and leg swelling.  Gastrointestinal: Negative.  Negative for abdominal pain.  Genitourinary: Negative.  Negative for dysuria.  Musculoskeletal: Negative.  Negative for back pain and myalgias.  Skin: Negative.  Negative for rash.  Neurological:  Positive for sensory change. Negative for dizziness, focal weakness, weakness and headaches.  Psychiatric/Behavioral: Negative.  The patient is not nervous/anxious.     As per HPI. Otherwise, a complete review of systems is negative.  PAST MEDICAL HISTORY: Past Medical History:  Diagnosis Date   Abnormal mammogram    Asthma    [place]   breast cancer on right   Menorrhagia    Migraines    Multiple thyroid nodules 12/2019   biopsied in past. 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   BREAST LUMPECTOMY WITH RADIOFREQUENCY TAG IDENTIFICATION Right 12/25/2019   Procedure: BREAST LUMPECTOMY WITH RADIOFREQUENCY TAG IDENTIFICATION;  Surgeon: RRonny Bacon MD;  [facility];  Service: General;  Laterality: Right;   CATARACT EXTRACTION, BILATERAL  01/2013, 2015   COLONOSCOPY WITH PROPOFOL N/A 11/03/2017   Procedure: COLONOSCOPY WITH PROPOFOL;  Surgeon: TVirgel Manifold MD;  [facility];  Service: Endoscopy;  Laterality: N/A;   EYE SURGERY  2018   cataract and retinal detachment   EYE SURGERY Left 08/04/2021   hole in macular and detached retina right eye   05/31/2017   PARTIAL HYSTERECTOMY  2004   SENTINEL NODE BIOPSY Right 12/25/2019   Procedure: SENTINEL NODE BIOPSY;  Surgeon: Ronny Bacon, MD;  Location: [facility] ORS;  Service: General;  Laterality: Right;   TUBAL LIGATION      FAMILY HISTORY: Family History  Problem Relation Age of Onset   COPD Mother    Asthma Mother    Atrial fibrillation Mother    Heart disease Father    Alzheimer's disease Maternal Aunt    Alzheimer's disease Maternal Grandmother    Breast cancer Other     ADVANCED DIRECTIVES (Y/N):  N  HEALTH MAINTENANCE: Social History   Tobacco Use   Smoking status: Never   Smokeless tobacco: Never  Vaping Use   Vaping Use: Never used  Substance Use Topics   Alcohol use: No   Drug use: No     Colonoscopy:  PAP:  Bone density:  Lipid panel:  No Known Allergies  Current Outpatient Medications  Medication Sig Dispense Refill   albuterol (VENTOLIN HFA) 108 (90 Base) MCG/ACT inhaler Inhale 2 puffs into the lungs every 6 (six) hours as needed for wheezing or shortness of breath. 18 g 3   anastrozole (ARIMIDEX) 1 MG tablet Take 1 tablet (1 mg total) by mouth daily. 90 tablet 3   aspirin-acetaminophen-caffeine (EXCEDRIN MIGRAINE) 250-250-65 MG tablet Take 2 tablets by mouth every 6 (six) hours as needed for headache.     CALCIUM CARBONATE-VITAMIN D PO Take 2 tablets by mouth.     ipratropium-albuterol (DUONEB) 0.5-2.5 (3) MG/3ML SOLN Take 3 mLs by nebulization every 6 (six) hours as needed. 360 mL 0   tamoxifen (NOLVADEX) 20 MG tablet Take 1 tablet (20 mg total) by mouth daily. 90 tablet 3   zolpidem (AMBIEN) 10 MG tablet Take 1 tablet (10 mg total) by mouth at bedtime as needed for sleep. 30 tablet 1   No current facility-administered medications for this visit.    OBJECTIVE: There were no vitals filed for this visit.    There is no height or weight on file to calculate BMI.    ECOG FS:0 - Asymptomatic  General: Well-developed, well-nourished, no acute  distress. HEENT: Normocephalic. Neuro: Alert, answering all questions appropriately. Cranial nerves grossly intact. Psych: Normal affect.   LAB RESULTS:  Lab Results  Component Value Date   NA 140 10/07/2021   K 4.7 10/07/2021   CL 103 10/07/2021   CO2 23 10/07/2021   GLUCOSE 92 10/07/2021   BUN 21 10/07/2021   CREATININE 0.86 10/07/2021   CALCIUM 9.4 10/07/2021   PROT 6.6 10/07/2021   ALBUMIN 4.3 10/07/2021   AST 15 10/07/2021   ALT 16 10/07/2021   ALKPHOS 85 10/07/2021   BILITOT 0.5 10/07/2021   GFRNONAA 59 (L) 11/06/2020   GFRAA 68 11/06/2020    Lab Results  Component Value Date   WBC 4.2 10/07/2021   NEUTROABS 2.4 10/07/2021   HGB 13.9 10/07/2021   HCT 41.4 10/07/2021   MCV 85 10/07/2021   PLT 248 10/07/2021     STUDIES: No results found.  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 Pathologic stage Ia ER/PR positive, HER-2 negative invasive carcinoma of  the upper outer quadrant of the right breast: Given the low stage and low-grade of patient's tumor, she did not require Oncotype testing and did not require adjuvant chemotherapy.  Patient completed adjuvant XRT.  Previously, Tatamy and LH indicated that patient is postmenopausal.  She could not tolerate letrozole and was transitioned to anastrozole.  Because of her significant hot flashes, anastrozole has been discontinued and patient was given a prescription for tamoxifen.  Patient states if she cannot tolerate tamoxifen, she will change back to anastrozole.  Her most recent mammogram on November 10, 2021 was reported as BI-RADS 2.  Repeat in February 2024.  Patient will have video-assisted telemedicine visit 2 to 3 days after her mammogram for further evaluation.     2.  Bone health: Bone mineral density on May 18, 2021 reported T score of -0.4 which is essentially unchanged from previous and continues to be  within normal limits.  Repeat bone mineral density in February 2024 along with mammogram as above. 3.  Hot flashes: Discontinue letrozole and switch to tamoxifen as above.   I provided 20 minutes of face-to-face video visit time during this encounter which included chart review, counseling, and coordination of care as documented above.   Patient expressed understanding and was in agreement with this plan. She also understands that She can call clinic at any time with any questions, concerns, or complaints.    Cancer Staging  Malignant neoplasm of upper-outer quadrant of right female breast [facility]) Staging form: Breast, AJCC 8th Edition - Clinical stage from 01/03/2020: Stage IA (cT1b, cN0, cM0, G1, ER+, PR+, HER2-) - Signed by Lloyd Huger, MD on 01/03/2020 Stage prefix: Initial diagnosis Histologic grading system: 3 grade system   Lloyd Huger, MD   06/02/2022 4:12 PM

## 2022-06-02 ENCOUNTER — Encounter: Payer: Self-pay | Admitting: Oncology

## 2022-06-02 ENCOUNTER — Inpatient Hospital Stay: Payer: 59 | Attending: Oncology | Admitting: Oncology

## 2022-06-02 DIAGNOSIS — Z170 Estrogen receptor positive status [ER+]: Secondary | ICD-10-CM | POA: Diagnosis not present

## 2022-06-02 DIAGNOSIS — C50411 Malignant neoplasm of upper-outer quadrant of right female breast: Secondary | ICD-10-CM

## 2022-06-02 DIAGNOSIS — Z79811 Long term (current) use of aromatase inhibitors: Secondary | ICD-10-CM

## 2022-06-02 MED ORDER — TAMOXIFEN CITRATE 20 MG PO TABS: 20.0000 mg | ORAL_TABLET | Freq: Every day | ORAL | 3 refills | Status: DC

## 2022-07-02 ENCOUNTER — Encounter: Payer: Self-pay | Admitting: Nurse Practitioner

## 2022-07-17 ENCOUNTER — Encounter: Payer: Self-pay | Admitting: Oncology

## 2022-07-21 ENCOUNTER — Other Ambulatory Visit: Payer: Self-pay | Admitting: Nurse Practitioner

## 2022-07-21 DIAGNOSIS — F5101 Primary insomnia: Secondary | ICD-10-CM

## 2022-07-22 NOTE — Telephone Encounter (Signed)
Last 8/23 and next 2/24

## 2022-07-22 NOTE — Telephone Encounter (Signed)
Med sent to pharmacy.

## 2022-08-09 ENCOUNTER — Encounter: Payer: Self-pay | Admitting: Nurse Practitioner

## 2022-08-10 ENCOUNTER — Ambulatory Visit (INDEPENDENT_AMBULATORY_CARE_PROVIDER_SITE_OTHER): Payer: 59 | Admitting: Nurse Practitioner

## 2022-08-10 ENCOUNTER — Ambulatory Visit
Admission: RE | Admit: 2022-08-10 | Discharge: 2022-08-10 | Disposition: A | Discharge status: Home / Self Care | Payer: 59 | Source: Ambulatory Visit | Attending: Nurse Practitioner | Admitting: Nurse Practitioner

## 2022-08-10 ENCOUNTER — Encounter: Payer: Self-pay | Admitting: Nurse Practitioner

## 2022-08-10 VITALS — BP 163/96 | HR 90 | Temp 90.0°F | Resp 16 | Ht 69.0 in | Wt 228.2 lb

## 2022-08-10 DIAGNOSIS — I803 Phlebitis and thrombophlebitis of lower extremities, unspecified: Secondary | ICD-10-CM | POA: Diagnosis present

## 2022-08-10 DIAGNOSIS — I82812 Embolism and thrombosis of superficial veins of left lower extremities: Secondary | ICD-10-CM

## 2022-08-10 NOTE — Progress Notes (Signed)
[facility] [place], [place] [address]  Internal MEDICINE  Office Visit Note  Patient Name: Colleen Richard  [id number]  [id number]  Date of Service: 08/10/2022  Chief Complaint  Patient presents with   Acute Visit    Dvt left leg    HPI Colleen Richard presents for an acute visit for possible blood clot in the left leg.  She has a varicose vein that is dilated, firm, tender and swollen. There is redness at the site as well but no bleeding or broken skin.     Current Medication: Outpatient Encounter Medications as of 08/10/2022  Medication Sig Note   albuterol (VENTOLIN HFA) 108 (90 Base) MCG/ACT inhaler Inhale 2 puffs into the lungs every 6 (six) hours as needed for wheezing or shortness of breath.    anastrozole (ARIMIDEX) 1 MG tablet Take 1 tablet (1 mg total) by mouth daily.    aspirin-acetaminophen-caffeine (EXCEDRIN MIGRAINE) 250-250-65 MG tablet Take 2 tablets by mouth every 6 (six) hours as needed for headache.    CALCIUM CARBONATE-VITAMIN D PO Take 2 tablets by mouth.    ipratropium-albuterol (DUONEB) 0.5-2.5 (3) MG/3ML SOLN Take 3 mLs by nebulization every 6 (six) hours as needed. 05/03/2022: Do not send any more refills unless patient asks, she has many   tamoxifen (NOLVADEX) 20 MG tablet Take 1 tablet (20 mg total) by mouth daily.    zolpidem (AMBIEN) 10 MG tablet TAKE 1 TABLET BY MOUTH AT BEDTIME AS NEEDED FOR SLEEP *NEED APPOINTMENT FOR FURTHER FILLS*    [DISCONTINUED] fondaparinux (ARIXTRA) 2.5 MG/0.5ML SOLN injection Inject 0.5 mLs (2.5 mg total) into the skin daily.    fondaparinux (ARIXTRA) 2.5 MG/0.5ML SOLN injection Inject 0.5 mLs (2.5 mg total) into the skin daily.    No facility-administered encounter medications on file as of 08/10/2022.    Surgical History: Past Surgical History:  Procedure Laterality Date   ABDOMINAL HYSTERECTOMY  2004   BREAST BIOPSY Right 08/26/2015   stereo, top hat clip    BREAST BIOPSY Right 12/10/2019    affirm, x marker,[facility]   BREAST EXCISIONAL BIOPSY Bilateral 2010   neg   BREAST LUMPECTOMY Right 12/25/2019   [facility], low grade DCIS, Negative LNs   BREAST LUMPECTOMY WITH RADIOFREQUENCY TAG IDENTIFICATION Right 12/25/2019   Procedure: BREAST LUMPECTOMY WITH RADIOFREQUENCY TAG IDENTIFICATION;  Surgeon: Ronny Bacon, MD;  Location: [facility] ORS;  Service: General;  Laterality: Right;   CATARACT EXTRACTION, BILATERAL  01/2013, 2015   COLONOSCOPY WITH PROPOFOL N/A 11/03/2017   Procedure: COLONOSCOPY WITH PROPOFOL;  Surgeon: Virgel Manifold, MD;  Location: [facility] ENDOSCOPY;  Service: Endoscopy;  Laterality: N/A;   EYE SURGERY  2018   cataract and retinal detachment   EYE SURGERY Left 08/04/2021   hole in macular and detached retina right eye  05/31/2017   PARTIAL HYSTERECTOMY  2004   SENTINEL NODE BIOPSY Right 12/25/2019   Procedure: SENTINEL NODE BIOPSY;  Surgeon: Ronny Bacon, MD;  Location: [facility] ORS;  Service: General;  Laterality: Right;   TUBAL LIGATION      Medical History: Past Medical History:  Diagnosis Date   Abnormal mammogram    Asthma    Cancer ([place]) 12/2019   breast cancer on right   Menorrhagia    Migraines    Multiple thyroid nodules 12/2019   biopsied in past. now just following with yearly ultrasounds   Personal history of radiation therapy 2021   right breast ca   Reflux esophagitis     Family History:  Family History  Problem Relation Age of Onset   COPD Mother    Asthma Mother    Atrial fibrillation Mother    Heart disease Father    Alzheimer's disease Maternal Aunt    Alzheimer's disease Maternal Grandmother    Breast cancer Other     Social History   Socioeconomic History   Marital status: Married    Spouse name: Linwood   Number of children: Not on file   Years of education: Not on file   Highest education level: Not on file  Occupational History   Not on file  Tobacco Use   Smoking status: Never   Smokeless tobacco: Never  Vaping Use    Vaping Use: Never used  Substance and Sexual Activity   Alcohol use: No   Drug use: No   Sexual activity: Yes  Other Topics Concern   Not on file  Social History Narrative   Patient lives husband in her home.   Social Determinants of Health   Financial Resource Strain: Not on file  Food Insecurity: Not on file  Transportation Needs: Not on file  Physical Activity: Not on file  Stress: Not on file  Social Connections: Not on file  Intimate Partner Violence: Not on file      Review of Systems  Constitutional: Negative.  Negative for fatigue.  HENT: Negative.    Respiratory: Negative.  Negative for cough, chest tightness, shortness of breath and wheezing.   Cardiovascular:  Positive for leg swelling. Negative for chest pain and palpitations.  Musculoskeletal:        Left leg pain, localized  Skin:        Left lower extremity redness, tenderness and pain that is constant and worsens with movement.  Neurological: Negative.   Psychiatric/Behavioral: Negative.      Vital Signs: BP (!) 163/96   Pulse 90   Temp (!) 90 F (32.2 C)   Resp 16   Ht '5\' 9"'$  (1.753 m)   Wt 228 lb 3.2 oz (103.5 kg)   SpO2 99%   BMI 33.70 kg/m    Physical Exam Vitals reviewed.  Constitutional:      General: She is not in acute distress.    Appearance: Normal appearance. She is obese. She is not ill-appearing.  HENT:     Head: Normocephalic and atraumatic.  Eyes:     Pupils: Pupils are equal, round, and reactive to light.  Cardiovascular:     Rate and Rhythm: Normal rate and regular rhythm.  Pulmonary:     Effort: Pulmonary effort is normal. No respiratory distress.  Musculoskeletal:        General: Swelling and tenderness present.     Left lower leg: Edema present.       Legs:     Comments: Area where a blood clot looks to have formed--dilated varicose vein with redness, tenderness, firm palpated area of possible clot. Pain with walking and at rest localized to that area.   Skin:     Capillary Refill: Capillary refill takes less than 2 seconds.     Findings: Erythema present.  Neurological:     Mental Status: She is alert and oriented to person, place, and time.  Psychiatric:        Mood and Affect: Mood normal.        Behavior: Behavior normal.        Assessment/Plan: 1. Acute superficial venous thrombosis of left lower extremity Arixtra injections ordered 2.5 mg daily x45 days.  -  fondaparinux (ARIXTRA) 2.5 MG/0.5ML SOLN injection; Inject 0.5 mLs (2.5 mg total) into the skin daily.  Dispense: 22.5 mL; Refill: 0  2. Thrombophlebitis of leg Stat ultrasound of left lower extremity showed an SVT (superficial venous thrombosis) of the left lower extremity, see problem #1 for prescribed treatment regimen.  - US Venous Img Lower Unilateral Left (DVT); Future   General Counseling: emalene welte understanding of the findings of todays visit and agrees with plan of treatment. I have discussed any further diagnostic evaluation that may be needed or ordered today. We also reviewed her medications today. she has been encouraged to call the office with any questions or concerns that should arise related to todays visit.    Orders Placed This Encounter  Procedures   US Venous Img Lower Unilateral Left (DVT)    Meds ordered this encounter  Medications   DISCONTD: fondaparinux (ARIXTRA) 2.5 MG/0.5ML SOLN injection    Sig: Inject 0.5 mLs (2.5 mg total) into the skin daily.    Dispense:  22.5 mL    Refill:  0   fondaparinux (ARIXTRA) 2.5 MG/0.5ML SOLN injection    Sig: Inject 0.5 mLs (2.5 mg total) into the skin daily.    Dispense:  22.5 mL    Refill:  0    Please fill ASAP, she has an acute superficial venous thrombosis and FDA approved treatment is 45 days of fondaparinux injections, send prior auth ASAP if required.    Return for sent for stat ultrasound of left lower extremity to rule out DVT. will call with results. .   Total time spent:20 Minutes Time  spent includes review of chart, medications, test results, and follow up plan with the patient.   [place] Controlled Substance Database was reviewed by me.  This patient was seen by Jonetta Osgood, FNP-C in collaboration with Dr. Clayborn Bigness as a part of collaborative care agreement.   Seraphina Mitchner R. Valetta Fuller, MSN, FNP-C Internal medicine

## 2022-08-10 NOTE — Telephone Encounter (Signed)
Patient scheduled for STAT US to rule out dvt on 08/10/22 @ 3:30 kp.tat

## 2022-08-11 MED ORDER — FONDAPARINUX SODIUM 2.5 MG/0.5ML ~~LOC~~ SOLN: 2.5000 mg | SUBCUTANEOUS | 0 refills | Status: DC

## 2022-08-12 ENCOUNTER — Telehealth: Payer: Self-pay | Admitting: Nurse Practitioner

## 2022-08-12 ENCOUNTER — Encounter: Payer: Self-pay | Admitting: Nurse Practitioner

## 2022-08-12 NOTE — Telephone Encounter (Signed)
[name] with [facility] pharmacy lvm on after-hour line stating they have to order Arixtra and won't be in until Monday. Per AA, that will be ok, and patient is to take 325 mg tablet of aspirin daily until then. I notified pharmacy and patient-Colleen Richard

## 2022-09-20 ENCOUNTER — Encounter: Payer: Self-pay | Admitting: Nurse Practitioner

## 2022-09-20 DIAGNOSIS — E782 Mixed hyperlipidemia: Secondary | ICD-10-CM

## 2022-09-20 DIAGNOSIS — E040 Nontoxic diffuse goiter: Secondary | ICD-10-CM

## 2022-09-20 DIAGNOSIS — E559 Vitamin D deficiency, unspecified: Secondary | ICD-10-CM

## 2022-09-29 LAB — CBC WITH DIFFERENTIAL/PLATELET
Basophils Absolute: 0 10*3/uL (ref 0.0–0.2)
Basos: 1 %
EOS (ABSOLUTE): 0.1 10*3/uL (ref 0.0–0.4)
Eos: 3 %
Hematocrit: 42 % (ref 34.0–46.6)
Hemoglobin: 14.3 g/dL (ref 11.1–15.9)
Immature Grans (Abs): 0 10*3/uL (ref 0.0–0.1)
Immature Granulocytes: 0 %
Lymphocytes Absolute: 1.3 10*3/uL (ref 0.7–3.1)
Lymphs: 34 %
MCH: 28.9 pg (ref 26.6–33.0)
MCHC: 34 g/dL (ref 31.5–35.7)
MCV: 85 fL (ref 79–97)
Monocytes Absolute: 0.3 10*3/uL (ref 0.1–0.9)
Monocytes: 7 %
Neutrophils Absolute: 2.1 10*3/uL (ref 1.4–7.0)
Neutrophils: 55 %
Platelets: 220 10*3/uL (ref 150–450)
RBC: 4.95 x10E6/uL (ref 3.77–5.28)
RDW: 13 % (ref 11.7–15.4)
WBC: 3.8 10*3/uL (ref 3.4–10.8)

## 2022-09-29 LAB — CMP14+EGFR
ALT: 18 IU/L (ref 0–32)
AST: 20 IU/L (ref 0–40)
Albumin/Globulin Ratio: 2 (ref 1.2–2.2)
Albumin: 4.5 g/dL (ref 3.8–4.9)
Alkaline Phosphatase: 78 IU/L (ref 44–121)
BUN/Creatinine Ratio: 16 (ref 9–23)
BUN: 16 mg/dL (ref 6–24)
Bilirubin Total: 0.5 mg/dL (ref 0.0–1.2)
CO2: 21 mmol/L (ref 20–29)
Calcium: 9.5 mg/dL (ref 8.7–10.2)
Chloride: 104 mmol/L (ref 96–106)
Creatinine, Ser: 0.97 mg/dL (ref 0.57–1.00)
Globulin, Total: 2.3 g/dL (ref 1.5–4.5)
Glucose: 97 mg/dL (ref 70–99)
Potassium: 4.2 mmol/L (ref 3.5–5.2)
Sodium: 140 mmol/L (ref 134–144)
Total Protein: 6.8 g/dL (ref 6.0–8.5)
eGFR: 69 mL/min/{1.73_m2} (ref >59)

## 2022-09-29 LAB — LIPID PANEL
Chol/HDL Ratio: 3.9 ratio (ref 0.0–4.4)
Cholesterol, Total: 231 mg/dL — ABNORMAL HIGH (ref 100–199)
HDL: 59 mg/dL (ref >39)
LDL Chol Calc (NIH): 156 mg/dL — ABNORMAL HIGH (ref 0–99)
Triglycerides: 91 mg/dL (ref 0–149)
VLDL Cholesterol Cal: 16 mg/dL (ref 5–40)

## 2022-09-29 LAB — VITAMIN D 25 HYDROXY (VIT D DEFICIENCY, FRACTURES): Vit D, 25-Hydroxy: 24.6 ng/mL — ABNORMAL LOW (ref 30.0–100.0)

## 2022-09-29 LAB — TSH+FREE T4
Free T4: 1.28 ng/dL (ref 0.82–1.77)
TSH: 1.05 u[IU]/mL (ref 0.450–4.500)

## 2022-10-25 ENCOUNTER — Encounter: Payer: 59 | Admitting: Nurse Practitioner

## 2022-10-25 NOTE — Progress Notes (Signed)
We will discuss the lab results at her upcoming office visit this month.

## 2022-11-07 ENCOUNTER — Telehealth: Payer: 59 | Admitting: Physician Assistant

## 2022-11-07 ENCOUNTER — Encounter: Payer: 59 | Admitting: Nurse Practitioner

## 2022-11-07 DIAGNOSIS — B9689 Other specified bacterial agents as the cause of diseases classified elsewhere: Secondary | ICD-10-CM | POA: Diagnosis not present

## 2022-11-07 DIAGNOSIS — J019 Acute sinusitis, unspecified: Secondary | ICD-10-CM | POA: Diagnosis not present

## 2022-11-07 MED ORDER — DOXYCYCLINE HYCLATE 100 MG PO TABS: 100.0000 mg | ORAL_TABLET | Freq: Two times a day (BID) | ORAL | 0 refills | Status: DC

## 2022-11-07 MED ORDER — FLUTICASONE PROPIONATE 50 MCG/ACT NA SUSP: 2.0000 | Freq: Every day | NASAL | 0 refills | Status: DC

## 2022-11-07 NOTE — Patient Instructions (Signed)
[name], thank you for joining Leeanne Rio, PA-C for today's virtual visit.  While this provider is not your primary care provider (PCP), if your PCP is located in our provider database this encounter information will be shared with them immediately following your visit.   [facility] account gives you access to today's visit and all your visits, tests, and labs performed at [facility] " click here if you don't have a [facility] account or go to [email or web address]  Consent: (Patient) Colleen Richard provided verbal consent for this virtual visit at the beginning of the encounter.  Current Medications:  Current Outpatient Medications:    albuterol (VENTOLIN HFA) 108 (90 Base) MCG/ACT inhaler, Inhale 2 puffs into the lungs every 6 (six) hours as needed for wheezing or shortness of breath., Disp: 18 g, Rfl: 3   anastrozole (ARIMIDEX) 1 MG tablet, Take 1 tablet (1 mg total) by mouth daily., Disp: 90 tablet, Rfl: 3   aspirin-acetaminophen-caffeine (EXCEDRIN MIGRAINE) 250-250-65 MG tablet, Take 2 tablets by mouth every 6 (six) hours as needed for headache., Disp: , Rfl:    CALCIUM CARBONATE-VITAMIN D PO, Take 2 tablets by mouth., Disp: , Rfl:    fondaparinux (ARIXTRA) 2.5 MG/0.5ML SOLN injection, Inject 0.5 mLs (2.5 mg total) into the skin daily., Disp: 22.5 mL, Rfl: 0   ipratropium-albuterol (DUONEB) 0.5-2.5 (3) MG/3ML SOLN, Take 3 mLs by nebulization every 6 (six) hours as needed., Disp: 360 mL, Rfl: 0   tamoxifen (NOLVADEX) 20 MG tablet, Take 1 tablet (20 mg total) by mouth daily., Disp: 90 tablet, Rfl: 3   zolpidem (AMBIEN) 10 MG tablet, TAKE 1 TABLET BY MOUTH AT BEDTIME AS NEEDED FOR SLEEP *NEED APPOINTMENT FOR FURTHER FILLS*, Disp: 30 tablet, Rfl: 2   Medications ordered in this encounter:  No orders of the defined types were placed in this encounter.    *If you need refills on other medications prior to your next appointment, please  contact your pharmacy*  Follow-Up: Call back or seek an in-person evaluation if the symptoms worsen or if the condition fails to improve as anticipated.  [place] [phone]  Other Instructions Please take antibiotic as directed.  Increase fluid intake.  Use Saline nasal spray.  Take a daily multivitamin. Use the Flonase as directed.  Place a humidifier in the bedroom.  Please call or return clinic if symptoms are not improving.  Sinusitis Sinusitis is redness, soreness, and swelling (inflammation) of the paranasal sinuses. Paranasal sinuses are air pockets within the bones of your face (beneath the eyes, the middle of the forehead, or above the eyes). In healthy paranasal sinuses, mucus is able to drain out, and air is able to circulate through them by way of your nose. However, when your paranasal sinuses are inflamed, mucus and air can become trapped. This can allow bacteria and other germs to grow and cause infection. Sinusitis can develop quickly and last only a short time (acute) or continue over a long period (chronic). Sinusitis that lasts for more than 12 weeks is considered chronic.  CAUSES  Causes of sinusitis include: Allergies. Structural abnormalities, such as displacement of the cartilage that separates your nostrils (deviated septum), which can decrease the air flow through your nose and sinuses and affect sinus drainage. Functional abnormalities, such as when the small hairs (cilia) that line your sinuses and help remove mucus do not work properly or are not present. SYMPTOMS  Symptoms of acute and chronic sinusitis  are the same. The primary symptoms are pain and pressure around the affected sinuses. Other symptoms include: Upper toothache. Earache. Headache. Bad breath. Decreased sense of smell and taste. A cough, which worsens when you are lying flat. Fatigue. Fever. Thick drainage from your nose, which often is green and may contain pus  (purulent). Swelling and warmth over the affected sinuses. DIAGNOSIS  Your caregiver will perform a physical exam. During the exam, your caregiver may: Look in your nose for signs of abnormal growths in your nostrils (nasal polyps). Tap over the affected sinus to check for signs of infection. View the inside of your sinuses (endoscopy) with a special imaging device with a light attached (endoscope), which is inserted into your sinuses. If your caregiver suspects that you have chronic sinusitis, one or more of the following tests may be recommended: Allergy tests. Nasal culture A sample of mucus is taken from your nose and sent to a lab and screened for bacteria. Nasal cytology A sample of mucus is taken from your nose and examined by your caregiver to determine if your sinusitis is related to an allergy. TREATMENT  Most cases of acute sinusitis are related to a viral infection and will resolve on their own within 10 days. Sometimes medicines are prescribed to help relieve symptoms (pain medicine, decongestants, nasal steroid sprays, or saline sprays).  However, for sinusitis related to a bacterial infection, your caregiver will prescribe antibiotic medicines. These are medicines that will help kill the bacteria causing the infection.  Rarely, sinusitis is caused by a fungal infection. In theses cases, your caregiver will prescribe antifungal medicine. For some cases of chronic sinusitis, surgery is needed. Generally, these are cases in which sinusitis recurs more than 3 times per year, despite other treatments. HOME CARE INSTRUCTIONS  Drink plenty of water. Water helps thin the mucus so your sinuses can drain more easily. Use a humidifier. Inhale steam 3 to 4 times a day (for example, sit in the bathroom with the shower running). Apply a warm, moist washcloth to your face 3 to 4 times a day, or as directed by your caregiver. Use saline nasal sprays to help moisten and clean your sinuses. Take  over-the-counter or prescription medicines for pain, discomfort, or fever only as directed by your caregiver. SEEK IMMEDIATE MEDICAL CARE IF: You have increasing pain or severe headaches. You have nausea, vomiting, or drowsiness. You have swelling around your face. You have vision problems. You have a stiff neck. You have difficulty breathing. MAKE SURE YOU:  Understand these instructions. Will watch your condition. Will get help right away if you are not doing well or get worse. Document Released: 09/05/2005 Document Revised: 11/28/2011 Document Reviewed: 09/20/2011 [facility] Patient Information 2014 [place], [place].    If you have been instructed to have an in-person evaluation today at a local Urgent Care facility, please use the link below. It will take you to a list of all of our available [facility], including address, phone number and hours of operation. Please do not delay care.  [facility]  If you or a family member do not have a primary care provider, use the link below to schedule a visit and establish care. When you choose a [facility] primary care physician or advanced practice provider, you gain a long-term partner in health. Find a Primary Care Provider  Learn more about 's in-office and virtual care options: [facility]

## 2022-11-07 NOTE — Progress Notes (Signed)
Virtual Visit Consent   Colleen Richard, you are scheduled for a virtual visit with a [facility] provider today. Just as with appointments in the office, your consent must be obtained to participate. Your consent will be active for this visit and any virtual visit you may have with one of our providers in the next 365 days. If you have a MyChart account, a copy of this consent can be sent to you electronically.  As this is a virtual visit, video technology does not allow for your provider to perform a traditional examination. This may limit your provider's ability to fully assess your condition. If your provider identifies any concerns that need to be evaluated in person or the need to arrange testing (such as labs, EKG, etc.), we will make arrangements to do so. Although advances in technology are sophisticated, we cannot ensure that it will always work on either your end or our end. If the connection with a video visit is poor, the visit may have to be switched to a telephone visit. With either a video or telephone visit, we are not always able to ensure that we have a secure connection.  By engaging in this virtual visit, you consent to the provision of healthcare and authorize for your insurance to be billed (if applicable) for the services provided during this visit. Depending on your insurance coverage, you may receive a charge related to this service.  I need to obtain your verbal consent now. Are you willing to proceed with your visit today? Colleen Richard has provided verbal consent on 11/07/2022 for a virtual visit (video or telephone). Leeanne Rio, [place]  Date: 11/07/2022 7:50 AM  Virtual Visit via Video Note   I, Leeanne Rio, connected with  Colleen Richard  (MD:[id number], 1966-05-01) on 11/07/22 at  7:45 AM EST by a video-enabled telemedicine application and verified that I am speaking with the correct person using two identifiers.  Location: Patient: Virtual Visit Location  Patient: Home Provider: Virtual Visit Location Provider: Home Office   I discussed the limitations of evaluation and management by telemedicine and the availability of in person appointments. The patient expressed understanding and agreed to proceed.    History of Present Illness: Colleen Richard is a 57 y.o. who identifies as a female who was assigned female at birth, and is being seen today for  5 days of ongoing and progressing nasal and sinus congestion, sinus pressure, thick drainage, sinus pain and facial pain. Now drainage has turned from clear to dark yellow. Pain in ears is bilateral and constant. No fever, chills, aches. OTC Theraflu, D3, zinc. Has not tested for COVID. Has been working from home and giving medical history of cancer, she tries to keep away from crowds, etc.  HPI: HPI  Problems:  Patient Active Problem List   Diagnosis Date Noted   Genetic testing 05/03/2022   Malignant neoplasm of upper-outer quadrant of right female breast ([place]) 12/14/2019   Encounter for general adult medical examination with abnormal findings 10/23/2019   Mild intermittent asthma without complication 123456   BMI 29.0-29.9,adult 10/23/2019   Dysuria 10/23/2019   Migraines 10/09/2018   Body mass index (bmi) 31.0-31.9, adult 10/09/2018   Acute non-recurrent pansinusitis 08/08/2018   Goiter diffuse, nontoxic 05/13/2018   Menopausal and perimenopausal disorder 05/13/2018   Vitamin D deficiency 05/13/2018   Primary osteoarthritis of right shoulder 05/13/2018   Obesity (BMI 30.0-34.9) 12/19/2017   DDD (degenerative disc disease), cervical 12/19/2017  Screening for breast cancer    Benign neoplasm of ascending colon    Rectal inflammation    Acne 11/20/2012   Bronchospasm 11/20/2012   Family history of ischemic heart disease 11/20/2012   Insomnia 11/20/2012    Allergies:  Allergies  Allergen Reactions   Amoxicillin Nausea And Vomiting   Medications:  Current Outpatient Medications:     doxycycline (VIBRA-TABS) 100 MG tablet, Take 1 tablet (100 mg total) by mouth 2 (two) times daily., Disp: 20 tablet, Rfl: 0   fluticasone (FLONASE) 50 MCG/ACT nasal spray, Place 2 sprays into both nostrils daily., Disp: 16 g, Rfl: 0   albuterol (VENTOLIN HFA) 108 (90 Base) MCG/ACT inhaler, Inhale 2 puffs into the lungs every 6 (six) hours as needed for wheezing or shortness of breath., Disp: 18 g, Rfl: 3   anastrozole (ARIMIDEX) 1 MG tablet, Take 1 tablet (1 mg total) by mouth daily., Disp: 90 tablet, Rfl: 3   aspirin-acetaminophen-caffeine (EXCEDRIN MIGRAINE) 250-250-65 MG tablet, Take 2 tablets by mouth every 6 (six) hours as needed for headache., Disp: , Rfl:    CALCIUM CARBONATE-VITAMIN D PO, Take 2 tablets by mouth., Disp: , Rfl:    ipratropium-albuterol (DUONEB) 0.5-2.5 (3) MG/3ML SOLN, Take 3 mLs by nebulization every 6 (six) hours as needed., Disp: 360 mL, Rfl: 0   zolpidem (AMBIEN) 10 MG tablet, TAKE 1 TABLET BY MOUTH AT BEDTIME AS NEEDED FOR SLEEP *NEED APPOINTMENT FOR FURTHER FILLS*, Disp: 30 tablet, Rfl: 2  Observations/Objective: Patient is well-developed, well-nourished in no acute distress.  Resting comfortably at home.  Head is normocephalic, atraumatic.  No labored breathing. Speech is clear and coherent with logical content.  Patient is alert and oriented at baseline.   Assessment and Plan: 1. Acute bacterial sinusitis - doxycycline (VIBRA-TABS) 100 MG tablet; Take 1 tablet (100 mg total) by mouth 2 (two) times daily.  Dispense: 20 tablet; Refill: 0 - fluticasone (FLONASE) 50 MCG/ACT nasal spray; Place 2 sprays into both nostrils daily.  Dispense: 16 g; Refill: 0  Giving medical history,will have her COVID test to be cautious. She will let us know if positive. Rx Doxycycline.  Increase fluids.  Rest.  Saline nasal spray.  Probiotic.  Mucinex as directed.  Humidifier in bedroom. Flonase per orders.  Call or return to clinic if symptoms are not improving.   Follow Up  Instructions: I discussed the assessment and treatment plan with the patient. The patient was provided an opportunity to ask questions and all were answered. The patient agreed with the plan and demonstrated an understanding of the instructions.  A copy of instructions were sent to the patient via MyChart unless otherwise noted below.   The patient was advised to call back or seek an in-person evaluation if the symptoms worsen or if the condition fails to improve as anticipated.  Time:  I spent 10 minutes with the patient via telehealth technology discussing the above problems/concerns.    Leeanne Rio, PA-C

## 2022-11-10 ENCOUNTER — Encounter: Payer: Self-pay | Admitting: Nurse Practitioner

## 2022-11-10 ENCOUNTER — Ambulatory Visit (INDEPENDENT_AMBULATORY_CARE_PROVIDER_SITE_OTHER): Payer: 59 | Admitting: Nurse Practitioner

## 2022-11-10 ENCOUNTER — Telehealth: Payer: Self-pay

## 2022-11-10 ENCOUNTER — Other Ambulatory Visit: Payer: Self-pay

## 2022-11-10 VITALS — BP 140/82 | HR 85 | Temp 97.4°F | Resp 16 | Ht 69.0 in | Wt 228.6 lb

## 2022-11-10 DIAGNOSIS — R300 Dysuria: Secondary | ICD-10-CM | POA: Diagnosis not present

## 2022-11-10 DIAGNOSIS — Z0001 Encounter for general adult medical examination with abnormal findings: Secondary | ICD-10-CM | POA: Diagnosis not present

## 2022-11-10 DIAGNOSIS — Z1212 Encounter for screening for malignant neoplasm of rectum: Secondary | ICD-10-CM

## 2022-11-10 DIAGNOSIS — Z1211 Encounter for screening for malignant neoplasm of colon: Secondary | ICD-10-CM

## 2022-11-10 DIAGNOSIS — Z77128 Contact with and (suspected) exposure to other hazards in the physical environment: Secondary | ICD-10-CM | POA: Diagnosis not present

## 2022-11-10 DIAGNOSIS — Z86010 Personal history of colonic polyps: Secondary | ICD-10-CM

## 2022-11-10 DIAGNOSIS — F5101 Primary insomnia: Secondary | ICD-10-CM

## 2022-11-10 DIAGNOSIS — E782 Mixed hyperlipidemia: Secondary | ICD-10-CM

## 2022-11-10 MED ORDER — ZOLPIDEM TARTRATE 10 MG PO TABS: ORAL_TABLET | ORAL | 2 refills | Status: DC

## 2022-11-10 MED ORDER — NA SULFATE-K SULFATE-MG SULF 17.5-3.13-1.6 GM/177ML PO SOLN: 1.0000 | Freq: Once | ORAL | 0 refills | Status: AC

## 2022-11-10 MED ORDER — ROSUVASTATIN CALCIUM 5 MG PO TABS: 5.0000 mg | ORAL_TABLET | Freq: Every day | ORAL | 1 refills | Status: DC

## 2022-11-10 NOTE — Telephone Encounter (Signed)
Gastroenterology Pre-Procedure Review  Request Date: 12/01/22 Requesting Physician: Dr. Marius Ditch  PATIENT REVIEW QUESTIONS: The patient responded to the following health history questions as indicated:    1. Are you having any GI issues? no 2. Do you have a personal history of Polyps? yes (last colonoscopy performed by Dr. Bonna Gains 11/03/17 polyps were noted) 3. Do you have a family history of Colon Cancer or Polyps? yes (mother had intestinal problem that caused her to bowel resection) 4. Diabetes Mellitus? no 5. Joint replacements in the past 12 months?no 6. Major health problems in the past 3 months?no 7. Any artificial heart valves, MVP, or defibrillator?no    MEDICATIONS & ALLERGIES:    Patient reports the following regarding taking any anticoagulation/antiplatelet therapy:   Plavix, Coumadin, Eliquis, Xarelto, Lovenox, Pradaxa, Brilinta, or Effient? no Aspirin? no  Patient confirms/reports the following medications:  Current Outpatient Medications  Medication Sig Dispense Refill   albuterol (VENTOLIN HFA) 108 (90 Base) MCG/ACT inhaler Inhale 2 puffs into the lungs every 6 (six) hours as needed for wheezing or shortness of breath. 18 g 3   anastrozole (ARIMIDEX) 1 MG tablet Take 1 tablet (1 mg total) by mouth daily. 90 tablet 3   aspirin-acetaminophen-caffeine (EXCEDRIN MIGRAINE) 250-250-65 MG tablet Take 2 tablets by mouth every 6 (six) hours as needed for headache.     CALCIUM CARBONATE-VITAMIN D PO Take 2 tablets by mouth.     doxycycline (VIBRA-TABS) 100 MG tablet Take 1 tablet (100 mg total) by mouth 2 (two) times daily. 20 tablet 0   fluticasone (FLONASE) 50 MCG/ACT nasal spray Place 2 sprays into both nostrils daily. 16 g 0   ipratropium-albuterol (DUONEB) 0.5-2.5 (3) MG/3ML SOLN Take 3 mLs by nebulization every 6 (six) hours as needed. 360 mL 0   rosuvastatin (CRESTOR) 5 MG tablet Take 1 tablet (5 mg total) by mouth daily. 90 tablet 1   zolpidem (AMBIEN) 10 MG tablet TAKE 1  TABLET BY MOUTH AT BEDTIME AS NEEDED FOR SLEEP *NEED APPOINTMENT FOR FURTHER FILLS* 30 tablet 2   No current facility-administered medications for this visit.    Patient confirms/reports the following allergies:  Allergies  Allergen Reactions   Amoxicillin Nausea And Vomiting    No orders of the defined types were placed in this encounter.   AUTHORIZATION INFORMATION Primary Insurance: 1D#: Group #:  Secondary Insurance: 1D#: Group #:  SCHEDULE INFORMATION: Date: 12/01/22 Time: Location: [facility]

## 2022-11-10 NOTE — Progress Notes (Cosign Needed Addendum)
[facility] [place], [place] [address]  Internal MEDICINE  Office Visit Note  Patient Name: Colleen Richard  [id number]  [id number]  Date of Service: 11/14/2022  Chief Complaint  Patient presents with   Annual Exam    HPI Britt presents for an annual well visit and physical exam.  Well-appearing 57 y.o. female with insomnia, migraines, asthma, goiter, and a history of breast cancer.  Routine CRC screening: due soon, wants to see GI specialist.  Routine mammogram: scheduled for 11/14/22 DEXA scan: scheduled for 11/14/22 Labs: discussed routine labs -- LDL is elevated at 156. 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     Current Medication: Outpatient Encounter Medications as of 11/10/2022  Medication Sig Note   albuterol (VENTOLIN HFA) 108 (90 Base) MCG/ACT inhaler Inhale 2 puffs into the lungs every 6 (six) hours as needed for wheezing or shortness of breath.    anastrozole (ARIMIDEX) 1 MG tablet Take 1 tablet (1 mg total) by mouth daily.    aspirin-acetaminophen-caffeine (EXCEDRIN MIGRAINE) 250-250-65 MG tablet Take 2 tablets by mouth every 6 (six) hours as needed for headache.    CALCIUM CARBONATE-VITAMIN D PO Take 2 tablets by mouth.    doxycycline (VIBRA-TABS) 100 MG tablet Take 1 tablet (100 mg total) by mouth 2 (two) times daily.    fluticasone (FLONASE) 50 MCG/ACT nasal spray Place 2 sprays into both nostrils daily.    ipratropium-albuterol (DUONEB) 0.5-2.5 (3) MG/3ML SOLN Take 3 mLs by nebulization every 6 (six) hours as needed. 05/03/2022: Do not send any more refills unless patient asks, she has many   rosuvastatin (CRESTOR) 5 MG tablet Take 1 tablet (5 mg total) by mouth daily.    [DISCONTINUED] zolpidem (AMBIEN) 10 MG tablet TAKE 1 TABLET BY MOUTH AT BEDTIME AS NEEDED FOR SLEEP *NEED APPOINTMENT FOR FURTHER FILLS*    zolpidem (AMBIEN) 10 MG tablet  TAKE 1 TABLET BY MOUTH AT BEDTIME AS NEEDED FOR SLEEP *NEED APPOINTMENT FOR FURTHER FILLS*    [DISCONTINUED] zolpidem (AMBIEN) 10 MG tablet TAKE 1 TABLET BY MOUTH AT BEDTIME AS NEEDED FOR SLEEP *NEED APPOINTMENT FOR FURTHER FILLS*    No facility-administered encounter medications on file as of 11/10/2022.    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   BREAST LUMPECTOMY WITH RADIOFREQUENCY TAG IDENTIFICATION Right 12/25/2019   Procedure: BREAST LUMPECTOMY WITH RADIOFREQUENCY TAG IDENTIFICATION;  Surgeon: Ronny Bacon, MD;  Location: [facility];  Service: General;  Laterality: Right;   CATARACT EXTRACTION, BILATERAL  01/2013, 2015   COLONOSCOPY WITH PROPOFOL N/A 11/03/2017   Procedure: COLONOSCOPY WITH PROPOFOL;  Surgeon: Virgel Manifold, MD;  Location: [facility];  Service: Endoscopy;  Laterality: N/A;   EYE SURGERY  2018   cataract and retinal detachment   EYE SURGERY Left 08/04/2021   hole in macular and detached retina right eye  05/31/2017   PARTIAL HYSTERECTOMY  2004   SENTINEL NODE BIOPSY Right 12/25/2019   Procedure: SENTINEL NODE BIOPSY;  Surgeon: Ronny Bacon, MD;  Location: [facility];  Service: General;  Laterality: Right;   TUBAL  LIGATION      Medical History: Past Medical History:  Diagnosis Date   Abnormal mammogram    Asthma    Cancer ([place]) 12/2019   breast cancer on right   Menorrhagia    Migraines    Multiple thyroid nodules 12/2019   biopsied in past. now just following with yearly ultrasounds   Personal history of radiation therapy 2021   right breast ca   Reflux esophagitis     Family History: Family History  Problem Relation Age of Onset   COPD  Mother    Asthma Mother    Atrial fibrillation Mother    Heart disease Father    Alzheimer's disease Maternal Aunt    Alzheimer's disease Maternal Grandmother    Breast cancer Other     Social History   Socioeconomic History   Marital status: Married    Spouse name: Linwood   Number of children: Not on file   Years of education: Not on file   Highest education level: Not on file  Occupational History   Not on file  Tobacco Use   Smoking status: Never   Smokeless tobacco: Never  Vaping Use   Vaping Use: Never used  Substance and Sexual Activity   Alcohol use: No   Drug use: No   Sexual activity: Yes  Other Topics Concern   Not on file  Social History Narrative   Patient lives husband in her home.   Social Determinants of Health   Financial Resource Strain: Not on file  Food Insecurity: Not on file  Transportation Needs: Not on file  Physical Activity: Not on file  Stress: Not on file  Social Connections: Not on file  Intimate Partner Violence: Not on file      Review of Systems  Constitutional:  Negative for activity change, appetite change, chills, fatigue, fever and unexpected weight change.  HENT: Negative.  Negative for congestion, ear pain, rhinorrhea, sore throat and trouble swallowing.   Eyes: Negative.   Respiratory: Negative.  Negative for cough, chest tightness, shortness of breath and wheezing.   Cardiovascular: Negative.  Negative for chest pain.  Gastrointestinal: Negative.  Negative for abdominal pain, blood in stool, constipation, diarrhea, nausea and vomiting.  Endocrine: Negative.   Genitourinary: Negative.  Negative for difficulty urinating, dysuria, frequency, hematuria and urgency.  Musculoskeletal: Negative.  Negative for arthralgias, back pain, joint swelling, myalgias and neck pain.  Skin: Negative.  Negative for rash and wound.  Allergic/Immunologic: Negative.  Negative for immunocompromised state.  Neurological: Negative.  Negative for  dizziness, seizures, numbness and headaches.  Hematological: Negative.   Psychiatric/Behavioral: Negative.  Negative for behavioral problems, self-injury and suicidal ideas. The patient is not nervous/anxious.     Vital Signs: BP (!) 140/82 Comment: 174/99  Pulse 85   Temp (!) 97.4 F (36.3 C)   Resp 16   Ht '5\' 9"'$  (1.753 m)   Wt 228 lb 9.6 oz (103.7 kg)   SpO2 97%   BMI 33.76 kg/m    Physical Exam Vitals reviewed.  Constitutional:      General: She is awake. She is not in acute distress.    Appearance: Normal appearance. She is well-developed and well-groomed. She is obese. She is not ill-appearing or diaphoretic.  HENT:     Head: Normocephalic and atraumatic.     Right Ear: Tympanic membrane, ear canal and external ear normal.     Left Ear: Tympanic membrane, ear canal and external ear normal.  Nose: Nose normal. No congestion or rhinorrhea.     Mouth/Throat:     Lips: Pink.     Mouth: Mucous membranes are moist.     Pharynx: Oropharynx is clear. Uvula midline. No oropharyngeal exudate or posterior oropharyngeal erythema.  Eyes:     General: Lids are normal. Vision grossly intact. Gaze aligned appropriately. No scleral icterus.       Right eye: No discharge.        Left eye: No discharge.     Extraocular Movements: Extraocular movements intact.     Conjunctiva/sclera: Conjunctivae normal.     Pupils: Pupils are equal, round, and reactive to light.     Funduscopic exam:    Right eye: Red reflex present.        Left eye: Red reflex present. Neck:     Thyroid: No thyromegaly.     Vascular: No JVD.     Trachea: Trachea and phonation normal. No tracheal deviation.  Cardiovascular:     Rate and Rhythm: Normal rate and regular rhythm.     Pulses: Normal pulses.     Heart sounds: Normal heart sounds, S1 normal and S2 normal. No murmur heard.    No friction rub. No gallop.  Pulmonary:     Effort: Pulmonary effort is normal. No accessory muscle usage or respiratory  distress.     Breath sounds: Normal breath sounds and air entry. No stridor. No wheezing or rales.  Chest:     Chest wall: No tenderness.     Comments: Declined clinical breast exam, sees oncology for this and gets annual mammograms.  Abdominal:     General: Bowel sounds are normal. There is no distension.     Palpations: Abdomen is soft. There is no shifting dullness, fluid wave, mass or pulsatile mass.     Tenderness: There is no abdominal tenderness. There is no guarding or rebound.  Musculoskeletal:        General: No tenderness or deformity. Normal range of motion.     Cervical back: Normal range of motion and neck supple.     Right lower leg: No edema.     Left lower leg: No edema.  Lymphadenopathy:     Cervical: No cervical adenopathy.  Skin:    General: Skin is warm and dry.     Capillary Refill: Capillary refill takes less than 2 seconds.     Coloration: Skin is not pale.     Findings: No erythema or rash.  Neurological:     Mental Status: She is alert and oriented to person, place, and time.     Cranial Nerves: No cranial nerve deficit.     Motor: No abnormal muscle tone.     Coordination: Coordination normal.     Gait: Gait normal.     Deep Tendon Reflexes: Reflexes are normal and symmetric.  Psychiatric:        Mood and Affect: Mood and affect normal.        Behavior: Behavior normal. Behavior is cooperative.        Thought Content: Thought content normal.        Judgment: Judgment normal.        Assessment/Plan: 1. Encounter for routine adult health examination with abnormal findings Age-appropriate preventive screenings and vaccinations discussed, annual physical exam completed. Routine labs for health maintenance doen prior to office visit, results discussed with patient today. PHM updated.   2. Mixed hyperlipidemia Continue rosuvastatin as prescribed.  - rosuvastatin (CRESTOR) 5  MG tablet; Take 1 tablet (5 mg total) by mouth daily.  Dispense: 90 tablet;  Refill: 1  3. Dysuria Routine urinalysis done - UA/M w/rflx Culture, Routine  4. Exposure to environmental toxic substances Low dose CT chest ordered.  - CT CHEST LUNG CA SCREEN LOW DOSE W/O CM; Future  5. Screening for colorectal cancer Referred to GI for colonoscopy - Ambulatory referral to Gastroenterology  6. Primary insomnia Refills of ambien ordered, follow up in 3 months for additional refills - zolpidem (AMBIEN) 10 MG tablet; TAKE 1 TABLET BY MOUTH AT BEDTIME AS NEEDED FOR SLEEP *NEED APPOINTMENT FOR FURTHER FILLS*  Dispense: 30 tablet; Refill: 2      General Counseling: Carter verbalizes understanding of the findings of todays visit and agrees with plan of treatment. I have discussed any further diagnostic evaluation that may be needed or ordered today. We also reviewed her medications today. she has been encouraged to call the office with any questions or concerns that should arise related to todays visit.    Orders Placed This Encounter  Procedures   Microscopic Examination   CT Chest Wo Contrast   UA/M w/rflx Culture, Routine   Ambulatory referral to Gastroenterology    Meds ordered this encounter  Medications   rosuvastatin (CRESTOR) 5 MG tablet    Sig: Take 1 tablet (5 mg total) by mouth daily.    Dispense:  90 tablet    Refill:  1   DISCONTD: zolpidem (AMBIEN) 10 MG tablet    Sig: TAKE 1 TABLET BY MOUTH AT BEDTIME AS NEEDED FOR SLEEP *NEED APPOINTMENT FOR FURTHER FILLS*    Dispense:  30 tablet    Refill:  2   zolpidem (AMBIEN) 10 MG tablet    Sig: TAKE 1 TABLET BY MOUTH AT BEDTIME AS NEEDED FOR SLEEP *NEED APPOINTMENT FOR FURTHER FILLS*    Dispense:  30 tablet    Refill:  2    Return in about 6 months (around 05/11/2023) for F/U, med refill, Akina Maish PCP.   Total time spent:30 Minutes Time spent includes review of chart, medications, test results, and follow up plan with the patient.   [place] Controlled Substance Database was reviewed by me.  This  patient was seen by Jonetta Osgood, FNP-C in collaboration with Dr. Clayborn Bigness as a part of collaborative care agreement.  Holmes Hays R. Valetta Fuller, MSN, FNP-C Internal medicine

## 2022-11-11 ENCOUNTER — Telehealth: Payer: Self-pay | Admitting: Nurse Practitioner

## 2022-11-11 ENCOUNTER — Encounter: Payer: Self-pay | Admitting: Nurse Practitioner

## 2022-11-11 LAB — UA/M W/RFLX CULTURE, ROUTINE
Bilirubin, UA: NEGATIVE
Glucose, UA: NEGATIVE
Ketones, UA: NEGATIVE
Leukocytes,UA: NEGATIVE
Nitrite, UA: NEGATIVE
Protein,UA: NEGATIVE
RBC, UA: NEGATIVE
Specific Gravity, UA: 1.012 (ref 1.005–1.030)
Urobilinogen, Ur: 0.2 mg/dL (ref 0.2–1.0)
pH, UA: 7.5 (ref 5.0–7.5)

## 2022-11-11 LAB — MICROSCOPIC EXAMINATION
Bacteria, UA: NONE SEEN
Casts: NONE SEEN /lpf
RBC, Urine: NONE SEEN /hpf (ref 0–2)
WBC, UA: NONE SEEN /hpf (ref 0–5)

## 2022-11-11 NOTE — Telephone Encounter (Signed)
CT order faxed to [facility]

## 2022-11-14 ENCOUNTER — Ambulatory Visit
Admission: RE | Admit: 2022-11-14 | Discharge: 2022-11-14 | Disposition: A | Discharge status: Home / Self Care | Payer: 59 | Source: Ambulatory Visit | Attending: Oncology | Admitting: Oncology

## 2022-11-14 ENCOUNTER — Telehealth: Payer: Self-pay | Admitting: Nurse Practitioner

## 2022-11-14 DIAGNOSIS — Z79811 Long term (current) use of aromatase inhibitors: Secondary | ICD-10-CM

## 2022-11-14 DIAGNOSIS — C50411 Malignant neoplasm of upper-outer quadrant of right female breast: Secondary | ICD-10-CM | POA: Diagnosis present

## 2022-11-14 DIAGNOSIS — Z170 Estrogen receptor positive status [ER+]: Secondary | ICD-10-CM | POA: Diagnosis present

## 2022-11-14 NOTE — Addendum Note (Signed)
Addended by: Jonetta Osgood on: 11/14/2022 08:06 AM   Modules accepted: Orders

## 2022-11-14 NOTE — Telephone Encounter (Signed)
Lvm to notify patient of CT appointment and to schedule follow up-[facility]

## 2022-11-22 ENCOUNTER — Ambulatory Visit
Admission: RE | Admit: 2022-11-22 | Discharge: 2022-11-22 | Disposition: A | Discharge status: Home / Self Care | Payer: 59 | Source: Ambulatory Visit | Attending: Nurse Practitioner | Admitting: Nurse Practitioner

## 2022-11-22 DIAGNOSIS — Z77128 Contact with and (suspected) exposure to other hazards in the physical environment: Secondary | ICD-10-CM

## 2022-11-30 ENCOUNTER — Encounter: Payer: Self-pay | Admitting: Gastroenterology

## 2022-12-01 ENCOUNTER — Encounter
Admission: RE | Disposition: A | Discharge status: Home / Self Care | Payer: Self-pay | Source: Home / Self Care | Attending: Gastroenterology

## 2022-12-01 ENCOUNTER — Ambulatory Visit: Payer: 59 | Admitting: Certified Registered"

## 2022-12-01 ENCOUNTER — Encounter: Payer: Self-pay | Admitting: Gastroenterology

## 2022-12-01 ENCOUNTER — Ambulatory Visit
Admission: RE | Admit: 2022-12-01 | Discharge: 2022-12-01 | Disposition: A | Discharge status: Home / Self Care | Payer: 59 | Attending: Gastroenterology | Admitting: Gastroenterology

## 2022-12-01 DIAGNOSIS — Z1211 Encounter for screening for malignant neoplasm of colon: Secondary | ICD-10-CM | POA: Insufficient documentation

## 2022-12-01 DIAGNOSIS — D125 Benign neoplasm of sigmoid colon: Secondary | ICD-10-CM | POA: Insufficient documentation

## 2022-12-01 DIAGNOSIS — I2510 Atherosclerotic heart disease of native coronary artery without angina pectoris: Secondary | ICD-10-CM | POA: Diagnosis not present

## 2022-12-01 DIAGNOSIS — D123 Benign neoplasm of transverse colon: Secondary | ICD-10-CM | POA: Insufficient documentation

## 2022-12-01 DIAGNOSIS — Z09 Encounter for follow-up examination after completed treatment for conditions other than malignant neoplasm: Secondary | ICD-10-CM | POA: Diagnosis not present

## 2022-12-01 DIAGNOSIS — D122 Benign neoplasm of ascending colon: Secondary | ICD-10-CM | POA: Insufficient documentation

## 2022-12-01 DIAGNOSIS — K635 Polyp of colon: Secondary | ICD-10-CM | POA: Diagnosis not present

## 2022-12-01 DIAGNOSIS — Z86010 Personal history of colonic polyps: Secondary | ICD-10-CM | POA: Insufficient documentation

## 2022-12-01 HISTORY — DX: Dyspnea, unspecified: R06.00

## 2022-12-01 HISTORY — DX: Shortness of breath: R06.02

## 2022-12-01 HISTORY — PX: COLONOSCOPY WITH PROPOFOL: SHX5780

## 2022-12-01 SURGERY — COLONOSCOPY WITH PROPOFOL
Anesthesia: General

## 2022-12-01 MED ORDER — MIDAZOLAM HCL 2 MG/2ML IJ SOLN
INTRAMUSCULAR | Status: DC | PRN
  Administered 2022-12-01: 2 mg via INTRAVENOUS

## 2022-12-01 MED ORDER — PROPOFOL 10 MG/ML IV BOLUS
INTRAVENOUS | Status: DC | PRN
  Administered 2022-12-01: 60 mg via INTRAVENOUS
  Administered 2022-12-01: 30 mg via INTRAVENOUS
  Administered 2022-12-01: 10 mg via INTRAVENOUS

## 2022-12-01 MED ORDER — LIDOCAINE HCL (CARDIAC) PF 100 MG/5ML IV SOSY
PREFILLED_SYRINGE | INTRAVENOUS | Status: DC | PRN
  Administered 2022-12-01: 100 mg via INTRAVENOUS

## 2022-12-01 MED ORDER — MIDAZOLAM HCL 2 MG/2ML IJ SOLN
INTRAMUSCULAR | Status: AC
  Filled 2022-12-01: qty 2

## 2022-12-01 MED ORDER — SODIUM CHLORIDE 0.9 % IV SOLN: INTRAVENOUS | Status: DC

## 2022-12-01 MED ORDER — PROPOFOL 500 MG/50ML IV EMUL
INTRAVENOUS | Status: DC | PRN
  Administered 2022-12-01: 165 ug/kg/min via INTRAVENOUS

## 2022-12-01 NOTE — Anesthesia Postprocedure Evaluation (Signed)
Anesthesia Post Note  Patient: Colleen Richard  Procedure(s) Performed: COLONOSCOPY WITH PROPOFOL  Patient location during evaluation: PACU Anesthesia Type: General Level of consciousness: awake and alert Pain management: pain level controlled Vital Signs Assessment: post-procedure vital signs reviewed and stable Respiratory status: spontaneous breathing, nonlabored ventilation and respiratory function stable Cardiovascular status: blood pressure returned to baseline and stable Postop Assessment: no apparent nausea or vomiting Anesthetic complications: no   No notable events documented.   Last Vitals:  Vitals:   12/01/22 1012 12/01/22 1042  BP: 121/76 (!) 151/85  Pulse: 92   Resp: 19   Temp: (!) 36.1 C   SpO2: 100% 100%    Last Pain:  Vitals:   12/01/22 1042  TempSrc:   PainSc: 0-No pain                 Iran Ouch

## 2022-12-01 NOTE — Transfer of Care (Signed)
Immediate Anesthesia Transfer of Care Note  Patient: Colleen Richard  Procedure(s) Performed: COLONOSCOPY WITH PROPOFOL  Patient Location: Endoscopy Unit  Anesthesia Type:General  Level of Consciousness: drowsy and patient cooperative  Airway & Oxygen Therapy: Patient Spontanous Breathing and Patient connected to face mask oxygen  Post-op Assessment: Report given to RN and Post -op Vital signs reviewed and stable  Post vital signs: Reviewed and stable  Last Vitals:  Vitals Value Taken Time  BP 121/76 12/01/22 1013  Temp 36.1 C 12/01/22 1012  Pulse 87 12/01/22 1013  Resp 18 12/01/22 1013  SpO2 100 % 12/01/22 1013  Vitals shown include unvalidated device data.  Last Pain:  Vitals:   12/01/22 1012  TempSrc: Temporal         Complications: No notable events documented.

## 2022-12-01 NOTE — Anesthesia Preprocedure Evaluation (Addendum)
Anesthesia Evaluation  Patient identified by MRN, date of birth, ID band Patient awake    Reviewed: Allergy & Precautions, H&P , NPO status , Patient's Chart, lab work & pertinent test results  Airway Mallampati: II  TM Distance: >3 FB Neck ROM: full    Dental no notable dental hx.    Pulmonary shortness of breath, asthma    Pulmonary exam normal        Cardiovascular Exercise Tolerance: Good + CAD (on CT scan) and + DOE  Normal cardiovascular exam  CT Chest: IMPRESSION: 1. A 5.5 mm mean diameter ground-glass nodule in the right upper lobe is favored to be inflammatory in etiology. Recommend follow-up CT chest in 3-6 months assess for resolution. 2. Multivessel coronary artery calcification. 3. Mild aortic atherosclerosis.    Neuro/Psych negative neurological ROS  negative psych ROS   GI/Hepatic Neg liver ROS,GERD  Controlled,,  Endo/Other  negative endocrine ROS    Renal/GU negative Renal ROS  negative genitourinary   Musculoskeletal  (+) Arthritis ,    Abdominal  (+) + obese  Peds  Hematology negative hematology ROS (+)   Anesthesia Other Findings Undergoing work-up with SOB. Denies angina. Good METS  Past Medical History: No date: Abnormal mammogram No date: Asthma 12/2019: Cancer [facility])     Comment:  breast cancer on right s/p radiation therapy No date: Dyspnea No date: Menorrhagia No date: Migraines 12/2019: Multiple thyroid nodules     Comment:  biopsied in past. now just following with yearly               ultrasounds 2021: Personal history of radiation therapy     Comment:  right breast ca No date: Reflux esophagitis No date: Shortness of breath  Past Surgical History: 2004: ABDOMINAL HYSTERECTOMY 08/26/2015: BREAST BIOPSY; Right     Comment:  stereo, top hat clip  12/10/2019: BREAST BIOPSY; Right     Comment:  affirm, x marker,[facility] 2010: BREAST EXCISIONAL BIOPSY; Bilateral     Comment:   neg 12/25/2019: BREAST LUMPECTOMY; Right     Comment:  [facility], low grade DCIS, Negative LNs 12/25/2019: BREAST LUMPECTOMY WITH RADIOFREQUENCY TAG IDENTIFICATION;  Right     Comment:  Procedure: BREAST LUMPECTOMY WITH RADIOFREQUENCY TAG               IDENTIFICATION;  Surgeon: Ronny Bacon, MD;                Location: [facility];  Service: General;  Laterality:               Right; 01/2013, 2015: CATARACT EXTRACTION, BILATERAL 11/03/2017: COLONOSCOPY WITH PROPOFOL; N/A     Comment:  Procedure: COLONOSCOPY WITH PROPOFOL;  Surgeon:               Virgel Manifold, MD;  Location: [facility];                Service: Endoscopy;  Laterality: N/A; 2018: EYE SURGERY     Comment:  cataract and retinal detachment 08/04/2021: EYE SURGERY; Left 05/31/2017: hole in macular and detached retina right eye 2004: PARTIAL HYSTERECTOMY 12/25/2019: SENTINEL NODE BIOPSY; Right     Comment:  Procedure: SENTINEL NODE BIOPSY;  Surgeon: Ronny Bacon, MD;  Location: [facility];  Service: General;                Laterality: Right; No date: TUBAL LIGATION  BMI  Body Mass Index: 32.93 kg/m      Reproductive/Obstetrics negative OB ROS                             Anesthesia Physical Anesthesia Plan  ASA: 2  Anesthesia Plan: General   Post-op Pain Management:    Induction: Intravenous  PONV Risk Score and Plan: Propofol infusion and TIVA  Airway Management Planned: Natural Airway  Additional Equipment:   Intra-op Plan:   Post-operative Plan:   Informed Consent: I have reviewed the patients History and Physical, chart, labs and discussed the procedure including the risks, benefits and alternatives for the proposed anesthesia with the patient or authorized representative who has indicated his/her understanding and acceptance.     Dental Advisory Given  Plan Discussed with: CRNA and Surgeon  Anesthesia Plan Comments:         Anesthesia  Quick Evaluation

## 2022-12-01 NOTE — H&P (Signed)
Cephas Darby, MD [address]  [place]  [place], [place] [address]  Main: [phone]  Fax: [phone] Pager: [phone]  Primary Care Physician:  Jonetta Osgood, NP Primary Gastroenterologist:  Dr. Cephas Darby  Pre-Procedure History & Physical: HPI:  Colleen Richard is a 57 y.o. female is here for an colonoscopy.   Past Medical History:  Diagnosis Date   Abnormal mammogram    Asthma    Cancer ([place]) 12/2019   breast cancer on right   Dyspnea    Menorrhagia    Migraines    Multiple thyroid nodules 12/2019   biopsied in past. now just following with yearly ultrasounds   Personal history of radiation therapy 2021   right breast ca   Reflux esophagitis    Shortness of breath     Past Surgical History:  Procedure Laterality Date   ABDOMINAL HYSTERECTOMY  2004   BREAST BIOPSY Right 08/26/2015   stereo, top hat clip    BREAST BIOPSY Right 12/10/2019   affirm, x marker,[facility]   BREAST EXCISIONAL BIOPSY Bilateral 2010   neg   BREAST LUMPECTOMY Right 12/25/2019   [facility], low grade DCIS, Negative LNs   BREAST LUMPECTOMY WITH RADIOFREQUENCY TAG IDENTIFICATION Right 12/25/2019   Procedure: BREAST LUMPECTOMY WITH RADIOFREQUENCY TAG IDENTIFICATION;  Surgeon: Ronny Bacon, MD;  Location: [facility];  Service: General;  Laterality: Right;   CATARACT EXTRACTION, BILATERAL  01/2013, 2015   COLONOSCOPY WITH PROPOFOL N/A 11/03/2017   Procedure: COLONOSCOPY WITH PROPOFOL;  Surgeon: Virgel Manifold, MD;  Location: [facility];  Service: Endoscopy;  Laterality: N/A;   EYE SURGERY  2018   cataract and retinal detachment   EYE SURGERY Left 08/04/2021   hole in macular and detached retina right eye  05/31/2017   PARTIAL HYSTERECTOMY  2004   SENTINEL NODE BIOPSY Right 12/25/2019   Procedure: SENTINEL NODE BIOPSY;  Surgeon: Ronny Bacon, MD;  Location: [facility];  Service: General;  Laterality: Right;   TUBAL LIGATION      Prior to Admission medications    Medication Sig Start Date End Date Taking? Authorizing Provider  albuterol (VENTOLIN HFA) 108 (90 Base) MCG/ACT inhaler Inhale 2 puffs into the lungs every 6 (six) hours as needed for wheezing or shortness of breath. 05/03/22  Yes Abernathy, Yetta Flock, NP  anastrozole (ARIMIDEX) 1 MG tablet Take 1 tablet (1 mg total) by mouth daily. 11/29/21  Yes Jacquelin Hawking, NP  aspirin-acetaminophen-caffeine (EXCEDRIN MIGRAINE) [phone] MG tablet Take 2 tablets by mouth every 6 (six) hours as needed for headache.   Yes [provider]  CALCIUM CARBONATE-VITAMIN D PO Take 2 tablets by mouth.   Yes [provider]  rosuvastatin (CRESTOR) 5 MG tablet Take 1 tablet (5 mg total) by mouth daily. 11/10/22  Yes Abernathy, Alyssa, NP  zolpidem (AMBIEN) 10 MG tablet TAKE 1 TABLET BY MOUTH AT BEDTIME AS NEEDED FOR SLEEP *NEED APPOINTMENT FOR FURTHER FILLS* 11/10/22  Yes Abernathy, Alyssa, NP  doxycycline (VIBRA-TABS) 100 MG tablet Take 1 tablet (100 mg total) by mouth 2 (two) times daily. Patient not taking: Reported on 12/01/2022 11/07/22   Brunetta Jeans, PA-C  fluticasone [facility]) 50 MCG/ACT nasal spray Place 2 sprays into both nostrils daily. 11/07/22   Brunetta Jeans, PA-C  ipratropium-albuterol (DUONEB) 0.5-2.5 (3) MG/3ML SOLN Take 3 mLs by nebulization every 6 (six) hours as needed. 01/19/21   Luiz Ochoa, NP    Allergies as of 11/10/2022 - Review Complete 11/10/2022  Allergen Reaction  Noted   Amoxicillin Nausea And Vomiting 11/07/2022    Family History  Problem Relation Age of Onset   COPD Mother    Asthma Mother    Atrial fibrillation Mother    Heart disease Father    Alzheimer's disease Maternal Aunt    Alzheimer's disease Maternal Grandmother    Breast cancer Other     Social History   Socioeconomic History   Marital status: Married    Spouse name: Linwood   Number of children: Not on file   Years of education: Not on file   Highest education level: Not on file   Occupational History   Not on file  Tobacco Use   Smoking status: Never   Smokeless tobacco: Never  Vaping Use   Vaping Use: Never used  Substance and Sexual Activity   Alcohol use: No   Drug use: No   Sexual activity: Yes  Other Topics Concern   Not on file  Social History Narrative   Patient lives husband in her home.   Social Determinants of Health   Financial Resource Strain: Not on file  Food Insecurity: Not on file  Transportation Needs: Not on file  Physical Activity: Not on file  Stress: Not on file  Social Connections: Not on file  Intimate Partner Violence: Not on file    Review of Systems: See HPI, otherwise negative ROS  Physical Exam: BP (!) 164/102   Pulse (!) 101   Temp (!) 97.4 F (36.3 C) (Temporal)   Resp 17   Wt 101.2 kg   SpO2 100%   BMI 32.93 kg/m  General:   Alert,  pleasant and cooperative in NAD Head:  Normocephalic and atraumatic. Neck:  Supple; no masses or thyromegaly. Lungs:  Clear throughout to auscultation.    Heart:  Regular rate and rhythm. Abdomen:  Soft, nontender and nondistended. Normal bowel sounds, without guarding, and without rebound.   Neurologic:  Alert and  oriented x4;  grossly normal neurologically.  Impression/Plan: Colleen Richard is here for an colonoscopy to be performed for h/o colon adenomas  Risks, benefits, limitations, and alternatives regarding  colonoscopy have been reviewed with the patient.  Questions have been answered.  All parties agreeable.   Sherri Sear, MD  12/01/2022, 9:10 AM

## 2022-12-01 NOTE — Op Note (Signed)
[facility]astroenterology Patient Name: Colleen Richard Procedure Date: 12/01/2022 9:40 AM MRN: [id number] Account #: [id number] Date of Birth: 06-Jan-1966 Admit Type: Inpatient Age: 57 Room: [facility] ENDO ROOM 3 Gender: Female Note Status: Finalized Instrument Name: Jasper Riling [id number] Procedure:             Colonoscopy Indications:           Surveillance: Personal history of adenomatous polyps                         on last colonoscopy 5 years ago, Last colonoscopy:                         February 2019 Providers:             Lin Landsman MD, MD Referring MD:          Jonetta Osgood (Referring MD) Medicines:             General Anesthesia Complications:         No immediate complications. Estimated blood loss: None. Procedure:             Pre-Anesthesia Assessment:                        - Prior to the procedure, a History and Physical was                         performed, and patient medications and allergies were                         reviewed. The patient is competent. The risks and                         benefits of the procedure and the sedation options and                         risks were discussed with the patient. All questions                         were answered and informed consent was obtained.                         Patient identification and proposed procedure were                         verified by the physician, the nurse, the                         anesthesiologist, the anesthetist and the technician                         in the pre-procedure area in the procedure room in the                         endoscopy suite. Mental Status Examination: alert and                         oriented. Airway Examination: normal oropharyngeal  airway and neck mobility. Respiratory Examination:                         clear to auscultation. CV Examination: normal.                         Prophylactic Antibiotics: The  patient does not require                         prophylactic antibiotics. Prior Anticoagulants: The                         patient has taken no anticoagulant or antiplatelet                         agents. ASA Grade Assessment: II - A patient with mild                         systemic disease. After reviewing the risks and                         benefits, the patient was deemed in satisfactory                         condition to undergo the procedure. The anesthesia                         plan was to use general anesthesia. Immediately prior                         to administration of medications, the patient was                         re-assessed for adequacy to receive sedatives. The                         heart rate, respiratory rate, oxygen saturations,                         blood pressure, adequacy of pulmonary ventilation, and                         response to care were monitored throughout the                         procedure. The physical status of the patient was                         re-assessed after the procedure.                        After obtaining informed consent, the colonoscope was                         passed under direct vision. Throughout the procedure,                         the patient's blood pressure, pulse, and oxygen  saturations were monitored continuously. The                         Colonoscope was introduced through the anus and                         advanced to the the cecum, identified by appendiceal                         orifice and ileocecal valve. The colonoscopy was                         performed without difficulty. The patient tolerated                         the procedure well. The quality of the bowel                         preparation was evaluated using the BBPS [facility] Bowel                         Preparation Scale) with scores of: Right Colon = 3,                         Transverse Colon = 3 and  Left Colon = 3 (entire mucosa                         seen well with no residual staining, small fragments                         of stool or opaque liquid). The total BBPS score                         equals 9. The ileocecal valve, appendiceal orifice,                         and rectum were photographed. Findings:      The perianal and digital rectal examinations were normal. Pertinent       negatives include normal sphincter tone and no palpable rectal lesions.      Three sessile polyps were found in the sigmoid colon, transverse colon       and ascending colon. The polyps were 3 to 5 mm in size. These polyps       were removed with a cold snare. Resection and retrieval were complete.      A tattoo was seen in the transverse colon. A post-polypectomy scar was       found at the tattoo site. There was no evidence of residual polyp tissue.      The retroflexed view of the distal rectum and anal verge was normal and       showed no anal or rectal abnormalities. Impression:            - Three 3 to 5 mm polyps in the sigmoid colon, in the                         transverse colon and in the ascending colon, removed  with a cold snare. Resected and retrieved.                        - A tattoo was seen in the transverse colon. A                         post-polypectomy scar was found at the tattoo site.                         There was no evidence of residual polyp tissue.                        - The distal rectum and anal verge are normal on                         retroflexion view. Recommendation:        - Discharge patient to home (with escort).                        - Resume previous diet today.                        - Continue present medications.                        - Await pathology results.                        - Repeat colonoscopy in 3 - 5 years for surveillance                         based on pathology results. Procedure Code(s):     ---  Professional ---                        [phone], Colonoscopy, flexible; with removal of                         tumor(s), polyp(s), or other lesion(s) by snare                         technique Diagnosis Code(s):     --- Professional ---                        Z86.010, Personal history of colonic polyps                        D12.5, Benign neoplasm of sigmoid colon                        D12.3, Benign neoplasm of transverse colon (hepatic                         flexure or splenic flexure)                        D12.2, Benign neoplasm of ascending colon CPT copyright 2022 American Medical Association. All rights reserved. The codes documented in this report are preliminary and upon coder review may  be revised to meet current compliance requirements.  Dr. Ulyess Mort Lin Landsman MD, MD 12/01/2022 10:12:18 AM This report has been signed electronically. Number of Addenda: 0 Note Initiated On: 12/01/2022 9:40 AM Scope Withdrawal Time: 0 hours 14 minutes 8 seconds  Total Procedure Duration: 0 hours 18 minutes 12 seconds  Estimated Blood Loss:  Estimated blood loss: none.      [facility]

## 2022-12-01 NOTE — Anesthesia Procedure Notes (Signed)
Procedure Name: General with mask airway Date/Time: 12/01/2022 9:48 AM  Performed by: Kelton Pillar, CRNAPre-anesthesia Checklist: Patient identified, Emergency Drugs available, Suction available and Patient being monitored Patient Re-evaluated:Patient Re-evaluated prior to induction Oxygen Delivery Method: Simple face mask Induction Type: IV induction Placement Confirmation: positive ETCO2, CO2 detector and breath sounds checked- equal and bilateral Dental Injury: Teeth and Oropharynx as per pre-operative assessment

## 2022-12-02 ENCOUNTER — Ambulatory Visit: Payer: 59 | Admitting: Nurse Practitioner

## 2022-12-02 ENCOUNTER — Encounter: Payer: Self-pay | Admitting: Gastroenterology

## 2022-12-02 VITALS — BP 143/89 | HR 61 | Temp 98.4°F | Resp 16 | Ht 69.0 in | Wt 226.4 lb

## 2022-12-02 DIAGNOSIS — R911 Solitary pulmonary nodule: Secondary | ICD-10-CM

## 2022-12-02 DIAGNOSIS — I2510 Atherosclerotic heart disease of native coronary artery without angina pectoris: Secondary | ICD-10-CM

## 2022-12-02 LAB — SURGICAL PATHOLOGY

## 2022-12-02 NOTE — Progress Notes (Signed)
[facility], [place] [address]  Internal MEDICINE  Office Visit Note  Patient Name: Colleen Richard  [id number]  [id number]  Date of Service: 12/02/2022  Chief Complaint  Patient presents with   Follow-up    Review CT results    HPI Colleen Richard presents for a follow-up visit to review CT scan results.   CT CHEST FINDINGS: Cardiovascular: Multivessel coronary artery calcification. Mild calcification at the aortic arch. Normal heart size. No pericardial effusion.   Mediastinum/Nodes: No enlarged mediastinal or axillary lymph nodes. Thyroid gland, trachea, and esophagus demonstrate no significant findings.   Lungs/Pleura: In the right upper lobe there is a 6 x 5 mm ground-glass nodule (5.5 mm mean diameter) on image 70 of series 3. Lungs are otherwise clear. No pleural effusion or pneumothorax.   Upper Abdomen: No acute abnormality.   Musculoskeletal: Skin thickening of the right breast is noted, consistent with history of radiation. No suspicious bone lesions identified.   IMPRESSION: 1. A 5.5 mm mean diameter ground-glass nodule in the right upper lobe is favored to be inflammatory in etiology. Recommend follow-up CT chest in 3-6 months assess for resolution. 2. Multivessel coronary artery calcification. 3. Mild aortic atherosclerosis.      Current Medication: Outpatient Encounter Medications as of 12/02/2022  Medication Sig Note   albuterol (VENTOLIN HFA) 108 (90 Base) MCG/ACT inhaler Inhale 2 puffs into the lungs every 6 (six) hours as needed for wheezing or shortness of breath.    anastrozole (ARIMIDEX) 1 MG tablet Take 1 tablet (1 mg total) by mouth daily.    aspirin-acetaminophen-caffeine (EXCEDRIN MIGRAINE) 250-250-65 MG tablet Take 2 tablets by mouth every 6 (six) hours as needed for headache.    CALCIUM CARBONATE-VITAMIN D PO Take 2 tablets by mouth.    fluticasone (FLONASE) 50 MCG/ACT nasal spray Place 2 sprays into both  nostrils daily.    ipratropium-albuterol (DUONEB) 0.5-2.5 (3) MG/3ML SOLN Take 3 mLs by nebulization every 6 (six) hours as needed. 05/03/2022: Do not send any more refills unless patient asks, she has many   rosuvastatin (CRESTOR) 5 MG tablet Take 1 tablet (5 mg total) by mouth daily.    zolpidem (AMBIEN) 10 MG tablet TAKE 1 TABLET BY MOUTH AT BEDTIME AS NEEDED FOR SLEEP *NEED APPOINTMENT FOR FURTHER FILLS*    No facility-administered encounter medications on file as of 12/02/2022.    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   BREAST LUMPECTOMY WITH RADIOFREQUENCY TAG IDENTIFICATION Right 12/25/2019   Procedure: BREAST LUMPECTOMY WITH RADIOFREQUENCY TAG IDENTIFICATION;  Surgeon: Ronny Bacon, MD;  Location: [facility] ORS;  Service: General;  Laterality: Right;   CATARACT EXTRACTION, BILATERAL  01/2013, 2015   COLONOSCOPY WITH PROPOFOL N/A 11/03/2017   Procedure: COLONOSCOPY WITH PROPOFOL;  Surgeon: Virgel Manifold, MD;  Location: [facility] ENDOSCOPY;  Service: Endoscopy;  Laterality: N/A;   COLONOSCOPY WITH PROPOFOL N/A 12/01/2022   Procedure: COLONOSCOPY WITH PROPOFOL;  Surgeon: Lin Landsman, MD;  Location[facility] ENDOSCOPY;  Service: Gastroenterology;  Laterality: N/A;   EYE SURGERY  2018   cataract and retinal detachment   EYE SURGERY Left 08/04/2021   hole in macular and detached retina  right eye  05/31/2017   PARTIAL HYSTERECTOMY  2004   SENTINEL NODE BIOPSY Right 12/25/2019   Procedure: SENTINEL NODE BIOPSY;  Surgeon: Ronny Bacon, MD;  Location: [facility] ORS;  Service: General;  Laterality: Right;   TUBAL LIGATION      Medical History: Past Medical History:  Diagnosis Date    Abnormal mammogram    Asthma    Cancer ([place]) 12/2019   breast cancer on right   Dyspnea    Menorrhagia    Migraines    Multiple thyroid nodules 12/2019   biopsied in past. now just following with yearly ultrasounds   Personal history of radiation therapy 2021   right breast ca   Reflux esophagitis    Shortness of breath     Family History: Family History  Problem Relation Age of Onset   COPD Mother    Asthma Mother    Atrial fibrillation Mother    Heart disease Father    Alzheimer's disease Maternal Aunt    Alzheimer's disease Maternal Grandmother    Breast cancer Other     Social History   Socioeconomic History   Marital status: Married    Spouse name: Linwood   Number of children: Not on file   Years of education: Not on file   Highest education level: Not on file  Occupational History   Not on file  Tobacco Use   Smoking status: Never   Smokeless tobacco: Never  Vaping Use   Vaping Use: Never used  Substance and Sexual Activity   Alcohol use: No   Drug use: No   Sexual activity: Yes  Other Topics Concern   Not on file  Social History Narrative   Patient lives husband in her home.   Social Determinants of Health   Financial Resource Strain: Not on file  Food Insecurity: Not on file  Transportation Needs: Not on file  Physical Activity: Not on file  Stress: Not on file  Social Connections: Not on file  Intimate Partner Violence: Not on file      Review of Systems  Constitutional:  Negative for chills, diaphoresis, fatigue and unexpected weight change.  HENT:  Negative for congestion, postnasal drip, rhinorrhea, sneezing and sore throat.   Eyes:  Negative for redness.  Respiratory:  Negative for cough, chest tightness and shortness of breath.   Cardiovascular: Negative.  Negative for chest pain and palpitations.  Gastrointestinal:  Negative for abdominal pain, constipation, diarrhea, nausea and vomiting.  Genitourinary:  Negative for dysuria and  frequency.  Musculoskeletal: Negative.  Negative for arthralgias, back pain, joint swelling and neck pain.  Skin:  Negative for rash.  Neurological: Negative.  Negative for tremors and numbness.  Hematological:  Negative for adenopathy. Does not bruise/bleed easily.  Psychiatric/Behavioral:  Negative for behavioral problems (Depression), decreased concentration, self-injury, sleep disturbance and suicidal ideas. The patient is not nervous/anxious.     Vital Signs: BP (!) 143/89   Pulse 61   Temp 98.4 F (36.9 C)   Resp 16   Ht 5\' 9"  (1.753 m)   Wt 226 lb 6.4 oz (102.7 kg)   SpO2 97%   BMI 33.43 kg/m    Physical Exam Vitals reviewed.  Constitutional:      General: She is not in acute distress.    Appearance: Normal appearance. She is obese. She is not ill-appearing.  HENT:     Head: Normocephalic and atraumatic.  Eyes:     Pupils: Pupils are equal, round, and reactive to  light.  Cardiovascular:     Rate and Rhythm: Normal rate and regular rhythm.  Pulmonary:     Effort: Pulmonary effort is normal. No respiratory distress.  Neurological:     Mental Status: She is alert and oriented to person, place, and time.  Psychiatric:        Mood and Affect: Mood normal.        Behavior: Behavior normal.        Assessment/Plan: 1. Coronary artery calcification seen on CT scan Referred to cardiology for further evaluation - Ambulatory referral to Cardiology  2. Lung nodule, solitary Repeat CT chest in 3 months  - CT Chest Wo Contrast; Future   General Counseling: Colleen Richard verbalizes understanding of the findings of todays visit and agrees with plan of treatment. I have discussed any further diagnostic evaluation that may be needed or ordered today. We also reviewed her medications today. she has been encouraged to call the office with any questions or concerns that should arise related to todays visit.    Orders Placed This Encounter  Procedures   CT Chest Wo Contrast    Ambulatory referral to Cardiology    No orders of the defined types were placed in this encounter.   Return for F/U, Colleen Richard PCP scheduled in august.   Total time spent:30 Minutes Time spent includes review of chart, medications, test results, and follow up plan with the patient.   [place] Controlled Substance Database was reviewed by me.  This patient was seen by Jonetta Osgood, FNP-C in collaboration with Dr. Clayborn Bigness as a part of collaborative care agreement.   Ilias Stcharles R. Valetta Fuller, MSN, FNP-C Internal medicine

## 2022-12-07 ENCOUNTER — Encounter: Payer: Self-pay | Admitting: Oncology

## 2022-12-07 ENCOUNTER — Other Ambulatory Visit: Payer: Self-pay | Admitting: Oncology

## 2022-12-07 ENCOUNTER — Ambulatory Visit: Payer: 59 | Admitting: Nurse Practitioner

## 2022-12-08 ENCOUNTER — Inpatient Hospital Stay: Payer: 59 | Attending: Oncology | Admitting: Oncology

## 2022-12-08 DIAGNOSIS — Z170 Estrogen receptor positive status [ER+]: Secondary | ICD-10-CM | POA: Diagnosis not present

## 2022-12-08 DIAGNOSIS — C50411 Malignant neoplasm of upper-outer quadrant of right female breast: Secondary | ICD-10-CM | POA: Diagnosis not present

## 2022-12-08 NOTE — Progress Notes (Signed)
[place]  [phone]) [phone] Fax:(336) [phone]  ID: Colleen Richard  MR#: [id number]  [id number]  Patient Care Team: Jonetta Osgood, NP as PCP - General (Nurse Practitioner) Theodore Demark, RN (Inactive) as Oncology Nurse Navigator Lloyd Huger, MD as Consulting Physician (Oncology) Noreene Filbert, MD as Radiation Oncologist (Radiation Oncology) Lavera Guise, MD (Internal Medicine)  I connected with Colleen Richard on 12/08/22 at  3:30 PM EDT by video enabled telemedicine visit and verified that I am speaking with the correct person using two identifiers.   I discussed the limitations, risks, security and privacy concerns of performing an evaluation and management service by telemedicine and the availability of in-person appointments. I also discussed with the patient that there may be a patient responsible charge related to this service. The patient expressed understanding and agreed to proceed.   Other persons participating in the visit and their role in the encounter: Patient, MD.  Patient's location: Home. Provider's location: Clinic.  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  INTERVAL HISTORY: Patient agreed to video assisted telemedicine visit for routine 56-month evaluation.  She continues to have hot flashes with anastrozole, but otherwise feels well. She has no neurologic complaints.  She denies any recent fevers or illnesses.  She has a good appetite and denies weight loss.  She has no chest pain, shortness of breath, cough, or hemoptysis. She denies any nausea, vomiting, constipation, or diarrhea.  She has no urinary complaints.  Patient offers no further specific complaints today.  REVIEW OF SYSTEMS:   Review of Systems  Constitutional: Negative.  Negative for diaphoresis, fever, malaise/fatigue and weight loss.   Respiratory: Negative.  Negative for cough, hemoptysis and shortness of breath.   Cardiovascular: Negative.  Negative for chest pain and leg swelling.  Gastrointestinal: Negative.  Negative for abdominal pain.  Genitourinary: Negative.  Negative for dysuria.  Musculoskeletal: Negative.  Negative for back pain and myalgias.  Skin: Negative.  Negative for rash.  Neurological:  Positive for sensory change. Negative for dizziness, focal weakness, weakness and headaches.  Psychiatric/Behavioral: Negative.  The patient is not nervous/anxious.     As per HPI. Otherwise, a complete review of systems is negative.  PAST MEDICAL HISTORY: Past Medical History:  Diagnosis Date   Abnormal mammogram    Asthma    Cancer ([place]) 12/2019   breast cancer on right   Dyspnea    Menorrhagia    Migraines    Multiple thyroid nodules 12/2019   biopsied in past. now just following with yearly ultrasounds   Personal history of radiation therapy 2021   right breast ca   Reflux esophagitis    Shortness of breath     PAST SURGICAL HISTORY: Past Surgical History:  Procedure Laterality Date   ABDOMINAL HYSTERECTOMY  2004   BREAST BIOPSY Right 08/26/2015   stereo, top hat clip    BREAST BIOPSY Right 12/10/2019   affirm, x marker,[facility]   BREAST EXCISIONAL BIOPSY Bilateral 2010   neg   BREAST LUMPECTOMY Right 12/25/2019   [facility], low grade DCIS, Negative LNs   BREAST LUMPECTOMY WITH RADIOFREQUENCY TAG IDENTIFICATION Right 12/25/2019   Procedure: BREAST LUMPECTOMY WITH RADIOFREQUENCY TAG IDENTIFICATION;  Surgeon: Ronny Bacon, MD;  Location: [facility] ORS;  Service: General;  Laterality: Right;   CATARACT EXTRACTION, BILATERAL  01/2013, 2015   COLONOSCOPY WITH PROPOFOL N/A 11/03/2017   Procedure: COLONOSCOPY WITH PROPOFOL;  Surgeon: Virgel Manifold, MD;  Location: [facility] ENDOSCOPY;  Service: Endoscopy;  Laterality: N/A;   COLONOSCOPY WITH PROPOFOL N/A 12/01/2022   Procedure: COLONOSCOPY WITH PROPOFOL;   Surgeon: Lin Landsman, MD;  Location: [facility] ENDOSCOPY;  Service: Gastroenterology;  Laterality: N/A;   EYE SURGERY  2018   cataract and retinal detachment   EYE SURGERY Left 08/04/2021   hole in macular and detached retina right eye  05/31/2017   PARTIAL HYSTERECTOMY  2004   SENTINEL NODE BIOPSY Right 12/25/2019   Procedure: SENTINEL NODE BIOPSY;  Surgeon: Ronny Bacon, MD;  Location: [facility] ORS;  Service: General;  Laterality: Right;   TUBAL LIGATION      FAMILY HISTORY: Family History  Problem Relation Age of Onset   COPD Mother    Asthma Mother    Atrial fibrillation Mother    Heart disease Father    Alzheimer's disease Maternal Aunt    Alzheimer's disease Maternal Grandmother    Breast cancer Other     ADVANCED DIRECTIVES (Y/N):  N  HEALTH MAINTENANCE: Social History   Tobacco Use   Smoking status: Never   Smokeless tobacco: Never  Vaping Use   Vaping Use: Never used  Substance Use Topics   Alcohol use: No   Drug use: No     Colonoscopy:  PAP:  Bone density:  Lipid panel:  Allergies  Allergen Reactions   Amoxicillin Nausea And Vomiting    Current Outpatient Medications  Medication Sig Dispense Refill   albuterol (VENTOLIN HFA) 108 (90 Base) MCG/ACT inhaler Inhale 2 puffs into the lungs every 6 (six) hours as needed for wheezing or shortness of breath. 18 g 3   anastrozole (ARIMIDEX) 1 MG tablet Take 1 tablet (1 mg total) by mouth daily. 90 tablet 3   aspirin-acetaminophen-caffeine (EXCEDRIN MIGRAINE) 250-250-65 MG tablet Take 2 tablets by mouth every 6 (six) hours as needed for headache.     CALCIUM CARBONATE-VITAMIN D PO Take 2 tablets by mouth.     fluticasone (FLONASE) 50 MCG/ACT nasal spray Place 2 sprays into both nostrils daily. 16 g 0   ipratropium-albuterol (DUONEB) 0.5-2.5 (3) MG/3ML SOLN Take 3 mLs by nebulization every 6 (six) hours as needed. 360 mL 0   rosuvastatin (CRESTOR) 5 MG tablet Take 1 tablet (5 mg total) by mouth daily. 90  tablet 1   zolpidem (AMBIEN) 10 MG tablet TAKE 1 TABLET BY MOUTH AT BEDTIME AS NEEDED FOR SLEEP *NEED APPOINTMENT FOR FURTHER FILLS* 30 tablet 2   No current facility-administered medications for this visit.    OBJECTIVE: There were no vitals filed for this visit.    There is no height or weight on file to calculate BMI.    ECOG FS:0 - Asymptomatic  General: Well-developed, well-nourished, no acute distress. HEENT: Normocephalic. Neuro: Alert, answering all questions appropriately. Cranial nerves grossly intact. Psych: Normal affect.   LAB RESULTS:  Lab Results  Component Value Date   NA 140 09/28/2022   K 4.2 09/28/2022   CL 104 09/28/2022   CO2 21 09/28/2022   GLUCOSE 97 09/28/2022   BUN 16 09/28/2022   CREATININE 0.97 09/28/2022   CALCIUM 9.5 09/28/2022   PROT 6.8 09/28/2022   ALBUMIN 4.5 09/28/2022   AST 20 09/28/2022   ALT 18 09/28/2022   ALKPHOS 78 09/28/2022   BILITOT 0.5 09/28/2022   GFRNONAA 59 (L) 11/06/2020   GFRAA 68 11/06/2020    Lab Results  Component Value Date   WBC 3.8 09/28/2022   NEUTROABS  2.1 09/28/2022   HGB 14.3 09/28/2022   HCT 42.0 09/28/2022   MCV 85 09/28/2022   PLT 220 09/28/2022     STUDIES: CT Chest Wo Contrast  Result Date: 11/24/2022 CLINICAL DATA:  Shortness of breath on exertion for 2-3 months. Exposure to hazardous chemicals noted in the provided clinical history. Patient has history of right breast cancer in 2021 status post lumpectomy and adjuvant radiation. EXAM: CT CHEST WITHOUT CONTRAST TECHNIQUE: Multidetector CT imaging of the chest was performed following the standard protocol without IV contrast. RADIATION DOSE REDUCTION: This exam was performed according to the departmental dose-optimization program which includes automated exposure control, adjustment of the mA and/or kV according to patient size and/or use of iterative reconstruction technique. COMPARISON:  None Available. FINDINGS: Cardiovascular: Multivessel coronary  artery calcification. Mild calcification at the aortic arch. Normal heart size. No pericardial effusion. Mediastinum/Nodes: No enlarged mediastinal or axillary lymph nodes. Thyroid gland, trachea, and esophagus demonstrate no significant findings. Lungs/Pleura: In the right upper lobe there is a 6 x 5 mm ground-glass nodule (5.5 mm mean diameter) on image 70 of series 3. Lungs are otherwise clear. No pleural effusion or pneumothorax. Upper Abdomen: No acute abnormality. Musculoskeletal: Skin thickening of the right breast is noted, consistent with history of radiation. No suspicious bone lesions identified. IMPRESSION: 1. A 5.5 mm mean diameter ground-glass nodule in the right upper lobe is favored to be inflammatory in etiology. Recommend follow-up CT chest in 3-6 months assess for resolution. 2. Multivessel coronary artery calcification. 3. Mild aortic atherosclerosis. Aortic Atherosclerosis (ICD10-I70.0). Electronically Signed   By: Ileana Roup M.D.   On: 11/24/2022 09:45   MM 3D SCREEN BREAST BILATERAL  Result Date: 11/16/2022 CLINICAL DATA:  Screening. EXAM: DIGITAL SCREENING BILATERAL MAMMOGRAM WITH TOMOSYNTHESIS AND CAD TECHNIQUE: Bilateral screening digital craniocaudal and mediolateral oblique mammograms were obtained. Bilateral screening digital breast tomosynthesis was performed. The images were evaluated with computer-aided detection. COMPARISON:  Previous exam(s). ACR Breast Density Category b: There are scattered areas of fibroglandular density. FINDINGS: There are no findings suspicious for malignancy. IMPRESSION: No mammographic evidence of malignancy. A result letter of this screening mammogram will be mailed directly to the patient. RECOMMENDATION: Screening mammogram in one year. (Code:SM-B-01Y) BI-RADS CATEGORY  1: Negative. Electronically Signed   By: Everlean Alstrom M.D.   On: 11/16/2022 09:54   DG Bone Density  Result Date: 11/14/2022 EXAM: DUAL X-RAY ABSORPTIOMETRY (DXA) FOR BONE  MINERAL DENSITY IMPRESSION: Your patient Aslin Farinas completed a BMD test on 11/14/2022 using the [employer] (software version: 14.10) manufactured by [employer]. The following summarizes the results of our evaluation. Technologist: SCE PATIENT BIOGRAPHICAL: Name: Colleen Richard, Colleen Richard Patient ID: [id number] Birth Date: [birth year] Height: 68.0 in. Gender: Female Exam Date: 11/14/2022 Weight: 228.1 lbs. Indications: Caucasian, Height Loss, High Risk Meds, History of Breast Cancer, History of Radiation, Hysterectomy, Postmenopausal Fractures: Treatments: Albuterol, Femara DENSITOMETRY RESULTS: Site      Region     Measured Date Measured Age WHO Classification Young Adult T-score BMD         %Change vs. Previous Significant Change (*) AP Spine L1-L4 (L3) 11/14/2022 55.8 Normal -0.1 1.176 g/cm2 -8.4% Yes AP Spine L1-L4 (L3) 05/18/2021 54.3 Normal 0.8 1.284 g/cm2 -2.8% Yes AP Spine L1-L4 (L3) 03/10/2020 53.1 Normal 1.1 1.321 g/cm2 - - DualFemur Neck Right 11/14/2022 55.8 Normal -0.5 0.962 g/cm2 -2.7% - DualFemur Neck Right 05/18/2021 54.3 Normal -0.4 0.989 g/cm2 -0.7% - DualFemur Neck Right 03/10/2020 53.1 Normal -0.3  0.996 g/cm2 - - DualFemur Total Mean 11/14/2022 55.8 Normal 0.1 1.020 g/cm2 -2.6% Yes DualFemur Total Mean 05/18/2021 54.3 Normal 0.3 1.047 g/cm2 -4.3% Yes DualFemur Total Mean 03/10/2020 53.1 Normal 0.7 1.094 g/cm2 - - ASSESSMENT: The BMD measured at Femur Neck Right is 0.962 g/cm2 with a T-score of -0.5. This patient is considered normal according to [facility]) criteria. The scan quality is good. L3 was excluded due to degenerative changes. Compared with prior study, there has been a significant decrease in the spine. Compared with prior study, there has been a significant decrease in the total hip. World Pharmacologist [facility]) criteria for post-menopausal, Caucasian Women: Normal:                   T-score at or above -1 SD Osteopenia/low bone mass: T-score  between -1 and -2.5 SD Osteoporosis:             T-score at or below -2.5 SD RECOMMENDATIONS: 1. All patients should optimize calcium and vitamin D intake. 2. Consider FDA-approved medical therapies in postmenopausal women and men aged 78 years and older, based on the following: a. A hip or vertebral(clinical or morphometric) fracture b. T-score < -2.5 at the femoral neck or spine after appropriate evaluation to exclude secondary causes c. Low bone mass (T-score between -1.0 and -2.5 at the femoral neck or spine) and a 10-year probability of a hip fracture > 3% or a 10-year probability of a major osteoporosis-related fracture > 20% based on the [place]-adapted WHO algorithm 3. Clinician judgment and/or patient preferences may indicate treatment for people with 10-year fracture probabilities above or below these levels FOLLOW-UP: People with diagnosed cases of osteoporosis or at high risk for fracture should have regular bone mineral density tests. For patients eligible for Medicare, routine testing is allowed once every 2 years. The testing frequency can be increased to one year for patients who have rapidly progressing disease, those who are receiving or discontinuing medical therapy to restore bone mass, or have additional risk factors. I have reviewed this report, and agree with the above findings. Mark A. Thornton Papas, M.D. [facility] Electronically Signed   By: Lavonia Dana M.D.   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  Given the low stage and low-grade of patient's tumor, she did not require Oncotype testing and did not require adjuvant chemotherapy.  She subsequently completed adjuvant XRT in June 2021.   Previously, [place] and LH indicated that patient is postmenopausal.  Patient could not tolerate letrozole and noticed no changes in her hot flashes with tamoxifen, so she switched back to anastrozole.  She wishes to continue anastrozole and will complete 5 years of treatment in June 2026.  Her most recent mammogram on November 14, 2022 was reported as BI-RADS 1.  Repeat in February 2025.  Patient will have video-assisted telemedicine visit in 6 months for routine evaluation.  Bone health: Patient's most recent bone mineral density on November 14, 2022 revealed a T-score of -0.4.  No intervention is needed.  Repeat bone mineral density in February 2026.   Hot flashes: Chronic and unchanged.  Continue anastrozole as above.  I spent a total of 20 minutes reviewing chart data,  face-to-face evaluation with the patient, counseling and coordination of care as detailed above.    Patient expressed understanding and was in agreement with this plan. She also understands that She can call clinic at any time with any questions, concerns, or complaints.    Cancer Staging  Malignant neoplasm of upper-outer quadrant of right female breast [facility]) Staging form: Breast, AJCC 8th Edition - Clinical stage from 01/03/2020: Stage IA (cT1b, cN0, cM0, G1, ER+, PR+, HER2-) - Signed by Lloyd Huger, MD on 01/03/2020 Stage prefix: Initial diagnosis Histologic grading system: 3 grade system   Lloyd Huger, MD   12/08/2022 4:46 PM

## 2022-12-13 ENCOUNTER — Other Ambulatory Visit: Payer: Self-pay | Admitting: Nurse Practitioner

## 2022-12-13 MED ORDER — ANASTROZOLE 1 MG PO TABS: 1.0000 mg | ORAL_TABLET | Freq: Every day | ORAL | 3 refills | Status: DC

## 2023-01-17 ENCOUNTER — Encounter: Payer: Self-pay | Admitting: Nurse Practitioner

## 2023-01-17 DIAGNOSIS — R911 Solitary pulmonary nodule: Secondary | ICD-10-CM

## 2023-01-20 ENCOUNTER — Telehealth: Payer: Self-pay | Admitting: Nurse Practitioner

## 2023-01-20 NOTE — Telephone Encounter (Signed)
Pulmonary Disease referral sent via [employer] to Dr. Vida Rigger with [facility]

## 2023-02-03 ENCOUNTER — Telehealth: Payer: Self-pay | Admitting: Nurse Practitioner

## 2023-02-03 NOTE — Telephone Encounter (Signed)
Pulmonary appointment>> 02/16/2023 @ [facility]

## 2023-02-07 ENCOUNTER — Ambulatory Visit: Payer: 59 | Attending: Cardiovascular Disease | Admitting: Cardiovascular Disease

## 2023-02-07 ENCOUNTER — Encounter: Payer: Self-pay | Admitting: Cardiovascular Disease

## 2023-02-07 ENCOUNTER — Other Ambulatory Visit: Payer: Self-pay | Admitting: Nurse Practitioner

## 2023-02-07 VITALS — BP 120/78 | HR 71 | Ht 69.0 in | Wt 228.2 lb

## 2023-02-07 DIAGNOSIS — R011 Cardiac murmur, unspecified: Secondary | ICD-10-CM | POA: Diagnosis not present

## 2023-02-07 DIAGNOSIS — E782 Mixed hyperlipidemia: Secondary | ICD-10-CM | POA: Diagnosis not present

## 2023-02-07 DIAGNOSIS — I2510 Atherosclerotic heart disease of native coronary artery without angina pectoris: Secondary | ICD-10-CM | POA: Diagnosis not present

## 2023-02-07 DIAGNOSIS — J4520 Mild intermittent asthma, uncomplicated: Secondary | ICD-10-CM

## 2023-02-07 DIAGNOSIS — F5101 Primary insomnia: Secondary | ICD-10-CM

## 2023-02-07 MED ORDER — ROSUVASTATIN CALCIUM 10 MG PO TABS: 10.0000 mg | ORAL_TABLET | Freq: Every day | ORAL | 3 refills | Status: DC

## 2023-02-07 NOTE — Progress Notes (Signed)
Cardiology Office Note   Date:  02/07/2023   ID:  Colleen Richard, DOB 29-Sep-1965, MRN [id number]  PCP:  Sallyanne Kuster, NP  Cardiologist:   Lorine Bears, MD   Chief Complaint  Patient presents with   New Patient (Initial Visit)    CAD C/o sob and heart flutter. Meds reviewed verbally with pt.      History of Present Illness: Colleen Richard is a 57 y.o. female who was referred by Sallyanne Kuster for evaluation of coronary artery calcifications. She has no prior cardiac history.  She had previous breast cancer treated with surgery and radiation therapy.  She is a lifelong non-smoker and has family history of coronary artery disease affecting multiple family members.  She is not diabetic and does not have hypertension. She recently had shortness of breath and was evaluated by CT scan of the lungs which showed incidental findings of mild aortic and coronary calcifications.  There was also groundglass nodule in the right upper lobe.  She has a pulmonary consultation scheduled in the near future. She denies chest pain at this time but does complain of shortness of breath with activities.  Her biggest complaint seems to be intermittent palpitations and skipping with no tachycardia.  She has a smart watch and has not been able to find significant arrhythmia.    Past Medical History:  Diagnosis Date   Abnormal mammogram    Asthma    Cancer ([facility]) 12/2019   breast cancer on right   Dyspnea    Hyperlipidemia    Hypertension    Menorrhagia    Migraines    Multiple thyroid nodules 12/2019   biopsied in past. now just following with yearly ultrasounds   Personal history of radiation therapy 2021   right breast ca   Reflux esophagitis    Shortness of breath     Past Surgical History:  Procedure Laterality Date   ABDOMINAL HYSTERECTOMY  2004   BREAST BIOPSY Right 08/26/2015   stereo, top hat clip    BREAST BIOPSY Right 12/10/2019   affirm, x marker,[facility]   BREAST EXCISIONAL  BIOPSY Bilateral 2010   neg   BREAST LUMPECTOMY Right 12/25/2019   [facility], low grade DCIS, Negative LNs   BREAST LUMPECTOMY WITH RADIOFREQUENCY TAG IDENTIFICATION Right 12/25/2019   Procedure: BREAST LUMPECTOMY WITH RADIOFREQUENCY TAG IDENTIFICATION;  Surgeon: Campbell Lerner, MD;  Location: [facility] ORS;  Service: General;  Laterality: Right;   CATARACT EXTRACTION, BILATERAL  01/2013, 2015   COLONOSCOPY WITH PROPOFOL N/A 11/03/2017   Procedure: COLONOSCOPY WITH PROPOFOL;  Surgeon: Pasty Spillers, MD;  Location: [facility] ENDOSCOPY;  Service: Endoscopy;  Laterality: N/A;   COLONOSCOPY WITH PROPOFOL N/A 12/01/2022   Procedure: COLONOSCOPY WITH PROPOFOL;  Surgeon: Toney Reil, MD;  Location[facility] ENDOSCOPY;  Service: Gastroenterology;  Laterality: N/A;   EYE SURGERY  2018   cataract and retinal detachment   EYE SURGERY Left 08/04/2021   hole in macular and detached retina right eye  05/31/2017   PARTIAL HYSTERECTOMY  2004   SENTINEL NODE BIOPSY Right 12/25/2019   Procedure: SENTINEL NODE BIOPSY;  Surgeon: Campbell Lerner, MD;  Location: [facility] ORS;  Service: General;  Laterality: Right;   TUBAL LIGATION       Current Outpatient Medications  Medication Sig Dispense Refill   albuterol (VENTOLIN HFA) 108 (90 Base) MCG/ACT inhaler Inhale 2 puffs into the lungs every 6 (six) hours as needed for wheezing or shortness of breath. 18 g 3   anastrozole (  ARIMIDEX) 1 MG tablet Take 1 tablet (1 mg total) by mouth daily. 90 tablet 3   aspirin-acetaminophen-caffeine (EXCEDRIN MIGRAINE) 250-250-65 MG tablet Take 2 tablets by mouth every 6 (six) hours as needed for headache.     CALCIUM CARBONATE-VITAMIN D PO Take 2 tablets by mouth.     ipratropium-albuterol (DUONEB) 0.5-2.5 (3) MG/3ML SOLN Take 3 mLs by nebulization every 6 (six) hours as needed. 360 mL 0   rosuvastatin (CRESTOR) 5 MG tablet Take 1 tablet (5 mg total) by mouth daily. 90 tablet 1   zolpidem (AMBIEN) 10 MG tablet TAKE 1 TABLET BY MOUTH AT  BEDTIME AS NEEDED FOR SLEEP *NEED APPOINTMENT FOR FURTHER FILLS* 30 tablet 2   No current facility-administered medications for this visit.    Allergies:   Amoxicillin    Social History:  The patient  reports that she has never smoked. She has never used smokeless tobacco. She reports that she does not drink alcohol and does not use drugs.   Family History:  The patient's family history includes Alzheimer's disease in her maternal aunt and maternal grandmother; Asthma in her mother; Atrial fibrillation in her mother; Breast cancer in an other family member; COPD in her mother; Heart attack in her brother; Heart disease in her brother and father.    ROS:  Please see the history of present illness.   Otherwise, review of systems are positive for none.   All other systems are reviewed and negative.    PHYSICAL EXAM: VS:  BP 120/78 (BP Location: Left Arm, Patient Position: Sitting, Cuff Size: Large)   Pulse 71   Ht 5\' 9"  (1.753 m)   Wt 228 lb 4 oz (103.5 kg)   SpO2 98%   BMI 33.71 kg/m  , BMI Body mass index is 33.71 kg/m. GEN: Well nourished, well developed, in no acute distress  HEENT: normal  Neck: no JVD, carotid bruits, or masses Cardiac: RRR; no rubs, or gallops,no edema .  2 out of 6 systolic murmur in the aortic area Respiratory:  clear to auscultation bilaterally, normal work of breathing GI: soft, nontender, nondistended, + BS MS: no deformity or atrophy  Skin: warm and dry, no rash Neuro:  Strength and sensation are intact Psych: euthymic mood, full affect   EKG:  EKG is ordered today. The ekg ordered today demonstrates normal sinus rhythm with bifascicular block.   Recent Labs: 09/28/2022: ALT 18; BUN 16; Creatinine, Ser 0.97; Hemoglobin 14.3; Platelets 220; Potassium 4.2; Sodium 140; TSH 1.050    Lipid Panel    Component Value Date/Time   CHOL 231 (H) 09/28/2022 1007   TRIG 91 09/28/2022 1007   HDL 59 09/28/2022 1007   CHOLHDL 3.9 09/28/2022 1007   LDLCALC  156 (H) 09/28/2022 1007      Wt Readings from Last 3 Encounters:  02/07/23 228 lb 4 oz (103.5 kg)  12/02/22 226 lb 6.4 oz (102.7 kg)  12/01/22 223 lb (101.2 kg)           02/07/2023    2:46 PM  PAD Screen  Previous PAD dx? No  Previous surgical procedure? No  Pain with walking? No  Feet/toe relief with dangling? No  Painful, non-healing ulcers? No  Extremities discolored? No      ASSESSMENT AND PLAN:  1.  Coronary artery calcifications: I reviewed the recent CT scan of the lungs which showed evidence of mild aortic and coronary artery calcifications.  She has no convincing symptoms of angina and I recommend healthy  lifestyle changes and aggressive treatment of risk factors.  2.  Hyperlipidemia: Recent lipid profile showed an LDL of 156.  Since that time, she was started on rosuvastatin 5 mg once daily and has been tolerating the medication.  I recommend getting her LDL below 70 due to the above.  I increased rosuvastatin to 10 mg daily.  3.  Cardiac murmur: Suggestive of aortic sclerosis.  Given her shortness of breath, will obtain an echocardiogram.    Disposition:   FU with me in 6 months  Signed,  Lorine Bears, MD  02/07/2023 2:55 PM    [facility]

## 2023-02-07 NOTE — Telephone Encounter (Signed)
Last 12/02/22 and next 8/24

## 2023-02-07 NOTE — Patient Instructions (Signed)
Medication Instructions:  INCREASE the Rosuvastatin to 10 mg once daily  *If you need a refill on your cardiac medications before your next appointment, please call your pharmacy*   Lab Work: None ordered If you have labs (blood work) drawn today and your tests are completely normal, you will receive your results only by: MyChart Message (if you have MyChart) OR A paper copy in the mail If you have any lab test that is abnormal or we need to change your treatment, we will call you to review the results.   Testing/Procedures: Your physician has requested that you have an echocardiogram. Echocardiography is a painless test that uses sound waves to create images of your heart. It provides your doctor with information about the size and shape of your heart and how well your heart's chambers and valves are working.   You may receive an ultrasound enhancing agent through an IV if needed to better visualize your heart during the echo. This procedure takes approximately one hour.  There are no restrictions for this procedure.  This will take place at [address], [place] [address]    Follow-Up: At [facility], you and your health needs are our priority.  As part of our continuing mission to provide you with exceptional heart care, we have created designated Provider Care Teams.  These Care Teams include your primary Cardiologist (physician) and Advanced Practice Providers (APPs -  Physician Assistants and Nurse Practitioners) who all work together to provide you with the care you need, when you need it.  We recommend signing up for the patient portal called "MyChart".  Sign up information is provided on this After Visit Summary.  MyChart is used to connect with patients for Virtual Visits (Telemedicine).  Patients are able to view lab/test results, encounter notes, upcoming appointments, etc.  Non-urgent messages can be sent to your provider as well.    To learn more about what you can do with MyChart, go to [email or web address].    Your next appointment:   6 month(s)  Provider:   You may see Lorine Bears, MD or one of the following Advanced Practice Providers on your designated Care Team:   Nicolasa Ducking, NP Eula Listen, PA-C Cadence Fransico Michael, PA-C Charlsie Quest, NP

## 2023-02-08 ENCOUNTER — Other Ambulatory Visit: Payer: Self-pay | Admitting: Cardiovascular Disease

## 2023-02-08 ENCOUNTER — Encounter: Payer: Self-pay | Admitting: Cardiovascular Disease

## 2023-02-08 DIAGNOSIS — R011 Cardiac murmur, unspecified: Secondary | ICD-10-CM

## 2023-02-08 DIAGNOSIS — I2510 Atherosclerotic heart disease of native coronary artery without angina pectoris: Secondary | ICD-10-CM

## 2023-02-08 DIAGNOSIS — E782 Mixed hyperlipidemia: Secondary | ICD-10-CM

## 2023-02-14 ENCOUNTER — Ambulatory Visit: Payer: 59 | Attending: Cardiovascular Disease

## 2023-02-14 DIAGNOSIS — R011 Cardiac murmur, unspecified: Secondary | ICD-10-CM | POA: Diagnosis not present

## 2023-02-17 LAB — ECHOCARDIOGRAM COMPLETE
AR max vel: 2.65 cm2
AV Area VTI: 2.75 cm2
AV Area mean vel: 2.59 cm2
AV Mean grad: 3 mmHg
AV Peak grad: 6.3 mmHg
Ao pk vel: 1.26 m/s
Area-P 1/2: 3.27 cm2
Calc EF: 57 %
S' Lateral: 2.8 cm
Single Plane A2C EF: 56.6 %
Single Plane A4C EF: 59.4 %

## 2023-02-23 ENCOUNTER — Ambulatory Visit: Payer: 59

## 2023-02-23 ENCOUNTER — Other Ambulatory Visit: Payer: Self-pay | Admitting: Pulmonary Disease

## 2023-02-23 DIAGNOSIS — R0609 Other forms of dyspnea: Secondary | ICD-10-CM

## 2023-02-23 DIAGNOSIS — R0602 Shortness of breath: Secondary | ICD-10-CM

## 2023-02-27 ENCOUNTER — Ambulatory Visit
Admission: RE | Admit: 2023-02-27 | Discharge: 2023-02-27 | Disposition: A | Discharge status: Home / Self Care | Payer: 59 | Source: Ambulatory Visit | Attending: Pulmonary Disease | Admitting: Pulmonary Disease

## 2023-02-27 DIAGNOSIS — R0609 Other forms of dyspnea: Secondary | ICD-10-CM | POA: Insufficient documentation

## 2023-02-27 DIAGNOSIS — R0602 Shortness of breath: Secondary | ICD-10-CM | POA: Diagnosis not present

## 2023-02-27 MED ORDER — IOHEXOL 350 MG/ML SOLN
75.0000 mL | Freq: Once | INTRAVENOUS | Status: AC | PRN
  Administered 2023-02-27: 75 mL via INTRAVENOUS

## 2023-04-10 ENCOUNTER — Other Ambulatory Visit: Payer: Self-pay | Admitting: Nurse Practitioner

## 2023-04-10 ENCOUNTER — Telehealth: Payer: Self-pay | Admitting: Medical Oncology

## 2023-04-10 DIAGNOSIS — F5101 Primary insomnia: Secondary | ICD-10-CM

## 2023-04-10 NOTE — Telephone Encounter (Signed)
WF [id number] Understanding and Predicting Breast Cancer Events after Treatment (UPBEAT)   Outgoing call: 1331 Year 3 follow-up call  Follow-up call to patient regarding her three years on study. Patient thanked for completing the study questionnaires online via study sent email. Patient denies having any hospitalizations in the past year.  Patient also denies any heart attacks, angioplasty, bypass operation, heart catheterizations, strokes or diagnosed with heart failure.   Patient thanked for her continued support of study. Patient denies having any questions at this time and was encouraged to call should she have questions/concerns.   Next follow-up to occur in one year's time.   Rexene Edison, RN, BSN, [facility] Clinical Research Nurse Lead 04/10/2023 1:41 PM

## 2023-05-01 ENCOUNTER — Encounter: Payer: Self-pay | Admitting: Radiation Oncology

## 2023-05-01 ENCOUNTER — Ambulatory Visit
Admission: RE | Admit: 2023-05-01 | Discharge: 2023-05-01 | Disposition: A | Discharge status: Home / Self Care | Payer: 59 | Source: Ambulatory Visit | Attending: Radiation Oncology | Admitting: Radiation Oncology

## 2023-05-01 VITALS — BP 145/88 | HR 81 | Temp 97.9°F | Resp 16 | Wt 231.0 lb

## 2023-05-01 DIAGNOSIS — Z79811 Long term (current) use of aromatase inhibitors: Secondary | ICD-10-CM | POA: Insufficient documentation

## 2023-05-01 DIAGNOSIS — R232 Flushing: Secondary | ICD-10-CM | POA: Diagnosis not present

## 2023-05-01 DIAGNOSIS — Z170 Estrogen receptor positive status [ER+]: Secondary | ICD-10-CM | POA: Insufficient documentation

## 2023-05-01 DIAGNOSIS — C50411 Malignant neoplasm of upper-outer quadrant of right female breast: Secondary | ICD-10-CM | POA: Diagnosis present

## 2023-05-01 DIAGNOSIS — Z923 Personal history of irradiation: Secondary | ICD-10-CM | POA: Diagnosis not present

## 2023-05-01 NOTE — Progress Notes (Signed)
Radiation Oncology Follow up Note  Name: Colleen Richard   Date:   05/01/2023 MRN:  [id number] DOB: Feb 28, 1966    This 57 y.o. female presents to the clinic today for 3-year follow-up status post whole breast radiation to her right breast for stage Ia ER/PR positive invasive mammary carcinoma.  REFERRING PROVIDER: Lyndon Code, MD  HPI: Patient is a 57 year old female now out over 3 years having pleated whole breast radiation to her right breast for ER positive stage I invasive mammary carcinoma seen today in routine follow-up she is doing well.  She specifically denies breast tenderness cough or bone pain.  She is currently on Arimidex having some hot flashes from that..  She has not had a mammogram since last February which I have reviewed which was BI-RADS 1 negative.  They are following a stable 6 mm groundglass opacity right upper lobe which has not changed over time.  COMPLICATIONS OF TREATMENT: none  FOLLOW UP COMPLIANCE: keeps appointments   PHYSICAL EXAM:  BP (!) 145/88 (BP Location: Left Arm, Patient Position: Sitting)   Pulse 81   Temp 97.9 F (36.6 C) (Tympanic)   Resp 16   Wt 231 lb (104.8 kg)   BMI 34.11 kg/m  Lungs are clear to A&P cardiac examination essentially unremarkable with regular rate and rhythm. No dominant mass or nodularity is noted in either breast in 2 positions examined. Incision is well-healed. No axillary or supraclavicular adenopathy is appreciated. Cosmetic result is excellent.  Well-developed well-nourished patient in NAD. HEENT reveals PERLA, EOMI, discs not visualized.  Oral cavity is clear. No oral mucosal lesions are identified. Neck is clear without evidence of cervical or supraclavicular adenopathy. Lungs are clear to A&P. Cardiac examination is essentially unremarkable with regular rate and rhythm without murmur rub or thrill. Abdomen is benign with no organomegaly or masses noted. Motor sensory and DTR levels are equal and symmetric in the upper  and lower extremities. Cranial nerves II through XII are grossly intact. Proprioception is intact. No peripheral adenopathy or edema is identified. No motor or sensory levels are noted. Crude visual fields are within normal range.  RADIOLOGY RESULTS: CT scan of mammograms reviewed compatible with above-stated findings  PLAN: Present time she is now out over 3 years with no evidence of disease.  I am going to discontinue follow-up care she continues follow-up care with medical oncology and her GP.  Be happy to reevaluate the patient at any time should that be indicated patient is to call with any concerns.  I would like to take this opportunity to thank you for allowing me to participate in the care of your patient.Carmina Miller, MD

## 2023-05-09 ENCOUNTER — Ambulatory Visit: Payer: 59 | Admitting: Nurse Practitioner

## 2023-05-09 ENCOUNTER — Encounter: Payer: Self-pay | Admitting: Nurse Practitioner

## 2023-05-09 VITALS — BP 128/84 | HR 85 | Temp 97.8°F | Resp 16 | Ht 69.0 in | Wt 230.0 lb

## 2023-05-09 DIAGNOSIS — M85672 Other cyst of bone, left ankle and foot: Secondary | ICD-10-CM | POA: Diagnosis not present

## 2023-05-09 DIAGNOSIS — H9201 Otalgia, right ear: Secondary | ICD-10-CM

## 2023-05-09 DIAGNOSIS — F5101 Primary insomnia: Secondary | ICD-10-CM | POA: Diagnosis not present

## 2023-05-09 MED ORDER — ZOLPIDEM TARTRATE 10 MG PO TABS
ORAL_TABLET | ORAL | 2 refills | Status: DC
Start: 2023-05-09 — End: 2023-07-12

## 2023-05-09 NOTE — Progress Notes (Signed)
[facility] [address] [place], [place] [address]  Internal MEDICINE  Office Visit Note  Patient Name: Colleen Richard  [id number]  [id number]  Date of Service: 05/09/2023  Chief Complaint  Patient presents with   Follow-up   Hypertension   Hyperlipidemia   Ear Pain    Right ear Since sunday    HPI Colleen Richard presents for a follow-up visit for right ear pain, bump on left foot and insomnia  Right ear pain - started 2 days ago, a little better today. Looks normal  Cyst on left foot  Insomnia -- still getting used to cpap, still using ambien.     Current Medication: Outpatient Encounter Medications as of 05/09/2023  Medication Sig Note   albuterol (VENTOLIN HFA) 108 (90 Base) MCG/ACT inhaler INHALE 2 PUFFS INTO THE LUNGS EVERY 6 HOURS AS NEEDED FOR WHEEZING OR SHORTNESS OF BREATH    anastrozole (ARIMIDEX) 1 MG tablet Take 1 tablet (1 mg total) by mouth daily.    aspirin-acetaminophen-caffeine (EXCEDRIN MIGRAINE) 250-250-65 MG tablet Take 2 tablets by mouth every 6 (six) hours as needed for headache.    CALCIUM CARBONATE-VITAMIN D PO Take 2 tablets by mouth.    Fluticasone-Umeclidin-Vilant (TRELEGY ELLIPTA) 200-62.5-25 MCG/ACT AEPB Inhale into the lungs.    ipratropium-albuterol (DUONEB) 0.5-2.5 (3) MG/3ML SOLN Take 3 mLs by nebulization every 6 (six) hours as needed. 05/03/2022: Do not send any more refills unless patient asks, she has many   rosuvastatin (CRESTOR) 10 MG tablet Take 1 tablet (10 mg total) by mouth daily.    [DISCONTINUED] zolpidem (AMBIEN) 10 MG tablet TAKE 1 TABLET BY MOUTH AT BEDTIME AS NEEDED FOR SLEEP *NEED APPOINTMENT FOR FURTHER FILLS*    zolpidem (AMBIEN) 10 MG tablet TAKE 1 TABLET BY MOUTH AT BEDTIME AS NEEDED FOR SLEEP *NEED APPOINTMENT FOR FURTHER FILLS*    No facility-administered encounter medications on file as of 05/09/2023.    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   BREAST LUMPECTOMY WITH RADIOFREQUENCY TAG IDENTIFICATION Right 12/25/2019   Procedure: BREAST LUMPECTOMY WITH RADIOFREQUENCY TAG IDENTIFICATION;  Surgeon: Campbell Lerner, MD;  Location: [facility] ORS;  Service: General;  Laterality: Right;   CATARACT EXTRACTION, BILATERAL  01/2013, 2015   COLONOSCOPY WITH PROPOFOL N/A 11/03/2017   Procedure: COLONOSCOPY WITH PROPOFOL;  Surgeon: Pasty Spillers, MD;  Location: [facility] ENDOSCOPY;  Service: Endoscopy;  Laterality: N/A;   COLONOSCOPY WITH PROPOFOL N/A 12/01/2022   Procedure: COLONOSCOPY WITH PROPOFOL;  Surgeon: Toney Reil, MD;  Location: [facility] ENDOSCOPY;  Service: Gastroenterology;  Laterality: N/A;   EYE SURGERY  2018   cataract and retinal detachment   EYE SURGERY Left 08/04/2021   hole in macular and detached retina right eye  05/31/2017   PARTIAL HYSTERECTOMY  2004   SENTINEL NODE BIOPSY Right 12/25/2019   Procedure: SENTINEL NODE BIOPSY;  Surgeon: Campbell Lerner, MD;  Location: [facility] ORS;  Service: General;  Laterality: Right;   TUBAL LIGATION      Medical History: Past Medical History:  Diagnosis Date   Abnormal mammogram    Asthma    Cancer (HCC) 12/2019   breast cancer on right   Dyspnea  Hyperlipidemia    Hypertension    Menorrhagia    Migraines    Multiple thyroid nodules 12/2019   biopsied in past. now just following with yearly ultrasounds   Personal history of radiation therapy 2021   right breast ca   Reflux esophagitis    Shortness of breath     Family History: Family History  Problem Relation Age of Onset   COPD Mother    Asthma Mother    Atrial fibrillation Mother    Heart disease Father    Heart disease Brother    Heart attack Brother    Alzheimer's disease Maternal  Aunt    Alzheimer's disease Maternal Grandmother    Breast cancer Other     Social History   Socioeconomic History   Marital status: Married    Spouse name: Linwood   Number of children: Not on file   Years of education: Not on file   Highest education level: Not on file  Occupational History   Not on file  Tobacco Use   Smoking status: Never   Smokeless tobacco: Never  Vaping Use   Vaping status: Never Used  Substance and Sexual Activity   Alcohol use: No   Drug use: No   Sexual activity: Yes  Other Topics Concern   Not on file  Social History Narrative   Patient lives husband in her home.   Social Determinants of Health   Financial Resource Strain: Low Risk  (02/11/2023)   Received from [facility], [facility]   Overall Financial Resource Strain (CARDIA)    Difficulty of Paying Living Expenses: Not hard at all  Food Insecurity: No Food Insecurity (02/11/2023)   Received from [facility], [facility]   Hunger Vital Sign    Worried About Running Out of Food in the Last Year: Never true    Ran Out of Food in the Last Year: Never true  Transportation Needs: No Transportation Needs (02/11/2023)   Received [facility], [facility]   [facility] - Transportation    In the past 12 months, has lack of transportation kept you from medical appointments or from getting medications?: No    Lack of Transportation (Non-Medical): No  Physical Activity: Not on file  Stress: Not on file  Social Connections: Not on file  Intimate Partner Violence: Not on file      Review of Systems  Constitutional:  Negative for chills, diaphoresis, fatigue and unexpected weight change.  HENT:  Negative for congestion, postnasal drip, rhinorrhea, sneezing and sore throat.   Eyes:  Negative for redness.  Respiratory:  Negative for cough, chest tightness and shortness of breath.    Cardiovascular: Negative.  Negative for chest pain and palpitations.  Gastrointestinal:  Negative for abdominal pain, constipation, diarrhea, nausea and vomiting.  Genitourinary:  Negative for dysuria and frequency.  Musculoskeletal: Negative.  Negative for arthralgias, back pain, joint swelling and neck pain.  Skin:  Negative for rash.  Neurological: Negative.  Negative for tremors and numbness.  Hematological:  Negative for adenopathy. Does not bruise/bleed easily.  Psychiatric/Behavioral:  Negative for behavioral problems (Depression), decreased concentration, self-injury, sleep disturbance and suicidal ideas. The patient is not nervous/anxious.     Vital Signs: BP 128/84   Pulse 85   Temp 97.8 F (36.6 C)   Resp 16   Ht 5\' 9"  (1.753 m)   Wt 230 lb (104.3 kg)   SpO2 99%  BMI 33.97 kg/m    Physical Exam Vitals reviewed.  Constitutional:      General: She is not in acute distress.    Appearance: Normal appearance. She is obese. She is not ill-appearing.  HENT:     Head: Normocephalic and atraumatic.  Eyes:     Pupils: Pupils are equal, round, and reactive to light.  Cardiovascular:     Rate and Rhythm: Normal rate and regular rhythm.  Pulmonary:     Effort: Pulmonary effort is normal. No respiratory distress.  Musculoskeletal:     Left foot: Deformity (bump on dorsal part of lateral foot.) present.  Feet:     Right foot:     Skin integrity: Skin integrity normal.     Left foot:     Skin integrity: Skin integrity normal.  Neurological:     Mental Status: She is alert and oriented to person, place, and time.  Psychiatric:        Mood and Affect: Mood normal.        Behavior: Behavior normal.        Assessment/Plan: 1. Bone cyst of left foot Referred to podiatry - Ambulatory referral to Podiatry  2. Right ear pain Ear looks normal, feels better today per patient, will try OTC allergy medication.   3. Primary insomnia Continue ambien as prescribed.  -  zolpidem (AMBIEN) 10 MG tablet; TAKE 1 TABLET BY MOUTH AT BEDTIME AS NEEDED FOR SLEEP *NEED APPOINTMENT FOR FURTHER FILLS*  Dispense: 30 tablet; Refill: 2   General Counseling: Colleen Richard verbalizes understanding of the findings of todays visit and agrees with plan of treatment. I have discussed any further diagnostic evaluation that may be needed or ordered today. We also reviewed her medications today. she has been encouraged to call the office with any questions or concerns that should arise related to todays visit.    Orders Placed This Encounter  Procedures   Ambulatory referral to Podiatry    Meds ordered this encounter  Medications   zolpidem (AMBIEN) 10 MG tablet    Sig: TAKE 1 TABLET BY MOUTH AT BEDTIME AS NEEDED FOR SLEEP *NEED APPOINTMENT FOR FURTHER FILLS*    Dispense:  30 tablet    Refill:  2    Return in about 3 months (around 08/02/2023) for  F/U, Colleen Richard PCP ambien refills. .   Total time spent:30 Minutes Time spent includes review of chart, medications, test results, and follow up plan with the patient.   [place] Controlled Substance Database was reviewed by me.  This patient was seen by Sallyanne Kuster, FNP-C in collaboration with Dr. Beverely Risen as a part of collaborative care agreement.   Colleen Patella R. Tedd Sias, MSN, FNP-C Internal medicine

## 2023-06-12 ENCOUNTER — Inpatient Hospital Stay: Payer: 59 | Attending: Oncology | Admitting: Oncology

## 2023-06-12 DIAGNOSIS — C50411 Malignant neoplasm of upper-outer quadrant of right female breast: Secondary | ICD-10-CM | POA: Diagnosis not present

## 2023-06-12 DIAGNOSIS — Z170 Estrogen receptor positive status [ER+]: Secondary | ICD-10-CM

## 2023-06-12 NOTE — Progress Notes (Signed)
[facility]  [phone] [phone]) [phone]  ID: Colleen Richard OB: 07/06/1966  MR#: [id number]  KGM#:[id number]  Patient Care Team: Sallyanne Kuster, NP as PCP - General (Nurse Practitioner) Iran Ouch, MD as PCP - Cardiology (Cardiology) Scarlett Presto, RN (Inactive) as Oncology Nurse Navigator Jeralyn Ruths, MD as Consulting Physician (Oncology) Carmina Miller, MD as Radiation Oncologist (Radiation Oncology) Lyndon Code, MD (Internal Medicine)  I connected with Colleen Richard on 06/12/23 at 11:00 AM EDT by video enabled telemedicine visit and verified that I am speaking with the correct person using two identifiers.   I discussed the limitations, risks, security and privacy concerns of performing an evaluation and management service by telemedicine and the availability of in-person appointments. I also discussed with the patient that there may be a patient responsible charge related to this service. The patient expressed understanding and agreed to proceed.   Other persons participating in the visit and their role in the encounter: Patient, MD.  Patient's location: Home. Provider's location: Clinic.  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  INTERVAL HISTORY: Patient agreed to video assisted telemedicine visit for her routine 51-month evaluation.  Visit was converted to telephone secondary to audio issues.  She continues to have significant daily hot flashes with her anastrozole, but otherwise feels well. She has no neurologic complaints.  She denies any recent fevers or illnesses.  She has a good appetite and denies weight loss.  She has no chest pain, shortness of breath, cough, or hemoptysis. She denies any nausea, vomiting, constipation, or diarrhea.  She has no urinary complaints.  Patient offers no further specific complaints today.     REVIEW OF SYSTEMS:   Review of Systems  Constitutional: Negative.  Negative for diaphoresis, fever, malaise/fatigue and weight loss.  Respiratory: Negative.  Negative for cough, hemoptysis and shortness of breath.   Cardiovascular: Negative.  Negative for chest pain and leg swelling.  Gastrointestinal: Negative.  Negative for abdominal pain.  Genitourinary: Negative.  Negative for dysuria.  Musculoskeletal: Negative.  Negative for back pain and myalgias.  Skin: Negative.  Negative for rash.  Neurological:  Positive for sensory change. Negative for dizziness, focal weakness, weakness and headaches.  Psychiatric/Behavioral: Negative.  The patient is not nervous/anxious.     As per HPI. Otherwise, a complete review of systems is negative.  PAST MEDICAL HISTORY: Past Medical History:  Diagnosis Date   Abnormal mammogram    Asthma    Cancer (HCC) 12/2019   breast cancer on right   Dyspnea    Hyperlipidemia    Hypertension    Menorrhagia    Migraines    Multiple thyroid nodules 12/2019   biopsied in past. now just following with yearly ultrasounds   Personal history of radiation therapy 2021   right breast ca   Reflux esophagitis    Shortness of breath     PAST SURGICAL HISTORY: Past Surgical History:  Procedure Laterality Date   ABDOMINAL HYSTERECTOMY  2004   BREAST BIOPSY Right 08/26/2015   stereo, top hat clip    BREAST BIOPSY Right 12/10/2019   affirm, x marker,[facility]   BREAST EXCISIONAL BIOPSY Bilateral 2010   neg   BREAST LUMPECTOMY Right 12/25/2019   [facility], low grade DCIS, Negative LNs   BREAST LUMPECTOMY WITH RADIOFREQUENCY TAG IDENTIFICATION Right 12/25/2019   Procedure: BREAST LUMPECTOMY WITH RADIOFREQUENCY TAG IDENTIFICATION;  Surgeon: Campbell Lerner, MD;  Location: [facility] ORS;  Service: General;  Laterality: Right;   CATARACT EXTRACTION, BILATERAL  01/2013, 2015   COLONOSCOPY WITH PROPOFOL N/A 11/03/2017   Procedure: COLONOSCOPY WITH PROPOFOL;  Surgeon:  Pasty Spillers, MD;  Location: [facility] ENDOSCOPY;  Service: Endoscopy;  Laterality: N/A;   COLONOSCOPY WITH PROPOFOL N/A 12/01/2022   Procedure: COLONOSCOPY WITH PROPOFOL;  Surgeon: Toney Reil, MD;  Location: [facility] ENDOSCOPY;  Service: Gastroenterology;  Laterality: N/A;   EYE SURGERY  2018   cataract and retinal detachment   EYE SURGERY Left 08/04/2021   hole in macular and detached retina right eye  05/31/2017   PARTIAL HYSTERECTOMY  2004   SENTINEL NODE BIOPSY Right 12/25/2019   Procedure: SENTINEL NODE BIOPSY;  Surgeon: Campbell Lerner, MD;  Location: [facility] ORS;  Service: General;  Laterality: Right;   TUBAL LIGATION      FAMILY HISTORY: Family History  Problem Relation Age of Onset   COPD Mother    Asthma Mother    Atrial fibrillation Mother    Heart disease Father    Heart disease Brother    Heart attack Brother    Alzheimer's disease Maternal Aunt    Alzheimer's disease Maternal Grandmother    Breast cancer Other     ADVANCED DIRECTIVES (Y/N):  N  HEALTH MAINTENANCE: Social History   Tobacco Use   Smoking status: Never   Smokeless tobacco: Never  Vaping Use   Vaping status: Never Used  Substance Use Topics   Alcohol use: No   Drug use: No     Colonoscopy:  PAP:  Bone density:  Lipid panel:  Allergies  Allergen Reactions   Amoxicillin Nausea And Vomiting    Current Outpatient Medications  Medication Sig Dispense Refill   albuterol (VENTOLIN HFA) 108 (90 Base) MCG/ACT inhaler INHALE 2 PUFFS INTO THE LUNGS EVERY 6 HOURS AS NEEDED FOR WHEEZING OR SHORTNESS OF BREATH 8.5 g 5   anastrozole (ARIMIDEX) 1 MG tablet Take 1 tablet (1 mg total) by mouth daily. 90 tablet 3   aspirin-acetaminophen-caffeine (EXCEDRIN MIGRAINE) 250-250-65 MG tablet Take 2 tablets by mouth every 6 (six) hours as needed for headache.     CALCIUM CARBONATE-VITAMIN D PO Take 2 tablets by mouth.     Fluticasone-Umeclidin-Vilant (TRELEGY ELLIPTA) 200-62.5-25 MCG/ACT AEPB Inhale  into the lungs.     ipratropium-albuterol (DUONEB) 0.5-2.5 (3) MG/3ML SOLN Take 3 mLs by nebulization every 6 (six) hours as needed. 360 mL 0   rosuvastatin (CRESTOR) 10 MG tablet Take 1 tablet (10 mg total) by mouth daily. 90 tablet 3   zolpidem (AMBIEN) 10 MG tablet TAKE 1 TABLET BY MOUTH AT BEDTIME AS NEEDED FOR SLEEP *NEED APPOINTMENT FOR FURTHER FILLS* 30 tablet 2   No current facility-administered medications for this visit.    OBJECTIVE: There were no vitals filed for this visit.    There is no height or weight on file to calculate BMI.    ECOG FS:0 - Asymptomatic  General: Well-developed, well-nourished, no acute distress. HEENT: Normocephalic. Neuro: Alert, answering all questions appropriately. Cranial nerves grossly intact. Psych: Normal affect.  LAB RESULTS:  Lab Results  Component Value Date   NA 140 09/28/2022   K 4.2 09/28/2022   CL 104 09/28/2022   CO2 21 09/28/2022   GLUCOSE 97 09/28/2022   BUN 16 09/28/2022   CREATININE 0.97 09/28/2022   CALCIUM 9.5 09/28/2022   PROT 6.8 09/28/2022   ALBUMIN 4.5 09/28/2022   AST 20 09/28/2022   ALT 18 09/28/2022  ALKPHOS 78 09/28/2022   BILITOT 0.5 09/28/2022   GFRNONAA 59 (L) 11/06/2020   GFRAA 68 11/06/2020    Lab Results  Component Value Date   WBC 3.8 09/28/2022   NEUTROABS 2.1 09/28/2022   HGB 14.3 09/28/2022   HCT 42.0 09/28/2022   MCV 85 09/28/2022   PLT 220 09/28/2022     STUDIES: No results found.  ASSESSMENT: Pathologic stage Ia ER/PR positive, HER-2 negative invasive carcinoma of the upper outer quadrant of the right breast.  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  Given the low stage and low-grade of patient's tumor, she did not require Oncotype testing and did not require adjuvant chemotherapy.  She subsequently completed adjuvant XRT in June 2021.  Previously,  FSH and LH indicated that patient is postmenopausal.  Patient could not tolerate letrozole and noticed no changes in her hot flashes with tamoxifen, so she switched back to anastrozole.  She continues to have significant hot flashes with anastrozole and is considering discontinuing treatment, but wishes to discuss it further with her family.  If she continues, she will complete 5 years of treatment in June 2026.  Her most recent mammogram on November 14, 2022 was reported as BI-RADS 1.  Repeat in February 2025.  Return to clinic in 6 months with video-assisted telemedicine visit.   Bone health: Patient's most recent bone mineral density on November 14, 2022 revealed a T-score of -0.4.  No intervention is needed.  Repeat bone mineral density in February 2026.   Hot flashes: Chronic and unchanged.  Patient has now taken greater than 3 years of anastrozole and is considering discontinuing.  She will call clinic with her decision after discussing with her family.  I provided 20 minutes of face-to-face video visit time during this encounter which included chart review, counseling, and coordination of care as documented above.   Patient expressed understanding and was in agreement with this plan. She also understands that She can call clinic at any time with any questions, concerns, or complaints.    Cancer Staging  Malignant neoplasm of upper-outer quadrant of right female breast [facility]) Staging Richard: Breast, AJCC 8th Edition - Clinical stage from 01/03/2020: Stage IA (cT1b, cN0, cM0, G1, ER+, PR+, HER2-) - Signed by Jeralyn Ruths, MD on 01/03/2020 Stage prefix: Initial diagnosis Histologic grading system: 3 grade system   Jeralyn Ruths, MD   06/12/2023 12:57 PM

## 2023-06-21 ENCOUNTER — Encounter: Payer: Self-pay | Admitting: Oncology

## 2023-06-29 ENCOUNTER — Encounter: Payer: Self-pay | Admitting: Podiatry

## 2023-06-29 ENCOUNTER — Ambulatory Visit: Payer: 59 | Admitting: Podiatry

## 2023-06-29 VITALS — BP 151/88 | HR 67

## 2023-06-29 DIAGNOSIS — M7989 Other specified soft tissue disorders: Secondary | ICD-10-CM

## 2023-06-29 NOTE — Progress Notes (Signed)
Subjective:  Patient ID: Colleen Richard, female    DOB: [birth year],  MRN: [id number]  Chief Complaint  Patient presents with   Foot Pain    "I have this little spot here.  I cross my legs and hit it, I get a sharp pain." (Lateral mass left)    57 y.o. female presents with the above complaint.  Patient presents with left lateral foot ganglion cyst/fibroma.  Patient states is slightly painful just wanted get it evaluated just wants to make sure nothing is concerning.  She has not seen and was prior to seeing me.  It does get bigger and smaller if she has been on her feet long enough.  She denies any other acute complaints pain scale 7 out of 10 dull aching nature.  Review history of the back she gets some sharp pain.   Review of Systems: Negative except as noted in the HPI. Denies N/V/F/Ch.  Past Medical History:  Diagnosis Date   Abnormal mammogram    Asthma    Cancer (HCC) 12/2019   breast cancer on right   Dyspnea    Hyperlipidemia    Hypertension    Menorrhagia    Migraines    Multiple thyroid nodules 12/2019   biopsied in past. now just following with yearly ultrasounds   Personal history of radiation therapy 2021   right breast ca   Reflux esophagitis    Shortness of breath     Current Outpatient Medications:    albuterol (VENTOLIN HFA) 108 (90 Base) MCG/ACT inhaler, INHALE 2 PUFFS INTO THE LUNGS EVERY 6 HOURS AS NEEDED FOR WHEEZING OR SHORTNESS OF BREATH, Disp: 8.5 g, Rfl: 5   aspirin-acetaminophen-caffeine (EXCEDRIN MIGRAINE) 250-250-65 MG tablet, Take 2 tablets by mouth every 6 (six) hours as needed for headache., Disp: , Rfl:    CALCIUM CARBONATE-VITAMIN D PO, Take 2 tablets by mouth., Disp: , Rfl:    Fluticasone-Umeclidin-Vilant (TRELEGY ELLIPTA) 200-62.5-25 MCG/ACT AEPB, Inhale into the lungs., Disp: , Rfl:    ipratropium-albuterol (DUONEB) 0.5-2.5 (3) MG/3ML SOLN, Take 3 mLs by nebulization every 6 (six) hours as needed., Disp: 360 mL, Rfl: 0   rosuvastatin  (CRESTOR) 10 MG tablet, Take 1 tablet (10 mg total) by mouth daily., Disp: 90 tablet, Rfl: 3   zolpidem (AMBIEN) 10 MG tablet, TAKE 1 TABLET BY MOUTH AT BEDTIME AS NEEDED FOR SLEEP *NEED APPOINTMENT FOR FURTHER FILLS*, Disp: 30 tablet, Rfl: 2   anastrozole (ARIMIDEX) 1 MG tablet, Take 1 tablet (1 mg total) by mouth daily., Disp: 90 tablet, Rfl: 3  Social History   Tobacco Use  Smoking Status Never  Smokeless Tobacco Never    Allergies  Allergen Reactions   Amoxicillin Nausea And Vomiting   Objective:   Vitals:   06/29/23 0935  BP: (!) 151/88  Pulse: 67   There is no height or weight on file to calculate BMI. Constitutional Well developed. Well nourished.  Vascular Dorsalis pedis pulses palpable bilaterally. Posterior tibial pulses palpable bilaterally. Capillary refill normal to all digits.  No cyanosis or clubbing noted. Pedal hair growth normal.  Neurologic Normal speech. Oriented to person, place, and time. Epicritic sensation to light touch grossly present bilaterally.  Dermatologic Left lateral soft tissue mass mobile hard indurated negative transilluminates noted to the lateral midfoot.  Correlating with the peroneal tendon.  No pain with dorsiflexion eversion of the foot.  Orthopedic: Normal joint ROM without pain or crepitus bilaterally. No visible deformities. No bony tenderness.   Radiographs: None Assessment:  1. Soft tissue mass    Plan:  Patient was evaluated and treated and all questions answered.  Left lateral soft tissue mass likely ganglion cyst versus fibroma -All questions and concerns were discussed with the patient in extensive detail. -I discussed with her that eventually she would benefit from surgical excision of this mass.  For now patient is able to tolerate and manage the pain she will think about the surgical option and will get back to me when she is ready. -I discussed shoe gear modification with her as well.  No follow-ups on file.

## 2023-07-11 ENCOUNTER — Other Ambulatory Visit: Payer: Self-pay | Admitting: Nurse Practitioner

## 2023-07-11 DIAGNOSIS — F5101 Primary insomnia: Secondary | ICD-10-CM

## 2023-07-11 NOTE — Telephone Encounter (Signed)
Please review

## 2023-08-09 ENCOUNTER — Ambulatory Visit: Payer: 59 | Admitting: Nurse Practitioner

## 2023-08-10 ENCOUNTER — Ambulatory Visit: Payer: 59 | Attending: Cardiovascular Disease | Admitting: Cardiovascular Disease

## 2023-08-10 ENCOUNTER — Encounter: Payer: Self-pay | Admitting: Cardiovascular Disease

## 2023-08-10 VITALS — BP 120/78 | HR 71 | Ht 69.0 in | Wt 232.5 lb

## 2023-08-10 DIAGNOSIS — E782 Mixed hyperlipidemia: Secondary | ICD-10-CM | POA: Diagnosis not present

## 2023-08-10 DIAGNOSIS — I2510 Atherosclerotic heart disease of native coronary artery without angina pectoris: Secondary | ICD-10-CM

## 2023-08-10 NOTE — Progress Notes (Signed)
Cardiology Office Note   Date:  08/10/2023   ID:  Colleen Richard, DOB [birth year], MRN [id number]  PCP:  Sallyanne Kuster, NP  Cardiologist:   Lorine Bears, MD   Chief Complaint  Patient presents with   Follow-up    6 month f/u no complaints today. Meds reviewed verbally with pt.      History of Present Illness: Colleen Richard is a 57 y.o. female who is here today for follow-up visit regarding coronary artery atherosclerosis noted on CT scan.   She had previous breast cancer treated with surgery and radiation therapy.  She is a lifelong non-smoker and has family history of coronary artery disease affecting multiple family members.  She is not diabetic and does not have hypertension. CT images in March 2024 showed incidental findings of mild aortic and coronary calcifications.  There was also groundglass nodule in the right upper lobe.  She had pulmonary evaluation done and was placed on Trelegy with improvement in symptoms.   She complained of palpitations suggestive of premature beats.  She now reports almost resolution of symptoms.  She had an echocardiogram done during her initial evaluation which showed normal LV systolic function with no significant valvular abnormalities. She is known to have hyperlipidemia and during last visit, increase rosuvastatin to 10 mg daily. She is now using CPAP for sleep apnea. She is motivated and determined to resume exercise on a regular basis.    Past Medical History:  Diagnosis Date   Abnormal mammogram    Asthma    Cancer (HCC) 12/2019   breast cancer on right   Dyspnea    Hyperlipidemia    Hypertension    Menorrhagia    Migraines    Multiple thyroid nodules 12/2019   biopsied in past. now just following with yearly ultrasounds   Personal history of radiation therapy 2021   right breast ca   Reflux esophagitis    Shortness of breath     Past Surgical History:  Procedure Laterality Date   ABDOMINAL HYSTERECTOMY  2004    BREAST BIOPSY Right 08/26/2015   stereo, top hat clip    BREAST BIOPSY Right 12/10/2019   affirm, x marker,[facility]   BREAST EXCISIONAL BIOPSY Bilateral 2010   neg   BREAST LUMPECTOMY Right 12/25/2019   [facility], low grade DCIS, Negative LNs   BREAST LUMPECTOMY WITH RADIOFREQUENCY TAG IDENTIFICATION Right 12/25/2019   Procedure: BREAST LUMPECTOMY WITH RADIOFREQUENCY TAG IDENTIFICATION;  Surgeon: Campbell Lerner, MD;  Location: [facility] ORS;  Service: General;  Laterality: Right;   CATARACT EXTRACTION, BILATERAL  01/2013, 2015   COLONOSCOPY WITH PROPOFOL N/A 11/03/2017   Procedure: COLONOSCOPY WITH PROPOFOL;  Surgeon: Pasty Spillers, MD;  Location: [facility] ENDOSCOPY;  Service: Endoscopy;  Laterality: N/A;   COLONOSCOPY WITH PROPOFOL N/A 12/01/2022   Procedure: COLONOSCOPY WITH PROPOFOL;  Surgeon: Toney Reil, MD;  Location: [facility] END[facility]COPY;  Service: Gastroenterology;  Laterality: N/A;   EYE SURGERY  2018   cataract and retinal detachment   EYE SURGERY Left 08/04/2021   hole in macular and detached retina right eye  05/31/2017   PARTIAL HYSTERECTOMY  2004   SENTINEL NODE BIOPSY Right 12/25/2019   Procedure: SENTINEL NODE BIOPSY;  Surgeon: Campbell Lerner, MD;  Location: [facility] ORS;  Service: General;  Laterality: Right;   TUBAL LIGATION       Current Outpatient Medications  Medication Sig Dispense Refill   albuterol (VENTOLIN HFA) 108 (90 Base) MCG/ACT inhaler INHALE 2 PUFFS INTO THE LUNGS  EVERY 6 HOURS AS NEEDED FOR WHEEZING OR SHORTNESS OF BREATH 8.5 g 5   aspirin-acetaminophen-caffeine (EXCEDRIN MIGRAINE) 250-250-65 MG tablet Take 2 tablets by mouth every 6 (six) hours as needed for headache.     CALCIUM CARBONATE-VITAMIN D PO Take 2 tablets by mouth.     Fluticasone-Umeclidin-Vilant (TRELEGY ELLIPTA) 200-62.5-25 MCG/ACT AEPB Inhale into the lungs.     ipratropium-albuterol (DUONEB) 0.5-2.5 (3) MG/3ML SOLN Take 3 mLs by nebulization every 6 (six) hours as needed. 360 mL 0   rosuvastatin  (CRESTOR) 10 MG tablet Take 1 tablet (10 mg total) by mouth daily. 90 tablet 3   zolpidem (AMBIEN) 10 MG tablet TAKE 1 TABLET BY MOUTH AT BEDTIME AS NEEDED FOR SLEEP 30 tablet 0   No current facility-administered medications for this visit.    Allergies:   Amoxicillin    Social History:  The patient  reports that she has never smoked. She has never used smokeless tobacco. She reports that she does not drink alcohol and does not use drugs.   Family History:  The patient's family history includes Alzheimer's disease in her maternal aunt and maternal grandmother; Asthma in her mother; Atrial fibrillation in her mother; Breast cancer in an other family member; COPD in her mother; Heart attack in her brother; Heart disease in her brother and father.    ROS:  Please see the history of present illness.   Otherwise, review of systems are positive for none.   All other systems are reviewed and negative.    PHYSICAL EXAM: VS:  BP 120/78 (BP Location: Left Arm, Patient Position: Sitting, Cuff Size: Large)   Pulse 71   Ht 5\' 9"  (1.753 m)   Wt 232 lb 8 oz (105.5 kg)   SpO2 98%   BMI 34.33 kg/m  , BMI Body mass index is 34.33 kg/m. GEN: Well nourished, well developed, in no acute distress  HEENT: normal  Neck: no JVD, carotid bruits, or masses Cardiac: RRR; no rubs, or gallops,no edema .  1 out of 6 systolic murmur in the aortic area Respiratory:  clear to auscultation bilaterally, normal work of breathing GI: soft, nontender, nondistended, + BS MS: no deformity or atrophy  Skin: warm and dry, no rash Neuro:  Strength and sensation are intact Psych: euthymic mood, full affect   EKG:  EKG is ordered today. The ekg ordered today demonstrates: Normal sinus rhythm Low voltage QRS Right bundle branch block Left anterior fascicular block Bifascicular block    Recent Labs: 09/28/2022: ALT 18; BUN 16; Creatinine, Ser 0.97; Hemoglobin 14.3; Platelets 220; Potassium 4.2; Sodium 140; TSH 1.050     Lipid Panel    Component Value Date/Time   CHOL 231 (H) 09/28/2022 1007   TRIG 91 09/28/2022 1007   HDL 59 09/28/2022 1007   CHOLHDL 3.9 09/28/2022 1007   LDLCALC 156 (H) 09/28/2022 1007      Wt Readings from Last 3 Encounters:  08/10/23 232 lb 8 oz (105.5 kg)  05/09/23 230 lb (104.3 kg)  05/01/23 231 lb (104.8 kg)           02/07/2023    2:46 PM  PAD Screen  Previous PAD dx? No  Previous surgical procedure? No  Pain with walking? No  Feet/toe relief with dangling? No  Painful, non-healing ulcers? No  Extremities discolored? No      ASSESSMENT AND PLAN:  1.  Coronary artery calcifications: Mild aortic and coronary calcifications overall on CT imaging.  No symptoms of angina.  Continue healthy lifestyle changes and treatment of hyperlipidemia.  2.  Hyperlipidemia: Most send lipid profile showed an LDL of 156.  Since then, rosuvastatin was started and she is tolerating the medication.  I requested a follow-up lipid and liver profile.      Disposition:   FU with me in 12 months  Signed,  Lorine Bears, MD  08/10/2023 8:10 AM     [facility]

## 2023-08-10 NOTE — Patient Instructions (Signed)
Medication Instructions:  No changes *If you need a refill on your cardiac medications before your next appointment, please call your pharmacy*   Lab Work: Your provider would like for you to have following labs drawn today Liver and Lipid.   If you have labs (blood work) drawn today and your tests are completely normal, you will receive your results only by: MyChart Message (if you have MyChart) OR A paper copy in the mail If you have any lab test that is abnormal or we need to change your treatment, we will call you to review the results.   Testing/Procedures: None ordered   Follow-Up: At [facility], you and your health needs are our priority.  As part of our continuing mission to provide you with exceptional heart care, we have created designated Provider Care Teams.  These Care Teams include your primary Cardiologist (physician) and Advanced Practice Providers (APPs -  Physician Assistants and Nurse Practitioners) who all work together to provide you with the care you need, when you need it.  We recommend signing up for the patient portal called "MyChart".  Sign up information is provided on this After Visit Summary.  MyChart is used to connect with patients for Virtual Visits (Telemedicine).  Patients are able to view lab/test results, encounter notes, upcoming appointments, etc.  Non-urgent messages can be sent to your provider as well.   To learn more about what you can do with MyChart, go to [email or web address].    Your next appointment:   12 month(s)  Provider:   You may see Lorine Bears, MD or one of the following Advanced Practice Providers on your designated Care Team:   Nicolasa Ducking, NP Eula Listen, PA-C Cadence Fransico Michael, PA-C Charlsie Quest, NP Carlos Levering, NP

## 2023-08-11 LAB — LIPID PANEL
Chol/HDL Ratio: 2.2 ratio (ref 0.0–4.4)
Cholesterol, Total: 147 mg/dL (ref 100–199)
HDL: 67 mg/dL (ref >39)
LDL Chol Calc (NIH): 63 mg/dL (ref 0–99)
Triglycerides: 95 mg/dL (ref 0–149)
VLDL Cholesterol Cal: 17 mg/dL (ref 5–40)

## 2023-08-11 LAB — HEPATIC FUNCTION PANEL
ALT: 16 [IU]/L (ref 0–32)
AST: 17 [IU]/L (ref 0–40)
Albumin: 4.4 g/dL (ref 3.8–4.9)
Alkaline Phosphatase: 91 [IU]/L (ref 44–121)
Bilirubin Total: 0.5 mg/dL (ref 0.0–1.2)
Bilirubin, Direct: 0.19 mg/dL (ref 0.00–0.40)
Total Protein: 6.7 g/dL (ref 6.0–8.5)

## 2023-08-15 ENCOUNTER — Ambulatory Visit: Payer: 59 | Admitting: Nurse Practitioner

## 2023-08-15 ENCOUNTER — Encounter: Payer: Self-pay | Admitting: Nurse Practitioner

## 2023-08-15 VITALS — BP 132/84 | HR 83 | Temp 98.0°F | Resp 16 | Ht 69.0 in | Wt 234.4 lb

## 2023-08-15 DIAGNOSIS — J4520 Mild intermittent asthma, uncomplicated: Secondary | ICD-10-CM | POA: Diagnosis not present

## 2023-08-15 DIAGNOSIS — F5101 Primary insomnia: Secondary | ICD-10-CM | POA: Diagnosis not present

## 2023-08-15 DIAGNOSIS — E782 Mixed hyperlipidemia: Secondary | ICD-10-CM

## 2023-08-15 MED ORDER — ZOLPIDEM TARTRATE 10 MG PO TABS
ORAL_TABLET | ORAL | 2 refills | Status: DC
Start: 2023-08-15 — End: 2023-11-13

## 2023-08-15 NOTE — Progress Notes (Signed)
[facility] [address] [place], [place] [address]  Internal MEDICINE  Office Visit Note  Patient Name: Colleen Richard  [id number]  [id number]  Date of Service: 08/15/2023  Chief Complaint  Patient presents with   Hyperlipidemia   Hypertension   Follow-up    HPI Colleen Richard presents for a follow-up visit for high cholesterol, asthma, and insomnia Cardiology increased cholesterol medication Was started on trelegy by Dr. Karna Christmas Stopped anastrozole per oncology Insomnia -- taking ambien, no issues, due for refills.     Current Medication: Outpatient Encounter Medications as of 08/15/2023  Medication Sig Note   albuterol (VENTOLIN HFA) 108 (90 Base) MCG/ACT inhaler INHALE 2 PUFFS INTO THE LUNGS EVERY 6 HOURS AS NEEDED FOR WHEEZING OR SHORTNESS OF BREATH    aspirin-acetaminophen-caffeine (EXCEDRIN MIGRAINE) 250-250-65 MG tablet Take 2 tablets by mouth every 6 (six) hours as needed for headache.    CALCIUM CARBONATE-VITAMIN D PO Take 2 tablets by mouth.    Fluticasone-Umeclidin-Vilant (TRELEGY ELLIPTA) 200-62.5-25 MCG/ACT AEPB Inhale into the lungs.    ipratropium-albuterol (DUONEB) 0.5-2.5 (3) MG/3ML SOLN Take 3 mLs by nebulization every 6 (six) hours as needed. 05/03/2022: Do not send any more refills unless patient asks, she has many   rosuvastatin (CRESTOR) 10 MG tablet Take 1 tablet (10 mg total) by mouth daily.    [DISCONTINUED] zolpidem (AMBIEN) 10 MG tablet TAKE 1 TABLET BY MOUTH AT BEDTIME AS NEEDED FOR SLEEP    zolpidem (AMBIEN) 10 MG tablet TAKE 1 TABLET BY MOUTH AT BEDTIME AS NEEDED FOR SLEEP    No facility-administered encounter medications on file as of 08/15/2023.    Surgical History: Past Surgical History:  Procedure Laterality Date   ABDOMINAL HYSTERECTOMY  2004   BREAST BIOPSY Right 08/26/2015   stereo, top hat clip    BREAST BIOPSY Right 12/10/2019   affirm, x marker,[facility]   BREAST EXCISIONAL BIOPSY Bilateral 2010   neg   BREAST LUMPECTOMY  Right 12/25/2019   [facility], low grade DCIS, Negative LNs   BREAST LUMPECTOMY WITH RADIOFREQUENCY TAG IDENTIFICATION Right 12/25/2019   Procedure: BREAST LUMPECTOMY WITH RADIOFREQUENCY TAG IDENTIFICATION;  Surgeon: Campbell Lerner, MD;  Location: [facility] ORS;  Service: General;  Laterality: Right;   CATARACT EXTRACTION, BILATERAL  01/2013, 2015   COLONOSCOPY WITH PROPOFOL N/A 11/03/2017   Procedure: COLONOSCOPY WITH PROPOFOL;  Surgeon: Pasty Spillers, MD;  Location: [facility] ENDOSCOPY;  Service: Endoscopy;  Laterality: N/A;   COLONOSCOPY WITH PROPOFOL N/A 12/01/2022   Procedure: COLONOSCOPY WITH PROPOFOL;  Surgeon: Toney Reil, MD;  Location: [facility] ENDOSCOPY;  Service: Gastroenterology;  Laterality: N/A;   EYE SURGERY  2018   cataract and retinal detachment   EYE SURGERY Left 08/04/2021   hole in macular and detached retina right eye  05/31/2017   PARTIAL HYSTERECTOMY  2004   SENTINEL NODE BIOPSY Right 12/25/2019   Procedure: SENTINEL NODE BIOPSY;  Surgeon: Campbell Lerner, MD;  Location: [facility] ORS;  Service: General;  Laterality: Right;   TUBAL LIGATION      Medical History: Past Medical History:  Diagnosis Date   Abnormal mammogram    Asthma    Cancer (HCC) 12/2019   breast cancer on right   Dyspnea    Hyperlipidemia    Hypertension    Menorrhagia    Migraines    Multiple thyroid nodules 12/2019   biopsied in past. now just following with yearly ultrasounds   Personal history of radiation therapy 2021   right breast ca   Reflux esophagitis  Shortness of breath     Family History: Family History  Problem Relation Age of Onset   COPD Mother    Asthma Mother    Atrial fibrillation Mother    Heart disease Father    Heart disease Brother    Heart attack Brother    Alzheimer's disease Maternal Aunt    Alzheimer's disease Maternal Grandmother    Breast cancer Other     Social History   Socioeconomic History   Marital status: Married    Spouse name: Linwood    Number of children: Not on file   Years of education: Not on file   Highest education level: Not on file  Occupational History   Not on file  Tobacco Use   Smoking status: Never   Smokeless tobacco: Never  Vaping Use   Vaping status: Never Used  Substance and Sexual Activity   Alcohol use: No   Drug use: No   Sexual activity: Yes  Other Topics Concern   Not on file  Social History Narrative   Patient lives husband in her home.   Social Determinants of Health   Financial Resource Strain: Low Risk  (02/11/2023)   Received from [facility], [facility]   Overall Financial Resource Strain (CARDIA)    Difficulty of Paying Living Expenses: Not hard at all  Food Insecurity: No Food Insecurity (02/11/2023)   Received from [facility], [facility]   Hunger Vital Sign    Worried About Running Out of Food in the Last Year: Never true    Ran Out of Food in the Last Year: Never true  Transportation Needs: No Transportation Needs (02/11/2023)   Received from [facility], [facility]   [facility] - Transportation    In the past 12 months, has lack of transportation kept you from medical appointments or from getting medications?: No    Lack of Transportation (Non-Medical): No  Physical Activity: Not on file  Stress: Not on file  Social Connections: Not on file  Intimate Partner Violence: Not on file      Review of Systems  Constitutional:  Negative for chills, diaphoresis, fatigue and unexpected weight change.  HENT:  Negative for congestion, postnasal drip, rhinorrhea, sneezing and sore throat.   Eyes:  Negative for redness.  Respiratory:  Negative for cough, chest tightness and shortness of breath.   Cardiovascular: Negative.  Negative for chest pain and palpitations.  Gastrointestinal:  Negative for abdominal pain, constipation, diarrhea, nausea and vomiting.  Genitourinary:   Negative for dysuria and frequency.  Musculoskeletal: Negative.  Negative for arthralgias, back pain, joint swelling and neck pain.  Skin:  Negative for rash.  Neurological: Negative.  Negative for tremors and numbness.  Hematological:  Negative for adenopathy. Does not bruise/bleed easily.  Psychiatric/Behavioral:  Positive for sleep disturbance. Negative for behavioral problems (Depression), decreased concentration, self-injury and suicidal ideas. The patient is not nervous/anxious.     Vital Signs: BP 132/84   Pulse 83   Temp 98 F (36.7 C)   Resp 16   Ht 5\' 9"  (1.753 m)   Wt 234 lb 6.4 oz (106.3 kg)   SpO2 98%   BMI 34.61 kg/m    Physical Exam Vitals reviewed.  Constitutional:      General: She is not in acute distress.    Appearance: Normal appearance. She is obese. She is not ill-appearing.  HENT:     Head: Normocephalic and  atraumatic.  Eyes:     Pupils: Pupils are equal, round, and reactive to light.  Cardiovascular:     Rate and Rhythm: Normal rate and regular rhythm.  Pulmonary:     Effort: Pulmonary effort is normal. No respiratory distress.  Neurological:     Mental Status: She is alert and oriented to person, place, and time.  Psychiatric:        Mood and Affect: Mood normal.        Behavior: Behavior normal.        Assessment/Plan: 1. Mixed hyperlipidemia Continue rosuvastatin as prescribed.   2. Mild intermittent asthma without complication Continue trelegy as prescribed by Dr. Karna Christmas, continue following up with him.   3. Primary insomnia Continue prn ambien as prescribed. Follow up in 3 months for additional refills  - zolpidem (AMBIEN) 10 MG tablet; TAKE 1 TABLET BY MOUTH AT BEDTIME AS NEEDED FOR SLEEP  Dispense: 30 tablet; Refill: 2   General Counseling: Airianna verbalizes understanding of the findings of todays visit and agrees with plan of treatment. I have discussed any further diagnostic evaluation that may be needed or ordered today. We  also reviewed her medications today. she has been encouraged to call the office with any questions or concerns that should arise related to todays visit.    No orders of the defined types were placed in this encounter.   Meds ordered this encounter  Medications   zolpidem (AMBIEN) 10 MG tablet    Sig: TAKE 1 TABLET BY MOUTH AT BEDTIME AS NEEDED FOR SLEEP    Dispense:  30 tablet    Refill:  2    For future refills, keep on file.    Return for has upcoming visit in february  for CPE and refills .   Total time spent:30 Minutes Time spent includes review of chart, medications, test results, and follow up plan with the patient.   [place] Controlled Substance Database was reviewed by me.  This patient was seen by Sallyanne Kuster, FNP-C in collaboration with Dr. Beverely Risen as a part of collaborative care agreement.   Divon Krabill R. Tedd Sias, MSN, FNP-C Internal medicine

## 2023-08-16 ENCOUNTER — Encounter: Payer: Self-pay | Admitting: Nurse Practitioner

## 2023-09-20 ENCOUNTER — Telehealth: Payer: 59

## 2023-09-20 DIAGNOSIS — J019 Acute sinusitis, unspecified: Secondary | ICD-10-CM | POA: Diagnosis not present

## 2023-09-20 MED ORDER — FLUTICASONE PROPIONATE 50 MCG/ACT NA SUSP
2.0000 | Freq: Every day | NASAL | 0 refills | Status: DC
Start: 2023-09-20 — End: 2023-11-13

## 2023-09-20 MED ORDER — AZITHROMYCIN 250 MG PO TABS
ORAL_TABLET | ORAL | 0 refills | Status: AC
Start: 2023-09-20 — End: 2023-09-25

## 2023-09-20 MED ORDER — PREDNISONE 10 MG (21) PO TBPK
ORAL_TABLET | ORAL | 0 refills | Status: DC
Start: 2023-09-20 — End: 2023-10-03

## 2023-09-20 NOTE — Progress Notes (Signed)
 Virtual Visit Consent   Colleen Richard, you are scheduled for a virtual visit with a  provider today. Just as with appointments in the office, your consent must be obtained to participate. Your consent will be active for this visit and any virtual visit you may have with one of our providers in the next 365 days. If you have a MyChart account, a copy of this consent can be sent to you electronically.  As this is a virtual visit, video technology does not allow for your provider to perform a traditional examination. This may limit your provider's ability to fully assess your condition. If your provider identifies any concerns that need to be evaluated in person or the need to arrange testing (such as labs, EKG, etc.), we will make arrangements to do so. Although advances in technology are sophisticated, we cannot ensure that it will always work on either your end or our end. If the connection with a video visit is poor, the visit may have to be switched to a telephone visit. With either a video or telephone visit, we are not always able to ensure that we have a secure connection.  By engaging in this virtual visit, you consent to the provision of healthcare and authorize for your insurance to be billed (if applicable) for the services provided during this visit. Depending on your insurance coverage, you may receive a charge related to this service.  I need to obtain your verbal consent now. Are you willing to proceed with your visit today? BRIGHTON DELIO has provided verbal consent on 09/20/2023 for a virtual visit (video or telephone). Colleen Richard, [place]  Date: 09/20/2023 10:35 AM  Virtual Visit via Video Note   I, Colleen Richard, connected with  Colleen Richard  ([id number], [birth year]) on 09/20/23 at 10:30 AM EST by a video-enabled telemedicine application and verified that I am speaking with the correct person using two identifiers.  Location: Patient: Virtual Visit Location Patient:  Home Provider: Virtual Visit Location Provider: Home Office   I discussed the limitations of evaluation and management by telemedicine and the availability of in person appointments. The patient expressed understanding and agreed to proceed.    History of Present Illness: Colleen Richard is a 58 y.o. who identifies as a female who was assigned female at birth, and is being seen today for c/o having symptoms of a sinus infectin.  Pt states pressure in forehead, drainage that is yellow color, low grade fever 99-99.2 and ears are popping.  Pt states symptoms on going since last Saturday and by Sunday was not able to sleep.  Pt states taking theraflu and day time cold medicines as well.   HPI: HPI  Problems:  Patient Active Problem List   Diagnosis Date Noted   History of colonic polyps 12/01/2022   Polyp of ascending colon 12/01/2022   Polyp of transverse colon 12/01/2022   Polyp of sigmoid colon 12/01/2022   Genetic testing 05/03/2022   Malignant neoplasm of upper-outer quadrant of right female breast (HCC) 12/14/2019   Encounter for general adult medical examination with abnormal findings 10/23/2019   Mild intermittent asthma without complication 10/23/2019   BMI 29.0-29.9,adult 10/23/2019   Dysuria 10/23/2019   Migraines 10/09/2018   Body mass index (BMI) of 31.0 to 31.9 in adult 10/09/2018   Acute non-recurrent pansinusitis 08/08/2018   Goiter diffuse, nontoxic 05/13/2018   Menopausal and perimenopausal disorder 05/13/2018   Vitamin D  deficiency 05/13/2018   Primary osteoarthritis of right shoulder  05/13/2018   Obesity (BMI 30.0-34.9) 12/19/2017   DDD (degenerative disc disease), cervical 12/19/2017   Screening for breast cancer    Benign neoplasm of ascending colon    Rectal inflammation    Acne 11/20/2012   Bronchospasm 11/20/2012   Family history of ischemic heart disease 11/20/2012   Insomnia 11/20/2012    Allergies:  Allergies  Allergen Reactions   Amoxicillin  Nausea  And Vomiting   Medications:  Current Outpatient Medications:    azithromycin  (ZITHROMAX ) 250 MG tablet, Take 2 tablets on day 1, then 1 tablet daily on days 2 through 5, Disp: 6 tablet, Rfl: 0   fluticasone  (FLONASE ) 50 MCG/ACT nasal spray, Place 2 sprays into both nostrils daily., Disp: 16 g, Rfl: 0   predniSONE  (STERAPRED UNI-PAK 21 TAB) 10 MG (21) TBPK tablet, Take following package directions., Disp: 21 tablet, Rfl: 0   albuterol  (VENTOLIN  HFA) 108 (90 Base) MCG/ACT inhaler, INHALE 2 PUFFS INTO THE LUNGS EVERY 6 HOURS AS NEEDED FOR WHEEZING OR SHORTNESS OF BREATH, Disp: 8.5 g, Rfl: 5   aspirin-acetaminophen -caffeine (EXCEDRIN MIGRAINE) 250-250-65 MG tablet, Take 2 tablets by mouth every 6 (six) hours as needed for headache., Disp: , Rfl:    CALCIUM  CARBONATE-VITAMIN D  PO, Take 2 tablets by mouth., Disp: , Rfl:    Fluticasone -Umeclidin-Vilant (TRELEGY ELLIPTA) 200-62.5-25 MCG/ACT AEPB, Inhale into the lungs., Disp: , Rfl:    ipratropium-albuterol  (DUONEB) 0.5-2.5 (3) MG/3ML SOLN, Take 3 mLs by nebulization every 6 (six) hours as needed., Disp: 360 mL, Rfl: 0   rosuvastatin  (CRESTOR ) 10 MG tablet, Take 1 tablet (10 mg total) by mouth daily., Disp: 90 tablet, Rfl: 3   zolpidem  (AMBIEN ) 10 MG tablet, TAKE 1 TABLET BY MOUTH AT BEDTIME AS NEEDED FOR SLEEP, Disp: 30 tablet, Rfl: 2  Observations/Objective: Patient is well-developed, well-nourished in no acute distress.  Resting comfortably at home.  Head is normocephalic, atraumatic.  No labored breathing.  Speech is clear and coherent with logical content.  Patient is alert and oriented at baseline.    Assessment and Plan: 1. Acute sinusitis, recurrence not specified, unspecified location (Primary) - fluticasone  (FLONASE ) 50 MCG/ACT nasal spray; Place 2 sprays into both nostrils daily.  Dispense: 16 g; Refill: 0 - predniSONE  (STERAPRED UNI-PAK 21 TAB) 10 MG (21) TBPK tablet; Take following package directions.  Dispense: 21 tablet; Refill:  0 - azithromycin  (ZITHROMAX ) 250 MG tablet; Take 2 tablets on day 1, then 1 tablet daily on days 2 through 5  Dispense: 6 tablet; Refill: 0  -Pt to follow up with PCP if symptoms persist or worsen.   Follow Up Instructions: I discussed the assessment and treatment plan with the patient. The patient was provided an opportunity to ask questions and all were answered. The patient agreed with the plan and demonstrated an understanding of the instructions.  A copy of instructions were sent to the patient via MyChart unless otherwise noted below.    The patient was advised to call back or seek an in-person evaluation if the symptoms worsen or if the condition fails to improve as anticipated.    Colleen Mater, PA-C

## 2023-09-20 NOTE — Patient Instructions (Signed)
 Colleen Richard Admire, thank you for joining Roosvelt Mater, PA-C for today's virtual visit.  While this provider is not your primary care provider (PCP), if your PCP is located in our provider database this encounter information will be shared with them immediately following your visit.   A [facility] MyChart account gives you access to today's visit and all your visits, tests, and labs performed at [facility]  click here if you don't have a [facility] MyChart account or go to [email or web address]  Consent: (Patient) Colleen Richard provided verbal consent for this virtual visit at the beginning of the encounter.  Current Medications:  Current Outpatient Medications:    azithromycin  (ZITHROMAX ) 250 MG tablet, Take 2 tablets on day 1, then 1 tablet daily on days 2 through 5, Disp: 6 tablet, Rfl: 0   fluticasone  (FLONASE ) 50 MCG/ACT nasal spray, Place 2 sprays into both nostrils daily., Disp: 16 g, Rfl: 0   predniSONE  (STERAPRED UNI-PAK 21 TAB) 10 MG (21) TBPK tablet, Take following package directions., Disp: 21 tablet, Rfl: 0   albuterol  (VENTOLIN  HFA) 108 (90 Base) MCG/ACT inhaler, INHALE 2 PUFFS INTO THE LUNGS EVERY 6 HOURS AS NEEDED FOR WHEEZING OR SHORTNESS OF BREATH, Disp: 8.5 g, Rfl: 5   aspirin-acetaminophen -caffeine (EXCEDRIN MIGRAINE) 250-250-65 MG tablet, Take 2 tablets by mouth every 6 (six) hours as needed for headache., Disp: , Rfl:    CALCIUM  CARBONATE-VITAMIN D  PO, Take 2 tablets by mouth., Disp: , Rfl:    Fluticasone -Umeclidin-Vilant (TRELEGY ELLIPTA) 200-62.5-25 MCG/ACT AEPB, Inhale into the lungs., Disp: , Rfl:    ipratropium-albuterol  (DUONEB) 0.5-2.5 (3) MG/3ML SOLN, Take 3 mLs by nebulization every 6 (six) hours as needed., Disp: 360 mL, Rfl: 0   rosuvastatin  (CRESTOR ) 10 MG tablet, Take 1 tablet (10 mg total) by mouth daily., Disp: 90 tablet, Rfl: 3   zolpidem  (AMBIEN ) 10 MG tablet, TAKE 1 TABLET BY MOUTH AT BEDTIME AS NEEDED FOR SLEEP, Disp: 30 tablet, Rfl: 2    Medications ordered in this encounter:  Meds ordered this encounter  Medications   fluticasone  (FLONASE ) 50 MCG/ACT nasal spray    Sig: Place 2 sprays into both nostrils daily.    Dispense:  16 g    Refill:  0   predniSONE  (STERAPRED UNI-PAK 21 TAB) 10 MG (21) TBPK tablet    Sig: Take following package directions.    Dispense:  21 tablet    Refill:  0    Please dispense one standard blister pack taper.   azithromycin  (ZITHROMAX ) 250 MG tablet    Sig: Take 2 tablets on day 1, then 1 tablet daily on days 2 through 5    Dispense:  6 tablet    Refill:  0     *If you need refills on other medications prior to your next appointment, please contact your pharmacy*  Follow-Up: Call back or seek an in-person evaluation if the symptoms worsen or if the condition fails to improve as anticipated.  [facility] [phone]  Other Instructions Sinus Infection, Adult A sinus infection, also called sinusitis, is inflammation of your sinuses. Sinuses are hollow spaces in the bones around your face. Your sinuses are located: Around your eyes. In the middle of your forehead. Behind your nose. In your cheekbones. Mucus normally drains out of your sinuses. When your nasal tissues become inflamed or swollen, mucus can become trapped or blocked. This allows bacteria, viruses, and fungi to grow, which leads to infection. Most infections of the sinuses are  caused by a virus. A sinus infection can develop quickly. It can last for up to 4 weeks (acute) or for more than 12 weeks (chronic). A sinus infection often develops after a cold. What are the causes? This condition is caused by anything that creates swelling in the sinuses or stops mucus from draining. This includes: Allergies. Asthma. Infection from bacteria or viruses. Deformities or blockages in your nose or sinuses. Abnormal growths in the nose (nasal polyps). Pollutants, such as chemicals or irritants in the air. Infection  from fungi. This is rare. What increases the risk? You are more likely to develop this condition if you: Have a weak body defense system (immune system). Do a lot of swimming or diving. Overuse nasal sprays. Smoke. What are the signs or symptoms? The main symptoms of this condition are pain and a feeling of pressure around the affected sinuses. Other symptoms include: Stuffy nose or congestion that makes it difficult to breathe through your nose. Thick yellow or greenish drainage from your nose. Tenderness, swelling, and warmth over the affected sinuses. A cough that may get worse at night. Decreased sense of smell and taste. Extra mucus that collects in the throat or the back of the nose (postnasal drip) causing a sore throat or bad breath. Tiredness (fatigue). Fever. How is this diagnosed? This condition is diagnosed based on: Your symptoms. Your medical history. A physical exam. Tests to find out if your condition is acute or chronic. This may include: Checking your nose for nasal polyps. Viewing your sinuses using a device that has a light (endoscope). Testing for allergies or bacteria. Imaging tests, such as an MRI or CT scan. In rare cases, a bone biopsy may be done to rule out more serious types of fungal sinus disease. How is this treated? Treatment for a sinus infection depends on the cause and whether your condition is chronic or acute. If caused by a virus, your symptoms should go away on their own within 10 days. You may be given medicines to relieve symptoms. They include: Medicines that shrink swollen nasal passages (decongestants). A spray that eases inflammation of the nostrils (topical intranasal corticosteroids). Rinses that help get rid of thick mucus in your nose (nasal saline washes). Medicines that treat allergies (antihistamines). Over-the-counter pain relievers. If caused by bacteria, your health care provider may recommend waiting to see if your symptoms  improve. Most bacterial infections will get better without antibiotic medicine. You may be given antibiotics if you have: A severe infection. A weak immune system. If caused by narrow nasal passages or nasal polyps, surgery may be needed. Follow these instructions at home: Medicines Take, use, or apply over-the-counter and prescription medicines only as told by your health care provider. These may include nasal sprays. If you were prescribed an antibiotic medicine, take it as told by your health care provider. Do not stop taking the antibiotic even if you start to feel better. Hydrate and humidify  Drink enough fluid to keep your urine pale yellow. Staying hydrated will help to thin your mucus. Use a cool mist humidifier to keep the humidity level in your home above 50%. Inhale steam for 10-15 minutes, 3-4 times a day, or as told by your health care provider. You can do this in the bathroom while a hot shower is running. Limit your exposure to cool or dry air. Rest Rest as much as possible. Sleep with your head raised (elevated). Make sure you get enough sleep each night. General instructions  Apply  a warm, moist washcloth to your face 3-4 times a day or as told by your health care provider. This will help with discomfort. Use nasal saline washes as often as told by your health care provider. Wash your hands often with soap and water to reduce your exposure to germs. If soap and water are not available, use hand sanitizer. Do not smoke. Avoid being around people who are smoking (secondhand smoke). Keep all follow-up visits. This is important. Contact a health care provider if: You have a fever. Your symptoms get worse. Your symptoms do not improve within 10 days. Get help right away if: You have a severe headache. You have persistent vomiting. You have severe pain or swelling around your face or eyes. You have vision problems. You develop confusion. Your neck is stiff. You have  trouble breathing. These symptoms may be an emergency. Get help right away. Call 911. Do not wait to see if the symptoms will go away. Do not drive yourself to the hospital. Summary A sinus infection is soreness and inflammation of your sinuses. Sinuses are hollow spaces in the bones around your face. This condition is caused by nasal tissues that become inflamed or swollen. The swelling traps or blocks the flow of mucus. This allows bacteria, viruses, and fungi to grow, which leads to infection. If you were prescribed an antibiotic medicine, take it as told by your health care provider. Do not stop taking the antibiotic even if you start to feel better. Keep all follow-up visits. This is important. This information is not intended to replace advice given to you by your health care provider. Make sure you discuss any questions you have with your health care provider. Document Revised: 08/10/2021 Document Reviewed: 08/10/2021 Elsevier Patient Education  2024 [employer].    If you have been instructed to have an in-person evaluation today at a local Urgent Care facility, please use the link below. It will take you to a list of all of our available [facility], including address, phone number and hours of operation. Please do not delay care.  [facility]  If you or a family member do not have a primary care provider, use the link below to schedule a visit and establish care. When you choose a [facility] primary care physician or advanced practice provider, you gain a long-term partner in health. Find a Primary Care Provider  Learn more about [facility]'s in-office and virtual care options: [facility] - Get Care Now

## 2023-09-22 ENCOUNTER — Encounter: Payer: Self-pay | Admitting: Nurse Practitioner

## 2023-10-02 ENCOUNTER — Telehealth: Payer: Self-pay | Admitting: Nurse Practitioner

## 2023-10-02 NOTE — Telephone Encounter (Signed)
 Patient called to schedule appt for respiratory infection finished antibiotics forward to [facility]

## 2023-10-03 ENCOUNTER — Encounter: Payer: Self-pay | Admitting: Nurse Practitioner

## 2023-10-03 ENCOUNTER — Ambulatory Visit
Admission: RE | Admit: 2023-10-03 | Discharge: 2023-10-03 | Disposition: A | Discharge status: Home / Self Care | Payer: 59 | Source: Ambulatory Visit | Attending: Nurse Practitioner | Admitting: Nurse Practitioner

## 2023-10-03 ENCOUNTER — Ambulatory Visit
Admission: RE | Admit: 2023-10-03 | Discharge: 2023-10-03 | Disposition: A | Discharge status: Home / Self Care | Payer: 59 | Attending: Nurse Practitioner | Admitting: Nurse Practitioner

## 2023-10-03 ENCOUNTER — Ambulatory Visit: Payer: 59 | Admitting: Nurse Practitioner

## 2023-10-03 VITALS — BP 136/88 | HR 72 | Temp 98.3°F | Resp 16 | Ht 69.0 in | Wt 236.6 lb

## 2023-10-03 DIAGNOSIS — R0989 Other specified symptoms and signs involving the circulatory and respiratory systems: Secondary | ICD-10-CM

## 2023-10-03 DIAGNOSIS — J189 Pneumonia, unspecified organism: Secondary | ICD-10-CM

## 2023-10-03 DIAGNOSIS — R052 Subacute cough: Secondary | ICD-10-CM

## 2023-10-03 MED ORDER — LEVOFLOXACIN 500 MG PO TABS
500.0000 mg | ORAL_TABLET | Freq: Every day | ORAL | 0 refills | Status: AC
Start: 2023-10-03 — End: 2023-10-10

## 2023-10-03 MED ORDER — PREDNISONE 10 MG (21) PO TBPK
ORAL_TABLET | ORAL | 0 refills | Status: DC
Start: 2023-10-03 — End: 2023-11-13

## 2023-10-03 NOTE — Progress Notes (Signed)
 [facility] [address] [place], [place] [address]  Internal MEDICINE  Office Visit Note  Patient Name: Colleen Richard  [id number]  [id number]  Date of Service: 10/03/2023  Chief Complaint  Patient presents with   Acute Visit    Still sick want lungs checked out.      HPI Colleen Richard presents for an acute sick visit for persistent URI and probably lower respiratory infection symptoms.  --onset of symptoms was a couple of weeks ago. Did a virtual urgent care appt at the beginning of the month and was given a zpak and prednisone . This has improved some of the symptoms but still experiencing chest was pain/tenderness, chest tightness, wheezing, cough, slight SOB, and not able to cough up much sputum due to thickness of the mucous.  Denies any sore throat. Had symptoms of sinus press and sinus infection but those symptoms have resolved.  Has been taking OTC theraflu and using albuterol  inhaler and neb treatments.  Colleen Richard is have chest wall pain in the lower chest and lateral chest wall on both sides.     Current Medication:  Outpatient Encounter Medications as of 10/03/2023  Medication Sig Note   albuterol  (VENTOLIN  HFA) 108 (90 Base) MCG/ACT inhaler INHALE 2 PUFFS INTO THE LUNGS EVERY 6 HOURS AS NEEDED FOR WHEEZING OR SHORTNESS OF BREATH    aspirin-acetaminophen -caffeine (EXCEDRIN MIGRAINE) 250-250-65 MG tablet Take 2 tablets by mouth every 6 (six) hours as needed for headache.    CALCIUM  CARBONATE-VITAMIN D  PO Take 2 tablets by mouth.    fluticasone  (FLONASE ) 50 MCG/ACT nasal spray Place 2 sprays into both nostrils daily.    Fluticasone -Umeclidin-Vilant (TRELEGY ELLIPTA) 200-62.5-25 MCG/ACT AEPB Inhale into the lungs.    ipratropium-albuterol  (DUONEB) 0.5-2.5 (3) MG/3ML SOLN Take 3 mLs by nebulization every 6 (six) hours as needed. 05/03/2022: Do not send any more refills unless patient asks, Colleen Richard has many   levofloxacin  (LEVAQUIN ) 500 MG tablet Take 1 tablet (500 mg total) by  mouth daily for 7 days. Take with food.    predniSONE  (STERAPRED UNI-PAK 21 TAB) 10 MG (21) TBPK tablet Use as directed for 6 days    rosuvastatin  (CRESTOR ) 10 MG tablet Take 1 tablet (10 mg total) by mouth daily.    zolpidem  (AMBIEN ) 10 MG tablet TAKE 1 TABLET BY MOUTH AT BEDTIME AS NEEDED FOR SLEEP    [DISCONTINUED] predniSONE  (STERAPRED UNI-PAK 21 TAB) 10 MG (21) TBPK tablet Take following package directions.    No facility-administered encounter medications on file as of 10/03/2023.      Medical History: Past Medical History:  Diagnosis Date   Abnormal mammogram    Asthma    Cancer (HCC) 12/2019   breast cancer on right   Dyspnea    Hyperlipidemia    Hypertension    Menorrhagia    Migraines    Multiple thyroid  nodules 12/2019   biopsied in past. now just following with yearly ultrasounds   Personal history of radiation therapy 2021   right breast ca   Reflux esophagitis    Shortness of breath      Vital Signs: BP (!) 136/94   Pulse 72   Temp 98.3 F (36.8 C)   Resp 16   Ht 5' 9 (1.753 m)   Wt 236 lb 9.6 oz (107.3 kg)   SpO2 98%   BMI 34.94 kg/m    Review of Systems  Constitutional:  Positive for activity change and fatigue.  HENT:  Positive for congestion, postnasal drip, sinus  pressure and sinus pain.   Respiratory:  Positive for cough, chest tightness, shortness of breath and wheezing.        Chest wall pain from cough. No pain with breathing.   Cardiovascular: Negative.  Negative for chest pain and palpitations.  Gastrointestinal: Negative.   Neurological:  Positive for weakness.    Physical Exam Constitutional:      Appearance: Normal appearance. Colleen Richard is obese. Colleen Richard is ill-appearing.  HENT:     Head: Normocephalic and atraumatic.  Eyes:     Pupils: Pupils are equal, round, and reactive to light.  Cardiovascular:     Rate and Rhythm: Normal rate and regular rhythm.     Heart sounds: No murmur heard.    No friction rub. No gallop.  Pulmonary:      Effort: Pulmonary effort is normal. No accessory muscle usage or respiratory distress.     Breath sounds: Normal air entry. Examination of the right-middle field reveals decreased breath sounds. Examination of the left-middle field reveals decreased breath sounds. Examination of the right-lower field reveals rales. Examination of the left-lower field reveals rales. Decreased breath sounds and rales present.  Chest:     Chest wall: Tenderness present.  Neurological:     Mental Status: Colleen Richard is alert and oriented to person, place, and time.  Psychiatric:        Mood and Affect: Mood normal.        Behavior: Behavior normal.      Assessment/Plan: 1. Community acquired bilateral lower lobe pneumonia (Primary) Levofloxacin  and prednisone  taper prescribed, take until gone. Stat chest xray to evaluate for pneumonia  - DG Chest 2 View; Future - levofloxacin  (LEVAQUIN ) 500 MG tablet; Take 1 tablet (500 mg total) by mouth daily for 7 days. Take with food.  Dispense: 7 tablet; Refill: 0 - predniSONE  (STERAPRED UNI-PAK 21 TAB) 10 MG (21) TBPK tablet; Use as directed for 6 days  Dispense: 21 tablet; Refill: 0  2. Bibasilar crackles Chest xray ordered to evaluate for pneumonia. Levofloxacin  and prednisone  prescribed, take until gone.  - DG Chest 2 View; Future - levofloxacin  (LEVAQUIN ) 500 MG tablet; Take 1 tablet (500 mg total) by mouth daily for 7 days. Take with food.  Dispense: 7 tablet; Refill: 0 - predniSONE  (STERAPRED UNI-PAK 21 TAB) 10 MG (21) TBPK tablet; Use as directed for 6 days  Dispense: 21 tablet; Refill: 0  3. Subacute cough Stat chest xray ordered  - DG Chest 2 View; Future  General Counseling: shyniece scripter understanding of the findings of todays visit and agrees with plan of treatment. I have discussed any further diagnostic evaluation that may be needed or ordered today. We also reviewed Colleen Richard medications today. Colleen Richard has been encouraged to call the office with any questions or  concerns that should arise related to todays visit.    Counseling:     Orders Placed This Encounter  Procedures   DG Chest 2 View    Meds ordered this encounter  Medications   levofloxacin  (LEVAQUIN ) 500 MG tablet    Sig: Take 1 tablet (500 mg total) by mouth daily for 7 days. Take with food.    Dispense:  7 tablet    Refill:  0    Fill new script asap   predniSONE  (STERAPRED UNI-PAK 21 TAB) 10 MG (21) TBPK tablet    Sig: Use as directed for 6 days    Dispense:  21 tablet    Refill:  0    Return if symptoms  worsen or fail to improve.   Controlled Substance Database was reviewed by me for overdose risk score (ORS)  Time spent:30 Minutes Time spent with patient included reviewing progress notes, labs, imaging studies, and discussing plan for follow up.   This patient was seen by Mardy Maxin, FNP-C in collaboration with Dr. Sigrid Bathe as a part of collaborative care agreement.  Grey Rakestraw R. Maxin, MSN, FNP-C Internal Medicine

## 2023-11-13 ENCOUNTER — Ambulatory Visit (INDEPENDENT_AMBULATORY_CARE_PROVIDER_SITE_OTHER): Payer: 59 | Admitting: Nurse Practitioner

## 2023-11-13 ENCOUNTER — Encounter: Payer: Self-pay | Admitting: Nurse Practitioner

## 2023-11-13 VITALS — BP 130/86 | HR 74 | Temp 98.4°F | Resp 16 | Ht 69.0 in | Wt 234.4 lb

## 2023-11-13 DIAGNOSIS — Z0001 Encounter for general adult medical examination with abnormal findings: Secondary | ICD-10-CM | POA: Diagnosis not present

## 2023-11-13 DIAGNOSIS — E782 Mixed hyperlipidemia: Secondary | ICD-10-CM | POA: Diagnosis not present

## 2023-11-13 DIAGNOSIS — E559 Vitamin D deficiency, unspecified: Secondary | ICD-10-CM | POA: Diagnosis not present

## 2023-11-13 DIAGNOSIS — F5101 Primary insomnia: Secondary | ICD-10-CM

## 2023-11-13 MED ORDER — ZOLPIDEM TARTRATE 10 MG PO TABS: ORAL_TABLET | ORAL | 2 refills | Status: DC

## 2023-11-13 NOTE — Progress Notes (Signed)
 [facility] [address] [place], [place] [address]  Internal MEDICINE  Office Visit Note  Patient Name: Colleen Richard  [id number]  [id number]  Date of Service: 11/13/2023  Chief Complaint  Patient presents with   Hyperlipidemia   Hypertension   Annual Exam    HPI Abbygayle presents for an annual well visit and physical exam.  Well-appearing 58 y.o. female with  insomnia, migraines, asthma, goiter, and a history of breast cancer.  Routine CRC screening: due in 2027 Routine mammogram: scheduled to be done this week  DEXA scan: BMD scan  done in February last year Pap smear: due ion 2027 Labs: due for routine labs New or worsening pain: none  Other concerns: none    Current Medication: Outpatient Encounter Medications as of 11/13/2023  Medication Sig Note   albuterol (VENTOLIN HFA) 108 (90 Base) MCG/ACT inhaler INHALE 2 PUFFS INTO THE LUNGS EVERY 6 HOURS AS NEEDED FOR WHEEZING OR SHORTNESS OF BREATH    aspirin-acetaminophen-caffeine (EXCEDRIN MIGRAINE) 250-250-65 MG tablet Take 2 tablets by mouth every 6 (six) hours as needed for headache.    CALCIUM CARBONATE-VITAMIN D PO Take 2 tablets by mouth.    Fluticasone-Umeclidin-Vilant (TRELEGY ELLIPTA) 200-62.5-25 MCG/ACT AEPB Inhale into the lungs.    ipratropium-albuterol (DUONEB) 0.5-2.5 (3) MG/3ML SOLN Take 3 mLs by nebulization every 6 (six) hours as needed. 05/03/2022: Do not send any more refills unless patient asks, she has many   rosuvastatin (CRESTOR) 10 MG tablet Take 1 tablet (10 mg total) by mouth daily.    [DISCONTINUED] zolpidem (AMBIEN) 10 MG tablet TAKE 1 TABLET BY MOUTH AT BEDTIME AS NEEDED FOR SLEEP    zolpidem (AMBIEN) 10 MG tablet TAKE 1 TABLET BY MOUTH AT BEDTIME AS NEEDED FOR SLEEP    [DISCONTINUED] fluticasone (FLONASE) 50 MCG/ACT nasal spray Place 2 sprays into both nostrils daily.    [DISCONTINUED] predniSONE (STERAPRED UNI-PAK 21 TAB) 10 MG (21) TBPK tablet Use as directed for 6 days    No  facility-administered encounter medications on file as of 11/13/2023.    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   BREAST LUMPECTOMY WITH RADIOFREQUENCY TAG IDENTIFICATION Right 12/25/2019   Procedure: BREAST LUMPECTOMY WITH RADIOFREQUENCY TAG IDENTIFICATION;  Surgeon: Campbell Lerner, MD;  Location: [facility] ORS;  Service: General;  Laterality: Right;   CATARACT EXTRACTION, BILATERAL  01/2013, 2015   COLONOSCOPY WITH PROPOFOL N/A 11/03/2017   Procedure: COLONOSCOPY WITH PROPOFOL;  Surgeon: Pasty Spillers, MD;  Location: [facility] END[facility]COPY;  Service: Endoscopy;  Laterality: N/A;   COLONOSCOPY WITH PROPOFOL N/A 12/01/2022   Procedure: COLONOSCOPY WITH PROPOFOL;  Surgeon: Toney Reil, MD;  Location: [facility];  Service: Gastroenterology;  Laterality: N/A;   EYE SURGERY  2018   cataract and retinal detachment   EYE SURGERY Left 08/04/2021   hole in macular and detached retina right eye  05/31/2017   PARTIAL HYSTERECTOMY  2004   SENTINEL NODE BIOPSY Right 12/25/2019   Procedure: SENTINEL NODE BIOPSY;  Surgeon: Campbell Lerner, MD;  Location: [facility];  Service: General;  Laterality: Right;   TUBAL LIGATION      Medical History: Past Medical History:  Diagnosis Date  Abnormal mammogram    Asthma    Cancer (HCC) 12/2019   breast cancer on right   Dyspnea    Hyperlipidemia    Hypertension    Menorrhagia    Migraines    Multiple thyroid nodules 12/2019   biopsied in past. now just following with yearly ultrasounds   Personal history of radiation therapy 2021   right breast ca   Reflux esophagitis    Shortness of breath     Family History: Family History  Problem Relation  Age of Onset   COPD Mother    Asthma Mother    Atrial fibrillation Mother    Heart disease Father    Heart disease Brother    Heart attack Brother    Alzheimer's disease Maternal Aunt    Alzheimer's disease Maternal Grandmother    Breast cancer Other     Social History   Socioeconomic History   Marital status: Married    Spouse name: Linwood   Number of children: Not on file   Years of education: Not on file   Highest education level: Not on file  Occupational History   Not on file  Tobacco Use   Smoking status: Never   Smokeless tobacco: Never  Vaping Use   Vaping status: Never Used  Substance and Sexual Activity   Alcohol use: No   Drug use: No   Sexual activity: Yes  Other Topics Concern   Not on file  Social History Narrative   Patient lives husband in her home.   Social Drivers of Corporate investment banker Strain: Low Risk  (02/11/2023)   Received [facility], [facility]   Overall Financial Resource Strain (CARDIA)    Difficulty of Paying Living Expenses: Not hard at all  Food Insecurity: No Food Insecurity (02/11/2023)   Received from [facility], [facility]   Hunger Vital Sign    Worried About Running Out of Food in the Last Year: Never true    Ran Out of Food in the Last Year: Never true  Transportation Needs: No Transportation Needs (02/11/2023)   Received from [facility], [facility]   [facility] - Transportation    In the past 12 months, has lack of transportation kept you from medical appointments or from getting medications?: No    Lack of Transportation (Non-Medical): No  Physical Activity: Not on file  Stress: Not on file  Social Connections: Not on file  Intimate Partner Violence: Not on file      Review of Systems  Constitutional:  Negative for activity change, appetite change, chills, fatigue, fever and unexpected weight change.   HENT: Negative.  Negative for congestion, ear pain, rhinorrhea, sore throat and trouble swallowing.   Eyes: Negative.   Respiratory: Negative.  Negative for cough, chest tightness, shortness of breath and wheezing.   Cardiovascular: Negative.  Negative for chest pain and palpitations.  Gastrointestinal: Negative.  Negative for abdominal pain, blood in stool, constipation, diarrhea, nausea and vomiting.  Endocrine: Negative.   Genitourinary: Negative.  Negative for difficulty urinating, dysuria, frequency, hematuria and urgency.  Musculoskeletal: Negative.  Negative for arthralgias, back pain, joint swelling, myalgias and neck pain.  Skin: Negative.  Negative for rash and wound.  Allergic/Immunologic: Negative.  Negative for immunocompromised state.  Neurological: Negative.  Negative for dizziness, seizures, numbness and headaches.  Hematological: Negative.   Psychiatric/Behavioral: Negative.  Negative for behavioral problems, self-injury and suicidal ideas.  The patient is not nervous/anxious.     Vital Signs: BP 130/86   Pulse 74   Temp 98.4 F (36.9 C)   Resp 16   Ht 5\' 9"  (1.753 m)   Wt 234 lb 6.4 oz (106.3 kg)   SpO2 98%   BMI 34.61 kg/m    Physical Exam Vitals reviewed.  Constitutional:      General: She is awake. She is not in acute distress.    Appearance: Normal appearance. She is well-developed and well-groomed. She is obese. She is not ill-appearing or diaphoretic.  HENT:     Head: Normocephalic and atraumatic.     Right Ear: Tympanic membrane, ear canal and external ear normal.     Left Ear: Tympanic membrane, ear canal and external ear normal.     Nose: Nose normal. No congestion or rhinorrhea.     Mouth/Throat:     Lips: Pink.     Mouth: Mucous membranes are moist.     Pharynx: Oropharynx is clear. Uvula midline. No oropharyngeal exudate or posterior oropharyngeal erythema.  Eyes:     General: Lids are normal. Vision grossly intact. Gaze aligned appropriately.  No scleral icterus.       Right eye: No discharge.        Left eye: No discharge.     Extraocular Movements: Extraocular movements intact.     Conjunctiva/sclera: Conjunctivae normal.     Pupils: Pupils are equal, round, and reactive to light.     Funduscopic exam:    Right eye: Red reflex present.        Left eye: Red reflex present. Neck:     Thyroid: No thyromegaly.     Vascular: No JVD.     Trachea: Trachea and phonation normal. No tracheal deviation.  Cardiovascular:     Rate and Rhythm: Normal rate and regular rhythm.     Pulses: Normal pulses.     Heart sounds: Normal heart sounds, S1 normal and S2 normal. No murmur heard.    No friction rub. No gallop.  Pulmonary:     Effort: Pulmonary effort is normal. No accessory muscle usage or respiratory distress.     Breath sounds: Normal breath sounds and air entry. No stridor. No wheezing or rales.  Chest:     Chest wall: No tenderness.     Comments: Declined clinical breast exam, sees oncology for this and gets annual mammograms.  Abdominal:     General: Bowel sounds are normal. There is no distension.     Palpations: Abdomen is soft. There is no shifting dullness, fluid wave, mass or pulsatile mass.     Tenderness: There is no abdominal tenderness. There is no guarding or rebound.  Musculoskeletal:        General: No tenderness or deformity. Normal range of motion.     Cervical back: Normal range of motion and neck supple.     Right lower leg: No edema.     Left lower leg: No edema.  Lymphadenopathy:     Cervical: No cervical adenopathy.  Skin:    General: Skin is warm and dry.     Capillary Refill: Capillary refill takes less than 2 seconds.     Coloration: Skin is not pale.     Findings: No erythema or rash.  Neurological:     Mental Status: She is alert and oriented to person, place, and time.     Cranial Nerves: No cranial nerve deficit.     Motor: No  abnormal muscle tone.     Coordination: Coordination normal.      Gait: Gait normal.     Deep Tendon Reflexes: Reflexes are normal and symmetric.  Psychiatric:        Mood and Affect: Mood and affect normal.        Behavior: Behavior normal. Behavior is cooperative.        Thought Content: Thought content normal.        Judgment: Judgment normal.        Assessment/Plan: 1. Encounter for routine adult health examination with abnormal findings (Primary) Routine labs ordered  - CBC with Differential/Platelet - CMP14+EGFR - Vitamin D (25 hydroxy)  2. Mixed hyperlipidemia Routine labs ordered  - CBC with Differential/Platelet - CMP14+EGFR - Vitamin D (25 hydroxy)  3. Vitamin D deficiency Routine labs ordered  - CBC with Differential/Platelet - CMP14+EGFR - Vitamin D (25 hydroxy)  4. Primary insomnia Continue ambien as prescribed. Follow up in 3 months for additional refills.  - zolpidem (AMBIEN) 10 MG tablet; TAKE 1 TABLET BY MOUTH AT BEDTIME AS NEEDED FOR SLEEP  Dispense: 30 tablet; Refill: 2     General Counseling: Sawyer verbalizes understanding of the findings of todays visit and agrees with plan of treatment. I have discussed any further diagnostic evaluation that may be needed or ordered today. We also reviewed her medications today. she has been encouraged to call the office with any questions or concerns that should arise related to todays visit.    Orders Placed This Encounter  Procedures   CBC with Differential/Platelet   CMP14+EGFR   Vitamin D (25 hydroxy)    Meds ordered this encounter  Medications   zolpidem (AMBIEN) 10 MG tablet    Sig: TAKE 1 TABLET BY MOUTH AT BEDTIME AS NEEDED FOR SLEEP    Dispense:  30 tablet    Refill:  2    For future refills, due now. Please discontinue all previous orders for ambien. Fill this script today.    Return in about 3 months (around 01/31/2024) for F/U, Leighla Chestnutt PCP ambien refills. .   Total time spent:30 Minutes Time spent includes review of chart, medications, test results, and  follow up plan with the patient.   [place] Controlled Substance Database was reviewed by me.  This patient was seen by Sallyanne Kuster, FNP-C in collaboration with Dr. Beverely Risen as a part of collaborative care agreement.  Jacub Waiters R. Tedd Sias, MSN, FNP-C Internal medicine

## 2023-11-14 LAB — CBC WITH DIFFERENTIAL/PLATELET
Basophils Absolute: 0 10*3/uL (ref 0.0–0.2)
Basos: 0 %
EOS (ABSOLUTE): 0.1 10*3/uL (ref 0.0–0.4)
Eos: 1 %
Hematocrit: 45.4 % (ref 34.0–46.6)
Hemoglobin: 14.6 g/dL (ref 11.1–15.9)
Immature Grans (Abs): 0 10*3/uL (ref 0.0–0.1)
Immature Granulocytes: 0 %
Lymphocytes Absolute: 1.3 10*3/uL (ref 0.7–3.1)
Lymphs: 24 %
MCH: 28.3 pg (ref 26.6–33.0)
MCHC: 32.2 g/dL (ref 31.5–35.7)
MCV: 88 fL (ref 79–97)
Monocytes Absolute: 0.4 10*3/uL (ref 0.1–0.9)
Monocytes: 8 %
Neutrophils Absolute: 3.7 10*3/uL (ref 1.4–7.0)
Neutrophils: 67 %
Platelets: 214 10*3/uL (ref 150–450)
RBC: 5.16 x10E6/uL (ref 3.77–5.28)
RDW: 13 % (ref 11.7–15.4)
WBC: 5.5 10*3/uL (ref 3.4–10.8)

## 2023-11-14 LAB — CMP14+EGFR
ALT: 21 IU/L (ref 0–32)
AST: 20 IU/L (ref 0–40)
Albumin: 4.5 g/dL (ref 3.8–4.9)
Alkaline Phosphatase: 96 IU/L (ref 44–121)
BUN/Creatinine Ratio: 18 (ref 9–23)
BUN: 17 mg/dL (ref 6–24)
Bilirubin Total: 0.7 mg/dL (ref 0.0–1.2)
CO2: 21 mmol/L (ref 20–29)
Calcium: 9.4 mg/dL (ref 8.7–10.2)
Chloride: 103 mmol/L (ref 96–106)
Creatinine, Ser: 0.95 mg/dL (ref 0.57–1.00)
Globulin, Total: 2.3 g/dL (ref 1.5–4.5)
Glucose: 92 mg/dL (ref 70–99)
Potassium: 4.2 mmol/L (ref 3.5–5.2)
Sodium: 143 mmol/L (ref 134–144)
Total Protein: 6.8 g/dL (ref 6.0–8.5)
eGFR: 70 mL/min/{1.73_m2} (ref >59)

## 2023-11-14 LAB — VITAMIN D 25 HYDROXY (VIT D DEFICIENCY, FRACTURES): Vit D, 25-Hydroxy: 40.3 ng/mL (ref 30.0–100.0)

## 2023-11-16 ENCOUNTER — Ambulatory Visit
Admission: RE | Admit: 2023-11-16 | Discharge: 2023-11-16 | Disposition: A | Discharge status: Home / Self Care | Payer: 59 | Source: Ambulatory Visit | Attending: Oncology | Admitting: Oncology

## 2023-11-16 DIAGNOSIS — Z1231 Encounter for screening mammogram for malignant neoplasm of breast: Secondary | ICD-10-CM | POA: Insufficient documentation

## 2023-11-16 DIAGNOSIS — C50411 Malignant neoplasm of upper-outer quadrant of right female breast: Secondary | ICD-10-CM | POA: Insufficient documentation

## 2023-11-16 DIAGNOSIS — Z170 Estrogen receptor positive status [ER+]: Secondary | ICD-10-CM | POA: Diagnosis present

## 2023-12-11 ENCOUNTER — Inpatient Hospital Stay: Payer: 59 | Attending: Oncology | Admitting: Oncology

## 2023-12-11 DIAGNOSIS — Z170 Estrogen receptor positive status [ER+]: Secondary | ICD-10-CM

## 2023-12-11 DIAGNOSIS — C50411 Malignant neoplasm of upper-outer quadrant of right female breast: Secondary | ICD-10-CM

## 2023-12-11 NOTE — Progress Notes (Signed)
  [facility]  Telephone:([phone]) [phone] Fax:(336) [phone]  ID: Colleen Richard OB: 1966/02/28  MR#: [id number]  AOZ#:[id number]  Patient Care Team: Sallyanne Kuster, NP as PCP - General (Nurse Practitioner) Iran Ouch, MD as PCP - Cardiology (Cardiology) Scarlett Presto, RN (Inactive) as Oncology Nurse Navigator Jeralyn Ruths, MD as Consulting Physician (Oncology) Carmina Miller, MD as Radiation Oncologist (Radiation Oncology) Lyndon Code, MD (Internal Medicine)  I connected with Colleen Richard on 12/11/23 at  3:30 PM EDT by video enabled telemedicine visit and verified that I am speaking with the correct person using two identifiers.   I discussed the limitations, risks, security and privacy concerns of performing an evaluation and management service by telemedicine and the availability of in-person appointments. I also discussed with the patient that there may be a patient responsible charge related to this service. The patient expressed understanding and agreed to proceed.   Other persons participating in the visit and their role in the encounter: Patient, MD.  Patient's location: Car. Provider's location: Clinic.   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  INTERVAL HISTORY: Patient agreed to video-assisted telemedicine visit for her routine 83-month evaluation.  She discontinued anastrozole in September 2024 with improvement of her hot flashes.  She currently feels well and is asymptomatic.  She has no neurologic complaints.  She denies any recent fevers or illnesses.  She has a good appetite and denies weight loss.  She has no chest pain, shortness of breath, cough, or hemoptysis. She denies any nausea, vomiting, constipation, or diarrhea.  She has no urinary complaints.  Patient offers no specific complaints today.  REVIEW OF SYSTEMS:   Review of Systems   Constitutional: Negative.  Negative for diaphoresis, fever, malaise/fatigue and weight loss.  Respiratory: Negative.  Negative for cough, hemoptysis and shortness of breath.   Cardiovascular: Negative.  Negative for chest pain and leg swelling.  Gastrointestinal: Negative.  Negative for abdominal pain.  Genitourinary: Negative.  Negative for dysuria.  Musculoskeletal: Negative.  Negative for back pain and myalgias.  Skin: Negative.  Negative for rash.  Neurological: Negative.  Negative for dizziness, sensory change, focal weakness, weakness and headaches.  Psychiatric/Behavioral: Negative.  The patient is not nervous/anxious.     As per HPI. Otherwise, a complete review of systems is negative.  PAST MEDICAL HISTORY: Past Medical History:  Diagnosis Date   Abnormal mammogram    Asthma    Cancer (HCC) 12/2019   breast cancer on right   Dyspnea    Hyperlipidemia    Hypertension    Menorrhagia    Migraines    Multiple thyroid nodules 12/2019   biopsied in past. now just following with yearly ultrasounds   Personal history of radiation therapy 2021   right breast ca   Reflux esophagitis    Shortness of breath     PAST SURGICAL HISTORY: Past Surgical History:  Procedure Laterality Date   ABDOMINAL HYSTERECTOMY  2004   BREAST BIOPSY Right 08/26/2015   stereo, top hat clip    BREAST BIOPSY Right 12/10/2019   affirm, x marker,[facility]   BREAST EXCISIONAL BIOPSY Bilateral 2010   neg   BREAST LUMPECTOMY Right 12/25/2019   [facility], low grade DCIS, Negative LNs   BREAST LUMPECTOMY WITH RADIOFREQUENCY TAG IDENTIFICATION Right 12/25/2019   Procedure: BREAST LUMPECTOMY WITH RADIOFREQUENCY TAG IDENTIFICATION;  Surgeon: Campbell Lerner, MD;  Location: [facility];  Service: General;  Laterality: Right;   CATARACT EXTRACTION, BILATERAL  01/2013, 2015   COLONOSCOPY WITH PROPOFOL N/A 11/03/2017   Procedure: COLONOSCOPY WITH PROPOFOL;  Surgeon: Pasty Spillers, MD;  Location: [facility] END[facility];   Service: Endoscopy;  Laterality: N/A;   COLONOSCOPY WITH PROPOFOL N/A 12/01/2022   Procedure: COLONOSCOPY WITH PROPOFOL;  Surgeon: Toney Reil, MD;  Location: [facility];  Service: Gastroenterology;  Laterality: N/A;   EYE SURGERY  2018   cataract and retinal detachment   EYE SURGERY Left 08/04/2021   hole in macular and detached retina right eye  05/31/2017   PARTIAL HYSTERECTOMY  2004   SENTINEL NODE BIOPSY Right 12/25/2019   Procedure: SENTINEL NODE BIOPSY;  Surgeon: Campbell Lerner, MD;  Location: [facility]S;  Service: General;  Laterality: Right;   TUBAL LIGATION      FAMILY HISTORY: Family History  Problem Relation Age of Onset   COPD Mother    Asthma Mother    Atrial fibrillation Mother    Heart disease Father    Heart disease Brother    Heart attack Brother    Alzheimer's disease Maternal Aunt    Alzheimer's disease Maternal Grandmother    Breast cancer Other     ADVANCED DIRECTIVES (Y/N):  N  HEALTH MAINTENANCE: Social History   Tobacco Use   Smoking status: Never   Smokeless tobacco: Never  Vaping Use   Vaping status: Never Used  Substance Use Topics   Alcohol use: No   Drug use: No     Colonoscopy:  PAP:  Bone density:  Lipid panel:  Allergies  Allergen Reactions   Amoxicillin Nausea And Vomiting    Current Outpatient Medications  Medication Sig Dispense Refill   albuterol (VENTOLIN HFA) 108 (90 Base) MCG/ACT inhaler INHALE 2 PUFFS INTO THE LUNGS EVERY 6 HOURS AS NEEDED FOR WHEEZING OR SHORTNESS OF BREATH 8.5 g 5   aspirin-acetaminophen-caffeine (EXCEDRIN MIGRAINE) 250-250-65 MG tablet Take 2 tablets by mouth every 6 (six) hours as needed for headache.     CALCIUM CARBONATE-VITAMIN D PO Take 2 tablets by mouth.     Fluticasone-Umeclidin-Vilant (TRELEGY ELLIPTA) 200-62.5-25 MCG/ACT AEPB Inhale into the lungs.     ipratropium-albuterol (DUONEB) 0.5-2.5 (3) MG/3ML SOLN Take 3 mLs by nebulization every 6 (six) hours as needed. 360 mL 0    rosuvastatin (CRESTOR) 10 MG tablet Take 1 tablet (10 mg total) by mouth daily. 90 tablet 3   zolpidem (AMBIEN) 10 MG tablet TAKE 1 TABLET BY MOUTH AT BEDTIME AS NEEDED FOR SLEEP 30 tablet 2   No current facility-administered medications for this visit.    OBJECTIVE: There were no vitals filed for this visit.    There is no height or weight on file to calculate BMI.    ECOG FS:0 - Asymptomatic  General: Well-developed, well-nourished, no acute distress. HEENT: Normocephalic. Neuro: Alert, answering all questions appropriately. Cranial nerves grossly intact. Psych: Normal affect.  LAB RESULTS:  Lab Results  Component Value Date   NA 143 11/13/2023   K 4.2 11/13/2023   CL 103 11/13/2023   CO2 21 11/13/2023   GLUCOSE 92 11/13/2023   BUN 17 11/13/2023   CREATININE 0.95 11/13/2023   CALCIUM 9.4 11/13/2023   PROT 6.8 11/13/2023   ALBUMIN 4.5 11/13/2023   AST 20 11/13/2023   ALT 21 11/13/2023   ALKPHOS 96 11/13/2023   BILITOT 0.7 11/13/2023   GFRNONAA 59 (L) 11/06/2020   GFRAA 68 11/06/2020    Lab Results  Component Value Date  WBC 5.5 11/13/2023   NEUTROABS 3.7 11/13/2023   HGB 14.6 11/13/2023   HCT 45.4 11/13/2023   MCV 88 11/13/2023   PLT 214 11/13/2023     STUDIES: MM 3D SCREENING MAMMOGRAM BILATERAL BREAST Result Date: 11/20/2023 CLINICAL DATA:  Screening. EXAM: DIGITAL SCREENING BILATERAL MAMMOGRAM WITH TOMOSYNTHESIS AND CAD TECHNIQUE: Bilateral screening digital craniocaudal and mediolateral oblique mammograms were obtained. Bilateral screening digital breast tomosynthesis was performed. The images were evaluated with computer-aided detection. COMPARISON:  Previous exam(s). ACR Breast Density Category b: There are scattered areas of fibroglandular density. FINDINGS: There are no findings suspicious for malignancy. IMPRESSION: No mammographic evidence of malignancy. A result letter of this screening mammogram will be mailed directly to the patient. RECOMMENDATION:  Screening mammogram in one year. (Code:SM-B-01Y) BI-RADS CATEGORY  1: Negative. Electronically Signed   By: Baird Lyons M.D.   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  Given the low stage and low-grade of patient's tumor, she did not require Oncotype testing and did not require adjuvant chemotherapy.  She subsequently completed adjuvant XRT in June 2021.  Previously, FSH and LH indicated that patient is postmenopausal.  Patient could not tolerate letrozole or tamoxifen.  She was then placed on anastrozole, but discontinued treatment in September 2024 with improvement of her hot flashes.  Patient does not wish to reinitiate an aromatase inhibitor.  No further intervention is needed.  Her most recent mammogram on November 16, 2023 was reported as BI-RADS 1.  Repeat mammogram in February 2026 with video-assisted telemedicine visit 2 to 3 days later.  At this point, patient can likely be discharged from clinic.   Bone health: Patient's most recent bone mineral density on November 14, 2022 revealed a T-score of -0.4.  No intervention is needed.  Repeat bone mineral density in February 2026 as per primary care.   Hot flashes: Resolved with discontinuing treatment.  I provided 20 minutes of face-to-face video visit time during this encounter which included chart review, counseling, and coordination of care as documented above.    Patient expressed understanding and was in agreement with this plan. She also understands that She can call clinic at any time with any questions, concerns, or complaints.    Cancer Staging  Malignant neoplasm of upper-outer quadrant of right female breast [facility]) Staging Richard: Breast, AJCC 8th  Edition - Clinical stage from 01/03/2020: Stage IA (cT1b, cN0, cM0, G1, ER+, PR+, HER2-) - Signed by Jeralyn Ruths, MD on 01/03/2020 Stage prefix: Initial diagnosis Histologic grading system: 3 grade system   Jeralyn Ruths, MD   12/11/2023 5:44 PM

## 2024-01-19 ENCOUNTER — Other Ambulatory Visit: Payer: Self-pay | Admitting: Family Medicine

## 2024-01-19 DIAGNOSIS — E041 Nontoxic single thyroid nodule: Secondary | ICD-10-CM

## 2024-01-25 ENCOUNTER — Ambulatory Visit
Admission: RE | Admit: 2024-01-25 | Discharge: 2024-01-25 | Disposition: A | Discharge status: Home / Self Care | Source: Ambulatory Visit | Attending: Family Medicine | Admitting: Family Medicine

## 2024-01-25 DIAGNOSIS — E041 Nontoxic single thyroid nodule: Secondary | ICD-10-CM | POA: Insufficient documentation

## 2024-01-30 ENCOUNTER — Telehealth: Admitting: Family Medicine

## 2024-01-30 DIAGNOSIS — J069 Acute upper respiratory infection, unspecified: Secondary | ICD-10-CM

## 2024-01-30 MED ORDER — PSEUDOEPH-BROMPHEN-DM 30-2-10 MG/5ML PO SYRP
5.0000 mL | ORAL_SOLUTION | Freq: Four times a day (QID) | ORAL | 0 refills | Status: AC | PRN
Start: 2024-01-30 — End: ?

## 2024-01-30 MED ORDER — AZITHROMYCIN 250 MG PO TABS
ORAL_TABLET | ORAL | 0 refills | Status: AC
Start: 2024-02-02 — End: 2024-02-04

## 2024-01-30 NOTE — Patient Instructions (Signed)
 Sydnee Evans, thank you for joining Lanetta Pion, NP for today's virtual visit.  While this provider is not your primary care provider (PCP), if your PCP is located in our provider database this encounter information will be shared with them immediately following your visit.   A [facility] MyChart account gives you access to today's visit and all your visits, tests, and labs performed at [facility] " click here if you don't have a [facility] MyChart account or go to [email or web address]  Consent: (Patient) Colleen Richard provided verbal consent for this virtual visit at the beginning of the encounter.  Current Medications:  Current Outpatient Medications:    [START ON 02/02/2024] azithromycin  (ZITHROMAX ) 250 MG tablet, Take 2 tablets on day 1, then 1 tablet daily on days 2 through 5, Disp: 6 tablet, Rfl: 0   brompheniramine-pseudoephedrine-DM 30-2-10 MG/5ML syrup, Take 5 mLs by mouth 4 (four) times daily as needed., Disp: 120 mL, Rfl: 0   albuterol  (VENTOLIN  HFA) 108 (90 Base) MCG/ACT inhaler, INHALE 2 PUFFS INTO THE LUNGS EVERY 6 HOURS AS NEEDED FOR WHEEZING OR SHORTNESS OF BREATH, Disp: 8.5 g, Rfl: 5   aspirin-acetaminophen -caffeine (EXCEDRIN MIGRAINE) 250-250-65 MG tablet, Take 2 tablets by mouth every 6 (six) hours as needed for headache., Disp: , Rfl:    CALCIUM  CARBONATE-VITAMIN D  PO, Take 2 tablets by mouth., Disp: , Rfl:    Fluticasone -Umeclidin-Vilant (TRELEGY ELLIPTA) 200-62.5-25 MCG/ACT AEPB, Inhale into the lungs., Disp: , Rfl:    ipratropium-albuterol  (DUONEB) 0.5-2.5 (3) MG/3ML SOLN, Take 3 mLs by nebulization every 6 (six) hours as needed., Disp: 360 mL, Rfl: 0   rosuvastatin  (CRESTOR ) 10 MG tablet, Take 1 tablet (10 mg total) by mouth daily., Disp: 90 tablet, Rfl: 3   zolpidem  (AMBIEN ) 10 MG tablet, TAKE 1 TABLET BY MOUTH AT BEDTIME AS NEEDED FOR SLEEP, Disp: 30 tablet, Rfl: 2   Medications ordered in this encounter:  Meds ordered this encounter   Medications   azithromycin  (ZITHROMAX ) 250 MG tablet    Sig: Take 2 tablets on day 1, then 1 tablet daily on days 2 through 5    Dispense:  6 tablet    Refill:  0    Supervising Provider:   LAMPTEY, PHILIP O [[id number]]   brompheniramine-pseudoephedrine-DM 30-2-10 MG/5ML syrup    Sig: Take 5 mLs by mouth 4 (four) times daily as needed.    Dispense:  120 mL    Refill:  0    Supervising Provider:   Corine Dice [[id number]]     *If you need refills on other medications prior to your next appointment, please contact your pharmacy*  Follow-Up: Call back or seek an in-person evaluation if the symptoms worsen or if the condition fails to improve as anticipated.  [facility] [phone]  Other Instructions  Increased rest - Increasing Fluids - Acetaminophen  / ibuprofen  as needed for fever/pain.  - Salt water gargling, chloraseptic spray and throat lozenges - Saline nasal spray if congestion or if nasal passages feel dry. - Humidifying the air.    If you have been instructed to have an in-person evaluation today at a local Urgent Care facility, please use the link below. It will take you to a list of all of our available [facility], including address, phone number and hours of operation. Please do not delay care.  [facility]  If you or a family member do not have a primary care provider, use the link  below to schedule a visit and establish care. When you choose a [facility] primary care physician or advanced practice provider, you gain a long-term partner in health. Find a Primary Care Provider  Learn more about [facility]'s in-office and virtual care options: [facility] - Get Care Now

## 2024-01-30 NOTE — Progress Notes (Signed)
 Virtual Visit Consent   Colleen Richard, you are scheduled for a virtual visit with a [facility] provider today. Just as with appointments in the office, your consent must be obtained to participate. Your consent will be active for this visit and any virtual visit you may have with one of our providers in the next 365 days. If you have a MyChart account, a copy of this consent can be sent to you electronically.  As this is a virtual visit, video technology does not allow for your provider to perform a traditional examination. This may limit your provider's ability to fully assess your condition. If your provider identifies any concerns that need to be evaluated in person or the need to arrange testing (such as labs, EKG, etc.), we will make arrangements to do so. Although advances in technology are sophisticated, we cannot ensure that it will always work on either your end or our end. If the connection with a video visit is poor, the visit may have to be switched to a telephone visit. With either a video or telephone visit, we are not always able to ensure that we have a secure connection.  By engaging in this virtual visit, you consent to the provision of healthcare and authorize for your insurance to be billed (if applicable) for the services provided during this visit. Depending on your insurance coverage, you may receive a charge related to this service.  I need to obtain your verbal consent now. Are you willing to proceed with your visit today? Colleen Richard has provided verbal consent on 01/30/2024 for a virtual visit (video or telephone). Lanetta Pion, NP  Date: 01/30/2024 11:17 AM   Virtual Visit via Video Note   I, Lanetta Pion, connected with  Colleen Richard  ([id number], 06-21-1966) on 01/30/24 at 11:15 AM EDT by a video-enabled telemedicine application and verified that I am speaking with the correct person using two identifiers.  Location: Patient: Virtual Visit Location Patient:  Home Provider: Virtual Visit Location Provider: Home Office   I discussed the limitations of evaluation and management by telemedicine and the availability of in person appointments. The patient expressed understanding and agreed to proceed.    History of Present Illness: Colleen Richard is a 58 y.o. who identifies as a female who was assigned female at birth, and is being seen today for URI  .  HPI: URI  This is a new problem. The current episode started in the past 7 days. The problem has been gradually worsening. There has been no fever. Associated symptoms include congestion, coughing, headaches, a plugged ear sensation, rhinorrhea, sinus pain, sneezing, a sore throat and wheezing. Pertinent negatives include no abdominal pain, chest pain, diarrhea, dysuria, ear pain, joint pain, joint swelling, nausea, neck pain, rash, swollen glands or vomiting. Treatments tried: thera flu, and cough meds. The treatment provided no relief.    Problems:  Patient Active Problem List   Diagnosis Date Noted   History of colonic polyps 12/01/2022   Polyp of ascending colon 12/01/2022   Polyp of transverse colon 12/01/2022   Polyp of sigmoid colon 12/01/2022   Genetic testing 05/03/2022   Malignant neoplasm of upper-outer quadrant of right female breast (HCC) 12/14/2019   Encounter for general adult medical examination with abnormal findings 10/23/2019   Mild intermittent asthma without complication 10/23/2019   BMI 29.0-29.9,adult 10/23/2019   Dysuria 10/23/2019   Migraines 10/09/2018   Body mass index (BMI) of 31.0 to 31.9 in adult  10/09/2018   Acute non-recurrent pansinusitis 08/08/2018   Goiter diffuse, nontoxic 05/13/2018   Menopausal and perimenopausal disorder 05/13/2018   Vitamin D  deficiency 05/13/2018   Primary osteoarthritis of right shoulder 05/13/2018   Obesity (BMI 30.0-34.9) 12/19/2017   DDD (degenerative disc disease), cervical 12/19/2017   Screening for breast cancer    Benign  neoplasm of ascending colon    Rectal inflammation    Acne 11/20/2012   Bronchospasm 11/20/2012   Family history of ischemic heart disease 11/20/2012   Insomnia 11/20/2012    Allergies:  Allergies  Allergen Reactions   Amoxicillin  Nausea And Vomiting   Medications:  Current Outpatient Medications:    albuterol  (VENTOLIN  HFA) 108 (90 Base) MCG/ACT inhaler, INHALE 2 PUFFS INTO THE LUNGS EVERY 6 HOURS AS NEEDED FOR WHEEZING OR SHORTNESS OF BREATH, Disp: 8.5 g, Rfl: 5   aspirin-acetaminophen -caffeine (EXCEDRIN MIGRAINE) 250-250-65 MG tablet, Take 2 tablets by mouth every 6 (six) hours as needed for headache., Disp: , Rfl:    CALCIUM  CARBONATE-VITAMIN D  PO, Take 2 tablets by mouth., Disp: , Rfl:    Fluticasone -Umeclidin-Vilant (TRELEGY ELLIPTA) 200-62.5-25 MCG/ACT AEPB, Inhale into the lungs., Disp: , Rfl:    ipratropium-albuterol  (DUONEB) 0.5-2.5 (3) MG/3ML SOLN, Take 3 mLs by nebulization every 6 (six) hours as needed., Disp: 360 mL, Rfl: 0   rosuvastatin  (CRESTOR ) 10 MG tablet, Take 1 tablet (10 mg total) by mouth daily., Disp: 90 tablet, Rfl: 3   zolpidem  (AMBIEN ) 10 MG tablet, TAKE 1 TABLET BY MOUTH AT BEDTIME AS NEEDED FOR SLEEP, Disp: 30 tablet, Rfl: 2  Observations/Objective: Patient is well-developed, well-nourished in no acute distress.  Resting comfortably  at home.  Head is normocephalic, atraumatic.  No labored breathing.  Speech is clear and coherent with logical content.  Patient is alert and oriented at baseline.  Congestion noted Cough present  Assessment and Plan:  1. URI with cough and congestion (Primary)  - azithromycin  (ZITHROMAX ) 250 MG tablet; Take 2 tablets on day 1, then 1 tablet daily on days 2 through 5  Dispense: 6 tablet; Refill: 0 - brompheniramine-pseudoephedrine-DM 30-2-10 MG/5ML syrup; Take 5 mLs by mouth 4 (four) times daily as needed.  Dispense: 120 mL; Refill: 0  - Increased rest - Increasing Fluids - Acetaminophen  / ibuprofen  as needed for  fever/pain.  - Salt water gargling, chloraseptic spray and throat lozenges - Saline nasal spray if congestion or if nasal passages feel dry. - Humidifying the air.     Reviewed side effects, risks and benefits of medication.    Patient acknowledged agreement and understanding of the plan.   Past Medical, Surgical, Social History, Allergies, and Medications have been Reviewed.   Follow Up Instructions: I discussed the assessment and treatment plan with the patient. The patient was provided an opportunity to ask questions and all were answered. The patient agreed with the plan and demonstrated an understanding of the instructions.  A copy of instructions were sent to the patient via MyChart unless otherwise noted below.    The patient was advised to call back or seek an in-person evaluation if the symptoms worsen or if the condition fails to improve as anticipated.    Lanetta Pion, NP

## 2024-02-01 ENCOUNTER — Ambulatory Visit: Payer: 59 | Admitting: Nurse Practitioner

## 2024-02-14 ENCOUNTER — Other Ambulatory Visit: Payer: Self-pay | Admitting: Pulmonary Disease

## 2024-02-14 DIAGNOSIS — J301 Allergic rhinitis due to pollen: Secondary | ICD-10-CM

## 2024-02-14 DIAGNOSIS — R911 Solitary pulmonary nodule: Secondary | ICD-10-CM

## 2024-02-20 ENCOUNTER — Ambulatory Visit
Admission: RE | Admit: 2024-02-20 | Discharge: 2024-02-20 | Disposition: A | Discharge status: Home / Self Care | Source: Ambulatory Visit | Attending: Pulmonary Disease | Admitting: Pulmonary Disease

## 2024-02-20 DIAGNOSIS — R911 Solitary pulmonary nodule: Secondary | ICD-10-CM | POA: Insufficient documentation

## 2024-02-20 DIAGNOSIS — J301 Allergic rhinitis due to pollen: Secondary | ICD-10-CM | POA: Diagnosis present

## 2024-02-20 MED ORDER — IOHEXOL 300 MG/ML  SOLN
75.0000 mL | Freq: Once | INTRAMUSCULAR | Status: AC | PRN
  Administered 2024-02-20: 75 mL via INTRAVENOUS

## 2024-02-26 ENCOUNTER — Telehealth: Payer: Self-pay

## 2024-02-26 NOTE — Telephone Encounter (Signed)
 [place] [address] Understanding and Predicting Breast Cancer Events after Treatment (UPBEAT)  Study Follow-up - Year 4   Colleen Richard was contacted by phone regarding the Year 4 follow-up for the above study. Patient reported doing well and denied any hospitalizations since the last follow-up call on 04/10/2023. She also denied experiencing any cardiac events, including myocardial infarction (MI), percutaneous coronary intervention (PCI), coronary artery bypass grafting (CABG), heart catheterization, cerebrovascular accident (CVA), or a diagnosis of heart failure since the last contact.   Patient confirmed receipt of the study email regarding the annual follow-up questionnaire. She has completed the [employer] questionnaire online. Patient confirmed that her email address remains the same. She was informed that the next follow-up call will be in approximately one year.    Patient stated she has no questions at this time and she knows to contact the research team with any study-related concerns in the future. Patient was thanked for her time and her continued participation to the study.   Vanna Shavers, Ph.D. Clinical Research Coordinator [phone] 02/26/2024 4:25 PM

## 2024-02-27 ENCOUNTER — Ambulatory Visit: Payer: Self-pay | Admitting: Nurse Practitioner

## 2024-04-12 ENCOUNTER — Telehealth: Payer: Self-pay

## 2024-04-12 NOTE — Telephone Encounter (Signed)
 Patient called to report LRQ pain that started yesterday, requested an appt. Offered soonest available appointment on 7/30, patient agreed. Patient denies any N/V, urinary symptoms, etc. Advised patient to go to urgent care if symptoms worsen between now and her appointment.

## 2024-04-17 ENCOUNTER — Ambulatory Visit: Admitting: Nurse Practitioner

## 2024-10-09 ENCOUNTER — Other Ambulatory Visit: Payer: Self-pay | Admitting: Oncology

## 2024-10-09 DIAGNOSIS — Z1231 Encounter for screening mammogram for malignant neoplasm of breast: Secondary | ICD-10-CM

## 2024-10-22 ENCOUNTER — Ambulatory Visit: Admitting: Cardiovascular Disease

## 2024-10-22 ENCOUNTER — Encounter: Payer: Self-pay | Admitting: Cardiovascular Disease

## 2024-10-22 VITALS — BP 140/100 | HR 71 | Ht 69.0 in | Wt 233.1 lb

## 2024-10-22 DIAGNOSIS — R072 Precordial pain: Secondary | ICD-10-CM

## 2024-10-22 DIAGNOSIS — E785 Hyperlipidemia, unspecified: Secondary | ICD-10-CM

## 2024-10-22 DIAGNOSIS — I498 Other specified cardiac arrhythmias: Secondary | ICD-10-CM | POA: Diagnosis not present

## 2024-10-22 DIAGNOSIS — I2510 Atherosclerotic heart disease of native coronary artery without angina pectoris: Secondary | ICD-10-CM | POA: Diagnosis not present

## 2024-10-22 MED ORDER — ROSUVASTATIN CALCIUM 10 MG PO TABS: 10.0000 mg | ORAL_TABLET | Freq: Every day | ORAL | 3 refills | Status: AC

## 2024-10-22 MED ORDER — METOPROLOL TARTRATE 100 MG PO TABS: ORAL_TABLET | ORAL | 0 refills | Status: AC

## 2024-10-22 NOTE — Patient Instructions (Signed)
 Medication Instructions:  RESUME the Rosuvastatin  10 mg once daily  *If you need a refill on your cardiac medications before your next appointment, please call your pharmacy*  Lab Work: Your provider would like for you to have the following labs today: BMET  If you have labs (blood work) drawn today and your tests are completely normal, you will receive your results only by: MyChart Message (if you have MyChart) OR A paper copy in the mail If you have any lab test that is abnormal or we need to change your treatment, we will call you to review the results.   Follow-Up: At [facility], you and your health needs are our priority.  As part of our continuing mission to provide you with exceptional heart care, our providers are all part of one team.  This team includes your primary Cardiologist (physician) and Advanced Practice Providers or APPs (Physician Assistants and Nurse Practitioners) who all work together to provide you with the care you need, when you need it.  Your next appointment:   1 month(s)  Provider:   You may see Deatrice Cage, MD or one of the following Advanced Practice Providers on your designated Care Team:   Lonni Meager, NP Lesley Maffucci, PA-C Bernardino Bring, PA-C Cadence Welcome, PA-C Tylene Lunch, NP Barnie Hila, NP    We recommend signing up for the patient portal called MyChart.  Sign up information is provided on this After Visit Summary.  MyChart is used to connect with patients for Virtual Visits (Telemedicine).  Patients are able to view lab/test results, encounter notes, upcoming appointments, etc.  Non-urgent messages can be sent to your provider as well.   To learn more about what you can do with MyChart, go to [email or web address].   Other Instructions   Your cardiac CT will be scheduled at one of the below locations:   [facility] [address] [place], [place] [address] [phone] (Severe contrast allergies  only)  OR   [facility] [address] [place], [place] [address] [phone]  OR   [facility] [address] [place], [place] [address] [phone]  OR   [facility]. [facility] and V[facility] Tower [address]  [place], [place] [address]  OR   [facility] [address] [place], [place] [phone]  If scheduled at [facility], please arrive at the [facility] and Children's Entrance (Entrance C2) of [facility] 30 minutes prior to test start time. You can use the FREE valet parking offered at entrance C (encouraged to control the heart rate for the test)  Proceed to the [facility] Radiology Department (first floor) to check-in and test prep.  All radiology patients and guests should use entrance C2 at [facility], accessed from [facility], even though the hospital's physical address listed is [address]  If scheduled at the Heart and Vascular Tower at [address], please enter the parking lot using the [address] entrance and use the FREE valet service at the patient drop-off area. Enter the building and check-in with registration on the main floor.  If scheduled at [facility], please arrive to the Heart and Vascular Center 15 mins early for check-in and test prep.  There is spacious parking and easy access to the radiology department from the [facility] Heart and Vascular entrance. Please enter here and check-in with the desk attendant.   If scheduled at [facility], please arrive 30 minutes early for check-in and test prep.  Please follow these instructions carefully (unless otherwise directed):  An IV will be required for this test and Nitroglycerin will be given.   On the Night Before the Test: Be sure to Drink plenty of water. Do not consume any caffeinated/decaffeinated beverages or chocolate 12 hours prior to your test. Do not take  any antihistamines 12 hours prior to your test.  On the Day of the Test: Drink plenty of water until 1 hour prior to the test. Do not eat any food 1 hour prior to test. You may take your regular medications prior to the test.  Take metoprolol  (Lopressor ) two hours prior to test. If you take Furosemide/Hydrochlorothiazide/Spironolactone/Chlorthalidone, please HOLD on the morning of the test. Patients who wear a continuous glucose monitor MUST remove the device prior to scanning. FEMALES- please wear underwire-free bra if available, avoid dresses & tight clothing  Drink plenty of water. After receiving IV contrast, you may experience a mild flushed feeling. This is normal. On occasion, you may experience a mild rash up to 24 hours after the test. This is not dangerous. If this occurs, you can take Benadryl 25 mg, Zyrtec, Claritin, or Allegra and increase your fluid intake. (Patients taking Tikosyn should avoid Benadryl, and may take Zyrtec, Claritin, or Allegra) If you experience trouble breathing, this can be serious. If it is severe call 911 IMMEDIATELY. If it is mild, please call our office.  We will call to schedule your test 2-4 weeks out understanding that some insurance companies will need an authorization prior to the service being performed.   For more information and frequently asked questions, please visit our website : [email or web address]  For non-scheduling related questions, please contact the cardiac imaging nurse navigator should you have any questions/concerns: Cardiac Imaging Nurse Navigators Direct Office Dial: [phone]   For scheduling needs, including cancellations and rescheduling, please call Brittany, [phone].  For billing questions, please call [phone].

## 2024-10-23 ENCOUNTER — Ambulatory Visit: Payer: Self-pay | Admitting: *Deleted

## 2024-10-23 LAB — BASIC METABOLIC PANEL WITH GFR
BUN/Creatinine Ratio: 19 (ref 9–23)
BUN: 18 mg/dL (ref 6–24)
CO2: 19 mmol/L — ABNORMAL LOW (ref 20–29)
Calcium: 9.4 mg/dL (ref 8.7–10.2)
Chloride: 105 mmol/L (ref 96–106)
Creatinine, Ser: 0.93 mg/dL (ref 0.57–1.00)
Glucose: 85 mg/dL (ref 70–99)
Potassium: 4.4 mmol/L (ref 3.5–5.2)
Sodium: 140 mmol/L (ref 134–144)
eGFR: 72 mL/min/{1.73_m2}

## 2024-11-07 ENCOUNTER — Ambulatory Visit

## 2024-11-13 ENCOUNTER — Encounter: Payer: 59 | Admitting: Nurse Practitioner

## 2024-11-18 ENCOUNTER — Encounter

## 2024-11-20 ENCOUNTER — Ambulatory Visit: Admitting: Physician Assistant

## 2024-11-21 ENCOUNTER — Telehealth: Admitting: Oncology
